# Patient Record
Sex: Female | Born: 1935 | Race: White | Hispanic: No | Marital: Married | Smoking: Never smoker
Health system: Southern US, Community
[De-identification: ages and names within clinical notes are randomized; demographics above are authoritative.]

## PROBLEM LIST (undated history)

## (undated) DIAGNOSIS — I639 Cerebral infarction, unspecified: Secondary | ICD-10-CM

## (undated) DIAGNOSIS — E079 Disorder of thyroid, unspecified: Secondary | ICD-10-CM

## (undated) DIAGNOSIS — E119 Type 2 diabetes mellitus without complications: Secondary | ICD-10-CM

## (undated) DIAGNOSIS — C189 Malignant neoplasm of colon, unspecified: Secondary | ICD-10-CM

## (undated) HISTORY — PX: BACK SURGERY: SHX140

## (undated) HISTORY — PX: COLON SURGERY: SHX602

## (undated) HISTORY — PX: BREAST SURGERY: SHX581

## (undated) HISTORY — PX: TONSILLECTOMY: SUR1361

---

## 2003-08-13 ENCOUNTER — Other Ambulatory Visit: Payer: Self-pay

## 2003-08-14 ENCOUNTER — Other Ambulatory Visit: Payer: Self-pay

## 2004-07-25 ENCOUNTER — Ambulatory Visit: Payer: Self-pay | Admitting: Pain Medicine

## 2004-08-09 ENCOUNTER — Ambulatory Visit: Payer: Self-pay | Admitting: Pain Medicine

## 2004-08-17 ENCOUNTER — Ambulatory Visit: Payer: Self-pay | Admitting: Pain Medicine

## 2004-09-19 ENCOUNTER — Ambulatory Visit: Payer: Self-pay | Admitting: Pain Medicine

## 2004-10-24 ENCOUNTER — Ambulatory Visit: Payer: Self-pay | Admitting: Pain Medicine

## 2004-11-24 ENCOUNTER — Ambulatory Visit: Payer: Self-pay | Admitting: Pain Medicine

## 2005-04-02 ENCOUNTER — Inpatient Hospital Stay: Payer: Self-pay | Admitting: Internal Medicine

## 2005-04-02 ENCOUNTER — Other Ambulatory Visit: Payer: Self-pay

## 2005-04-04 ENCOUNTER — Inpatient Hospital Stay: Payer: Self-pay | Admitting: Unknown Physician Specialty

## 2006-07-25 ENCOUNTER — Ambulatory Visit: Payer: Self-pay | Admitting: Gastroenterology

## 2006-08-13 ENCOUNTER — Inpatient Hospital Stay: Payer: Self-pay | Admitting: Internal Medicine

## 2006-08-13 ENCOUNTER — Other Ambulatory Visit: Payer: Self-pay

## 2006-08-28 ENCOUNTER — Ambulatory Visit: Payer: Self-pay | Admitting: Surgery

## 2006-09-05 ENCOUNTER — Inpatient Hospital Stay: Payer: Self-pay | Admitting: Surgery

## 2006-09-21 ENCOUNTER — Ambulatory Visit: Payer: Self-pay | Admitting: Internal Medicine

## 2006-10-12 ENCOUNTER — Ambulatory Visit: Payer: Self-pay | Admitting: Surgery

## 2006-10-15 ENCOUNTER — Ambulatory Visit: Payer: Self-pay | Admitting: Internal Medicine

## 2006-11-02 ENCOUNTER — Ambulatory Visit: Payer: Self-pay | Admitting: Internal Medicine

## 2006-12-01 ENCOUNTER — Ambulatory Visit: Payer: Self-pay | Admitting: Internal Medicine

## 2007-01-01 ENCOUNTER — Ambulatory Visit: Payer: Self-pay | Admitting: Internal Medicine

## 2007-01-15 ENCOUNTER — Inpatient Hospital Stay: Payer: Self-pay | Admitting: Internal Medicine

## 2007-01-15 ENCOUNTER — Other Ambulatory Visit: Payer: Self-pay

## 2007-01-31 ENCOUNTER — Ambulatory Visit: Payer: Self-pay | Admitting: Internal Medicine

## 2007-03-03 ENCOUNTER — Ambulatory Visit: Payer: Self-pay | Admitting: Internal Medicine

## 2007-04-02 ENCOUNTER — Ambulatory Visit: Payer: Self-pay | Admitting: Internal Medicine

## 2007-04-09 ENCOUNTER — Ambulatory Visit: Payer: Self-pay | Admitting: Internal Medicine

## 2007-05-02 ENCOUNTER — Ambulatory Visit: Payer: Self-pay | Admitting: Family Medicine

## 2007-05-02 ENCOUNTER — Inpatient Hospital Stay: Payer: Self-pay | Admitting: Internal Medicine

## 2007-05-03 ENCOUNTER — Ambulatory Visit: Payer: Self-pay | Admitting: Internal Medicine

## 2007-05-15 ENCOUNTER — Ambulatory Visit: Payer: Self-pay | Admitting: Internal Medicine

## 2007-05-30 ENCOUNTER — Emergency Department: Payer: Self-pay | Admitting: Emergency Medicine

## 2007-06-03 ENCOUNTER — Ambulatory Visit: Payer: Self-pay | Admitting: Internal Medicine

## 2007-07-03 ENCOUNTER — Ambulatory Visit: Payer: Self-pay | Admitting: Internal Medicine

## 2007-08-03 ENCOUNTER — Ambulatory Visit: Payer: Self-pay | Admitting: Internal Medicine

## 2007-08-22 ENCOUNTER — Ambulatory Visit: Payer: Self-pay | Admitting: Internal Medicine

## 2007-09-02 ENCOUNTER — Ambulatory Visit: Payer: Self-pay | Admitting: Internal Medicine

## 2007-10-30 ENCOUNTER — Ambulatory Visit: Payer: Self-pay | Admitting: Gastroenterology

## 2007-11-03 ENCOUNTER — Ambulatory Visit: Payer: Self-pay | Admitting: Internal Medicine

## 2007-12-01 ENCOUNTER — Ambulatory Visit: Payer: Self-pay | Admitting: Internal Medicine

## 2008-01-01 ENCOUNTER — Ambulatory Visit: Payer: Self-pay | Admitting: Internal Medicine

## 2008-01-31 ENCOUNTER — Ambulatory Visit: Payer: Self-pay | Admitting: Internal Medicine

## 2008-02-25 ENCOUNTER — Ambulatory Visit: Payer: Self-pay | Admitting: Internal Medicine

## 2008-03-02 ENCOUNTER — Ambulatory Visit: Payer: Self-pay | Admitting: Internal Medicine

## 2008-04-07 ENCOUNTER — Ambulatory Visit: Payer: Self-pay | Admitting: Internal Medicine

## 2008-05-02 ENCOUNTER — Ambulatory Visit: Payer: Self-pay | Admitting: Internal Medicine

## 2008-06-02 ENCOUNTER — Ambulatory Visit: Payer: Self-pay | Admitting: Internal Medicine

## 2008-07-01 ENCOUNTER — Ambulatory Visit: Payer: Self-pay | Admitting: Internal Medicine

## 2008-07-02 ENCOUNTER — Ambulatory Visit: Payer: Self-pay | Admitting: Internal Medicine

## 2008-08-12 ENCOUNTER — Ambulatory Visit: Payer: Self-pay | Admitting: Internal Medicine

## 2008-09-01 ENCOUNTER — Ambulatory Visit: Payer: Self-pay | Admitting: Internal Medicine

## 2008-09-30 ENCOUNTER — Ambulatory Visit: Payer: Self-pay | Admitting: Internal Medicine

## 2008-10-02 ENCOUNTER — Ambulatory Visit: Payer: Self-pay | Admitting: Internal Medicine

## 2008-11-02 ENCOUNTER — Ambulatory Visit: Payer: Self-pay | Admitting: Internal Medicine

## 2008-11-11 ENCOUNTER — Ambulatory Visit: Payer: Self-pay | Admitting: Internal Medicine

## 2008-11-30 ENCOUNTER — Ambulatory Visit: Payer: Self-pay | Admitting: Internal Medicine

## 2008-12-31 ENCOUNTER — Ambulatory Visit: Payer: Self-pay | Admitting: Internal Medicine

## 2009-01-30 ENCOUNTER — Ambulatory Visit: Payer: Self-pay | Admitting: Internal Medicine

## 2009-06-02 ENCOUNTER — Ambulatory Visit: Payer: Self-pay | Admitting: Internal Medicine

## 2009-06-30 ENCOUNTER — Ambulatory Visit: Payer: Self-pay | Admitting: Internal Medicine

## 2009-07-02 ENCOUNTER — Ambulatory Visit: Payer: Self-pay | Admitting: Internal Medicine

## 2009-09-09 ENCOUNTER — Inpatient Hospital Stay: Payer: Self-pay | Admitting: Internal Medicine

## 2009-11-30 ENCOUNTER — Ambulatory Visit: Payer: Self-pay | Admitting: Internal Medicine

## 2009-12-20 ENCOUNTER — Ambulatory Visit: Payer: Self-pay | Admitting: Internal Medicine

## 2009-12-29 ENCOUNTER — Ambulatory Visit: Payer: Self-pay | Admitting: Internal Medicine

## 2009-12-31 ENCOUNTER — Ambulatory Visit: Payer: Self-pay | Admitting: Internal Medicine

## 2010-03-18 ENCOUNTER — Inpatient Hospital Stay: Payer: Self-pay | Admitting: Internal Medicine

## 2010-06-02 ENCOUNTER — Ambulatory Visit: Payer: Self-pay | Admitting: Internal Medicine

## 2010-07-01 ENCOUNTER — Ambulatory Visit: Payer: Self-pay | Admitting: Internal Medicine

## 2010-07-02 ENCOUNTER — Ambulatory Visit: Payer: Self-pay | Admitting: Internal Medicine

## 2011-01-10 ENCOUNTER — Emergency Department: Payer: Self-pay | Admitting: Emergency Medicine

## 2011-01-12 ENCOUNTER — Inpatient Hospital Stay: Payer: Self-pay | Admitting: Internal Medicine

## 2011-01-25 ENCOUNTER — Ambulatory Visit: Payer: Self-pay | Admitting: Internal Medicine

## 2011-01-27 ENCOUNTER — Inpatient Hospital Stay: Payer: Self-pay | Admitting: Surgery

## 2011-02-13 ENCOUNTER — Ambulatory Visit: Payer: Self-pay | Admitting: Surgery

## 2011-02-22 ENCOUNTER — Inpatient Hospital Stay: Payer: Self-pay | Admitting: Internal Medicine

## 2011-02-23 LAB — CEA: CEA: 2.7 ng/mL (ref 0.0–4.7)

## 2011-03-13 ENCOUNTER — Ambulatory Visit: Payer: Self-pay | Admitting: Geriatric Medicine

## 2011-04-07 ENCOUNTER — Ambulatory Visit: Payer: Self-pay | Admitting: Internal Medicine

## 2011-04-08 LAB — CEA: CEA: 4 ng/mL (ref 0.0–4.7)

## 2011-05-03 ENCOUNTER — Ambulatory Visit: Payer: Self-pay | Admitting: Internal Medicine

## 2011-06-06 ENCOUNTER — Emergency Department: Payer: Self-pay | Admitting: *Deleted

## 2011-06-12 IMAGING — CT CT HEAD WITHOUT CONTRAST
2 series · 16 of 30 positions shown, 20 images · non-contrast
Comparison: none

REASON FOR EXAM: weakness
COMMENTS:

[Series 2: without · axial · non-contrast · 0.43mm/px · z∈[+244,+378]mm · 13 of 33 slices shown, 17 images]
[im 3/33  brain]
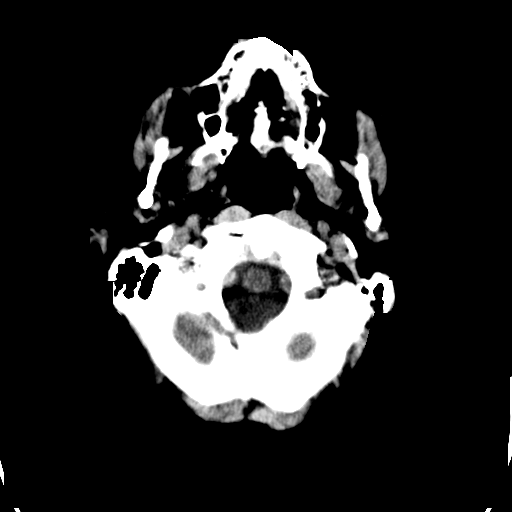
[im 3/33  bone]
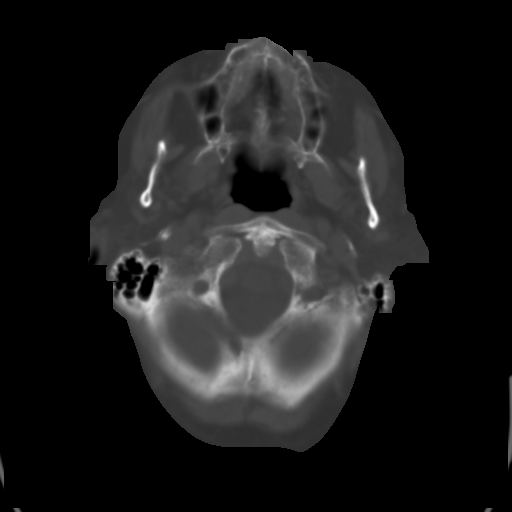
[im 5/33  brain]
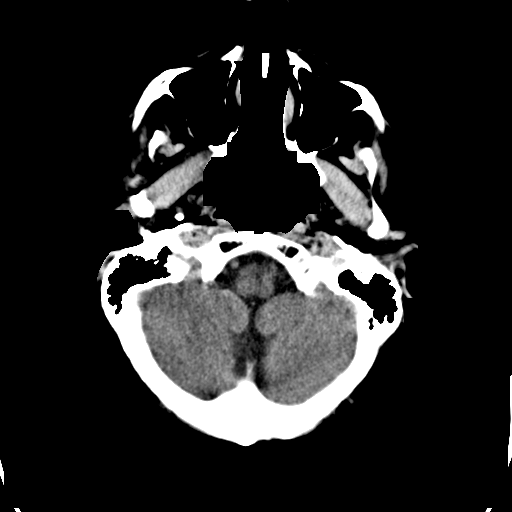
[im 7/33  brain]
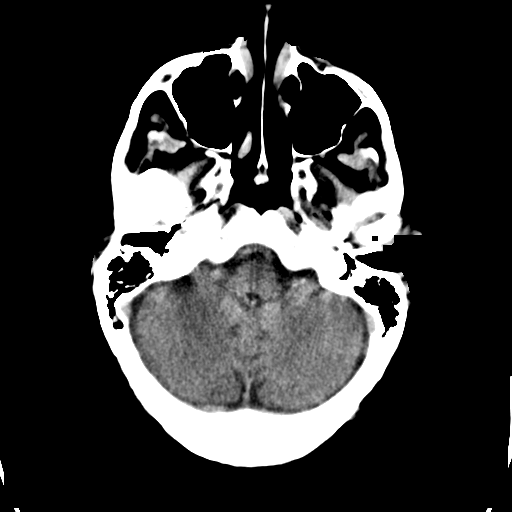
[im 10/33  brain]
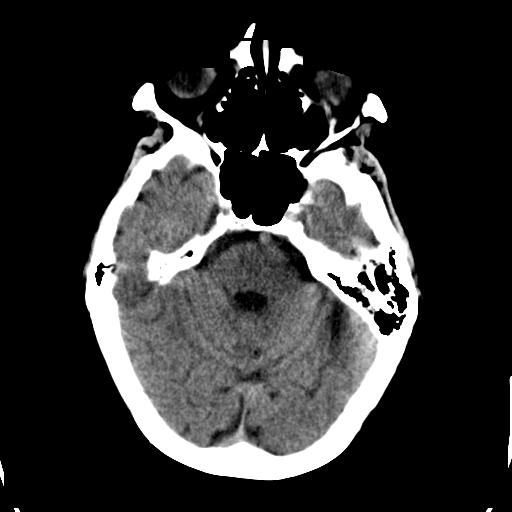
[im 12/33  brain]
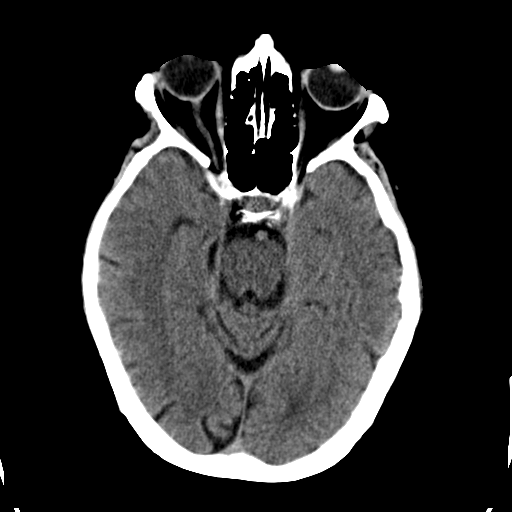
[im 12/33  bone]
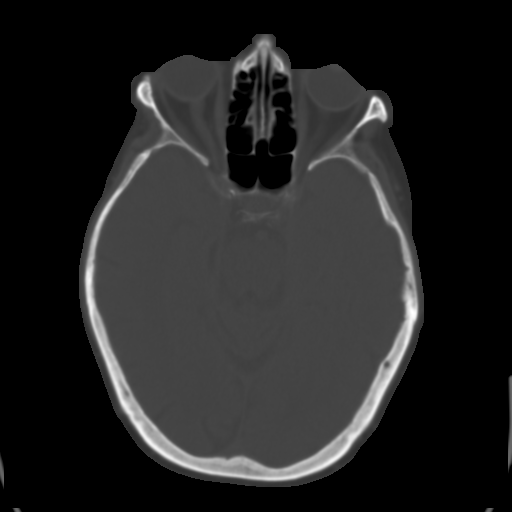
[im 14/33  brain]
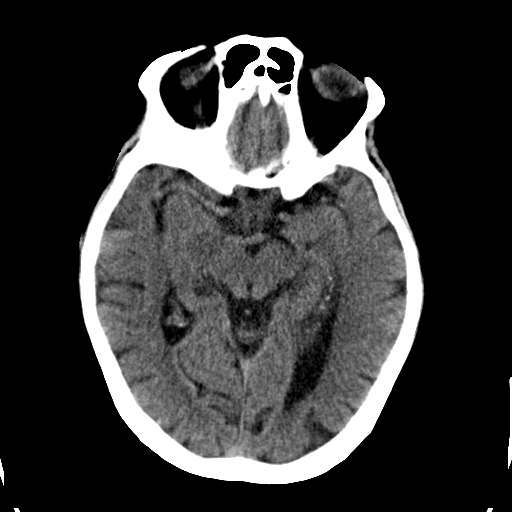
[im 17/33  brain]
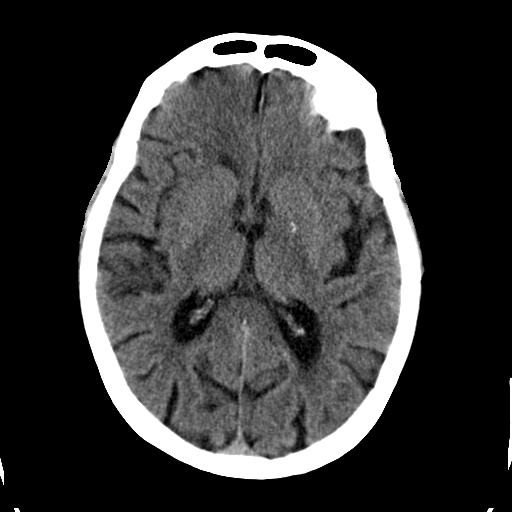
[im 19/33  brain]
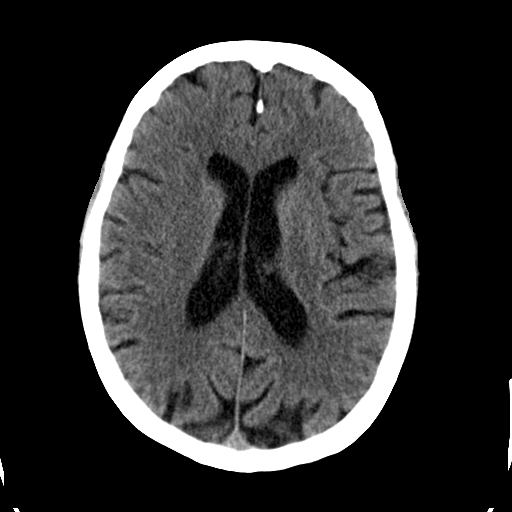
[im 21/33  brain]
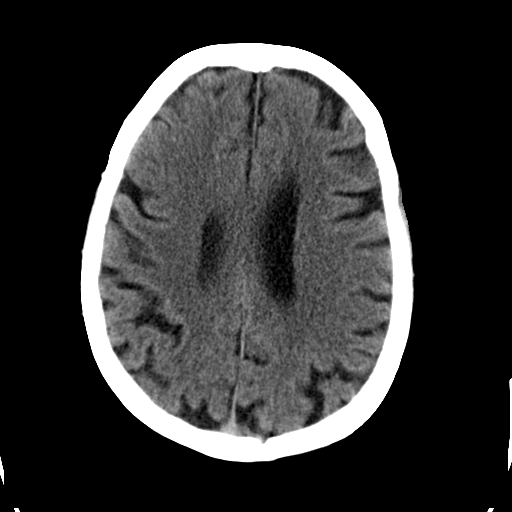
[im 21/33  bone]
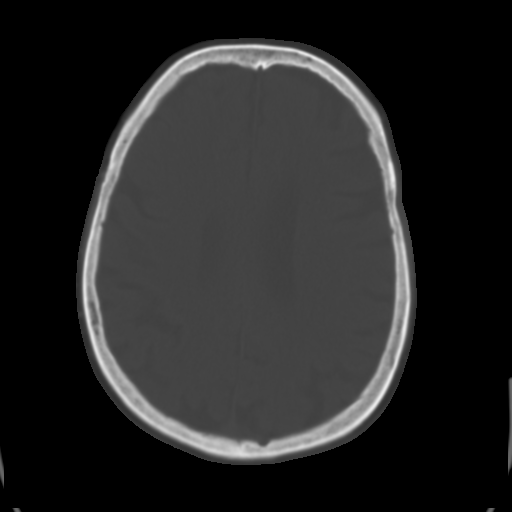
[im 23/33  brain]
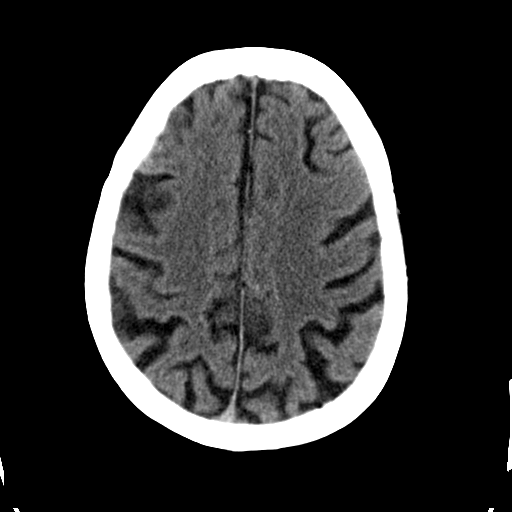
[im 26/33  brain]
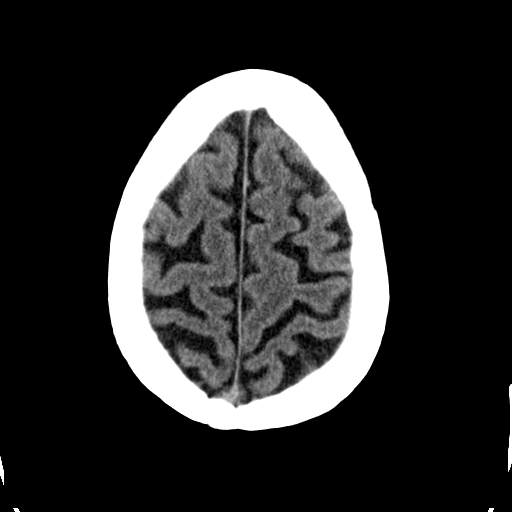
[im 28/33  brain]
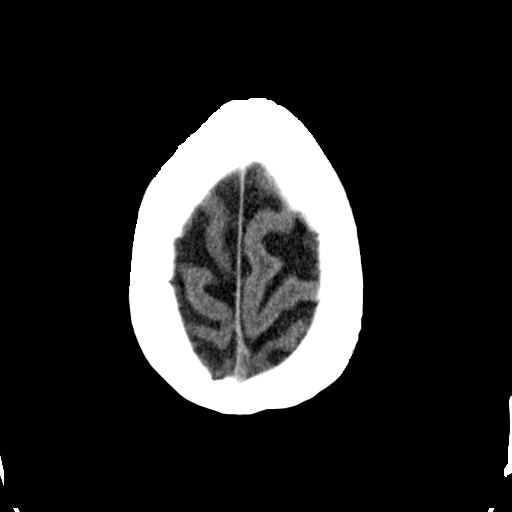
[im 30/33  brain]
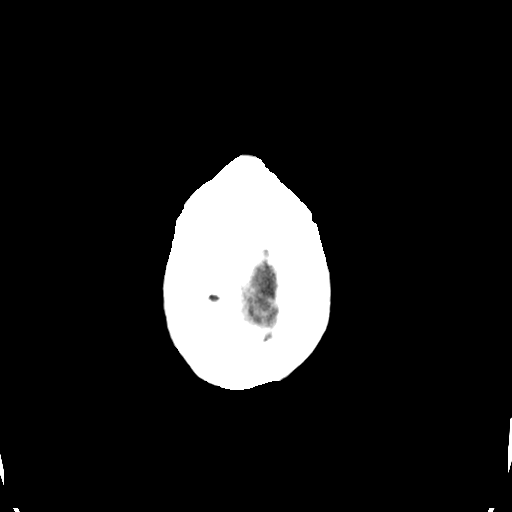
[im 30/33  bone]
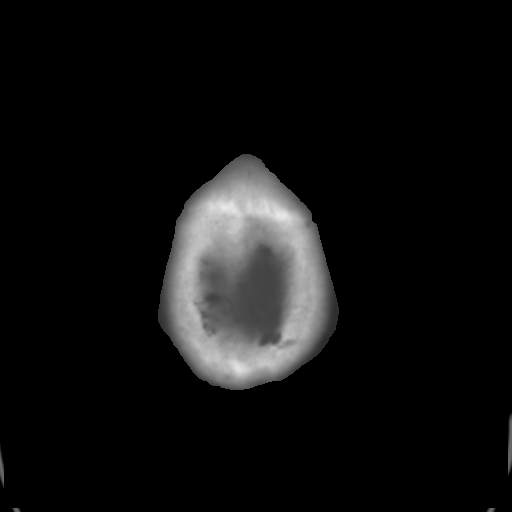

[Series 3: bone · axial · 0.43mm/px · z∈[+244,+288]mm · 3 of 33 slices shown]
[im 3/33  bone]
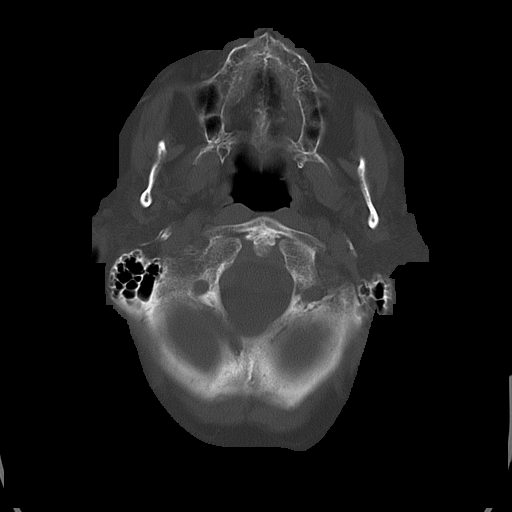
[im 7/33  bone]
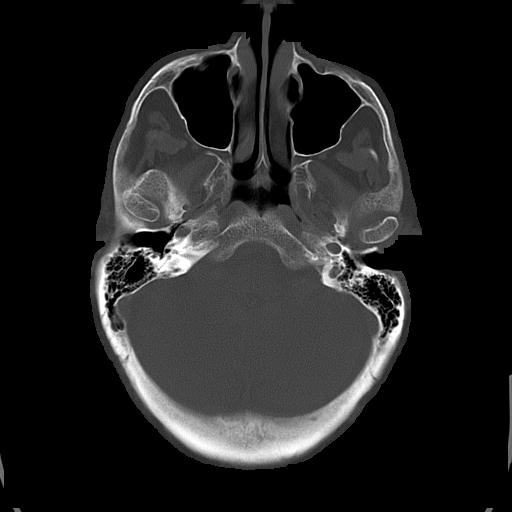
[im 12/33  bone]
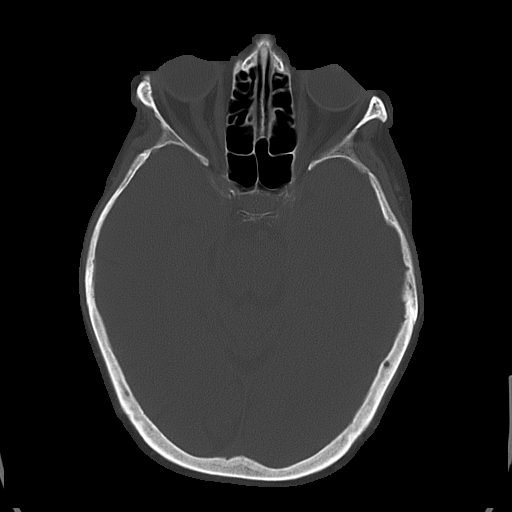

[16 of 30 positions shown; findings below may reference images not displayed]

PROCEDURE:     CT  - CT HEAD WITHOUT CONTRAST  - January 10, 2011  [DATE]

RESULT:     Noncontrast emergent CT of the brain demonstrates prominence of
the ventricles and sulci. Low-attenuation is present diffusely within the
periventricular and subcortical white matter most consistent with chronic
small vessel ischemic disease. No territorial infarct, hemorrhage, mass
effect or midline shift is evident. The included sinuses and mastoid air
cells demonstrate normal appearing aeration. No bony destruction or
depressed skull fracture is evident.
IMPRESSION: 1. Changes of atrophy with chronic small vessel ischemic disease. No acute
intracranial abnormality evident by CT at this time.

## 2011-06-14 ENCOUNTER — Emergency Department: Payer: Self-pay | Admitting: Emergency Medicine

## 2011-06-15 ENCOUNTER — Observation Stay: Payer: Self-pay | Admitting: Internal Medicine

## 2011-06-15 IMAGING — US ABDOMEN ULTRASOUND
1 series · 17 of 25 positions shown · non-contrast
Comparison: none

REASON FOR EXAM: hx of colon cancer
COMMENTS:

[Series 1: abdomen ultrasound · 17 of 60 slices shown]
[im 1/60]
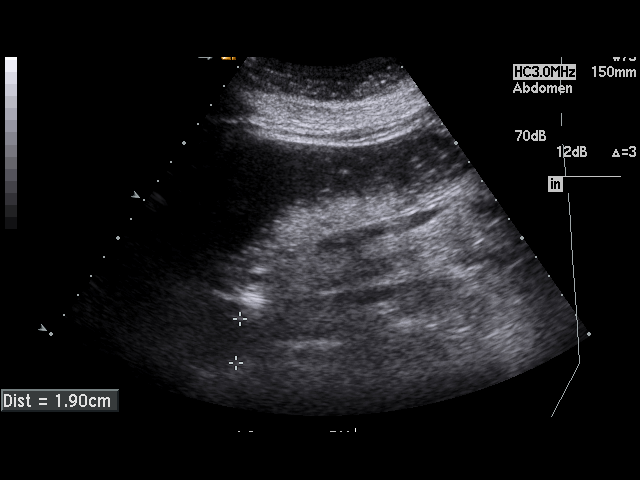
[im 5/60]
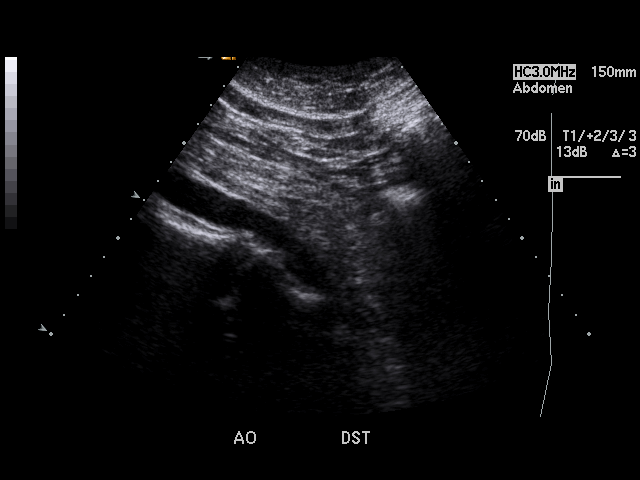
[im 8/60]
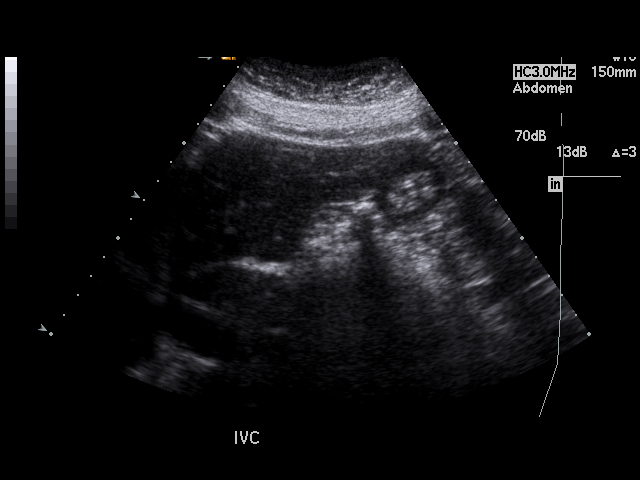
[im 13/60]
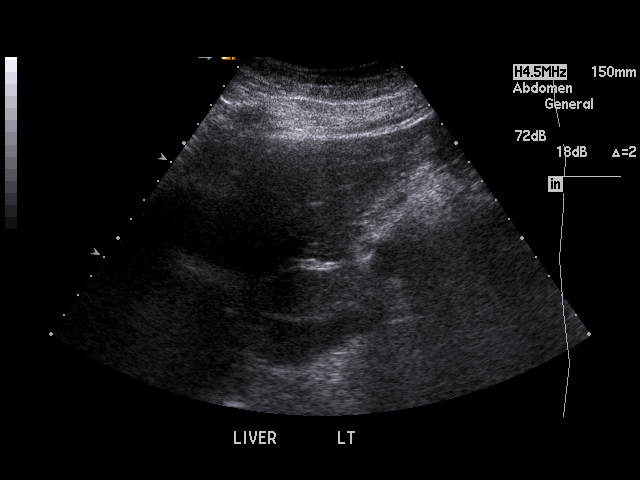
[im 15/60]
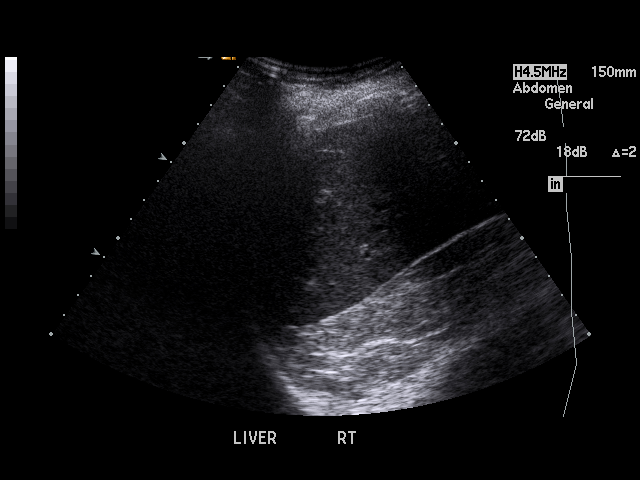
[im 20/60]
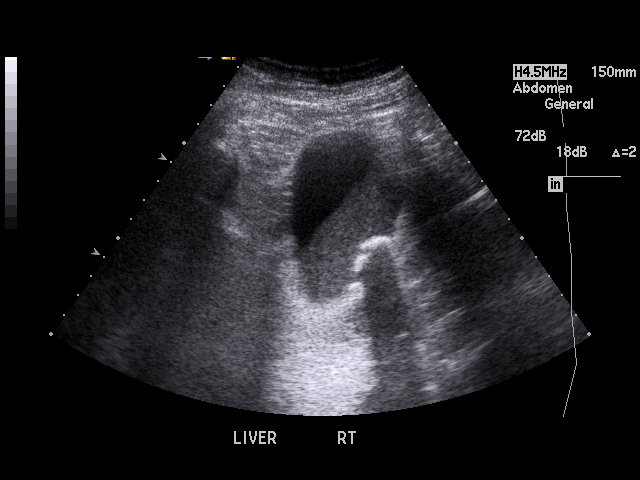
[im 23/60]
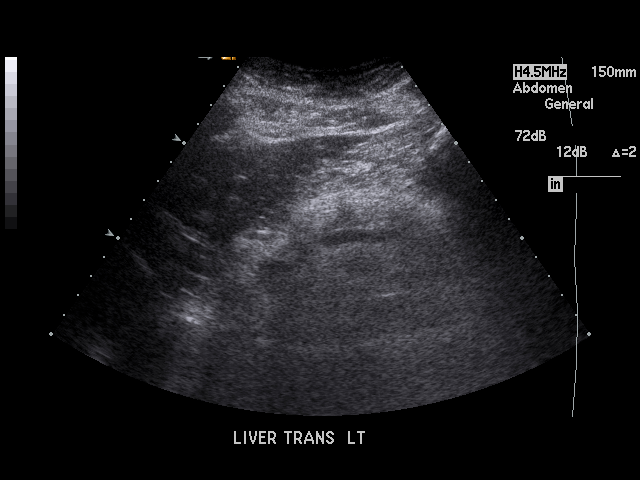
[im 28/60]
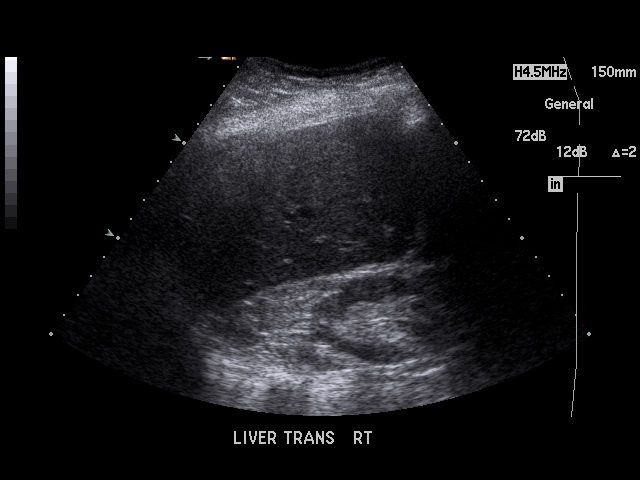
[im 30/60]
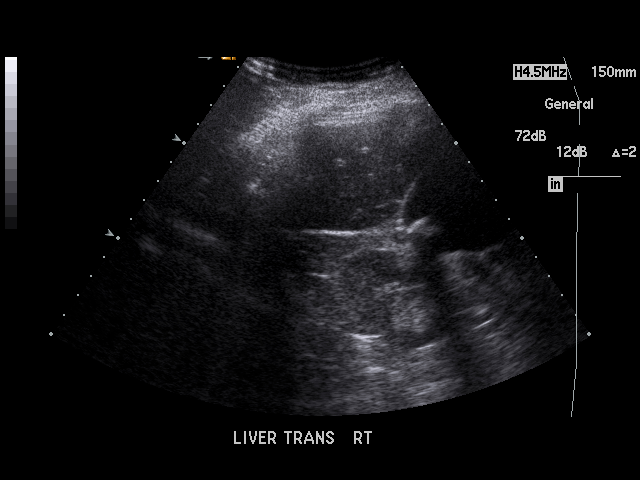
[im 32/60]
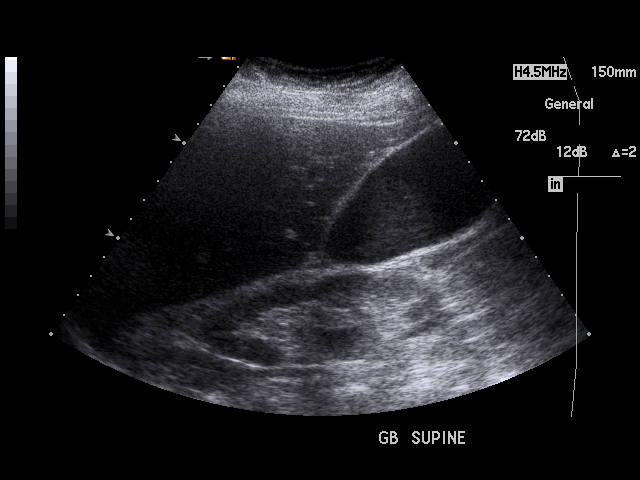
[im 37/60]
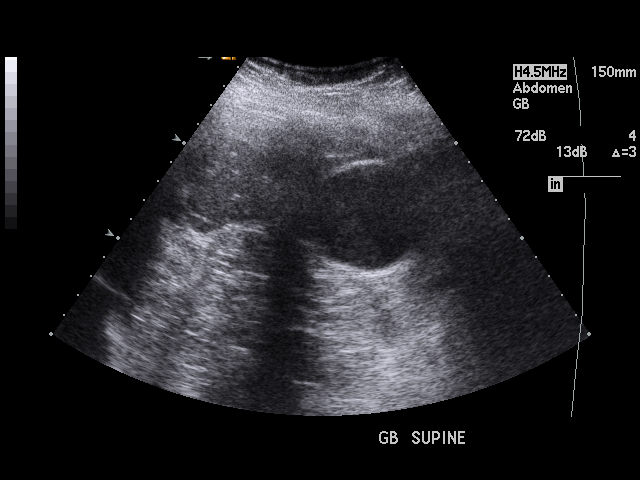
[im 40/60]
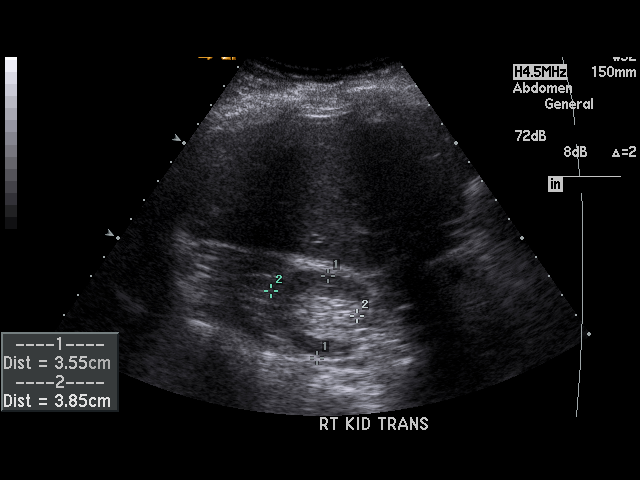
[im 45/60]
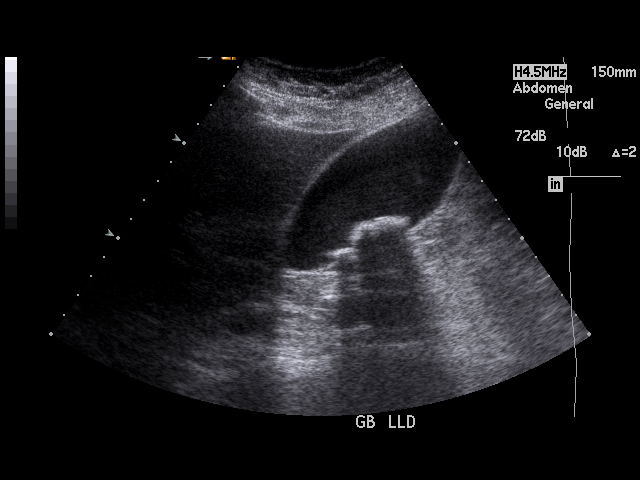
[im 47/60]
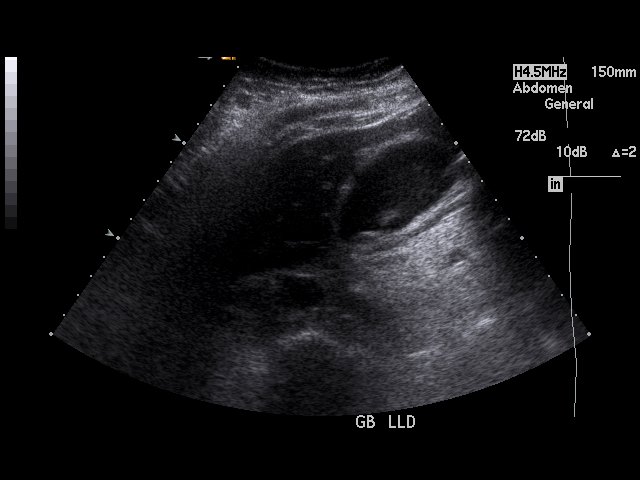
[im 52/60]
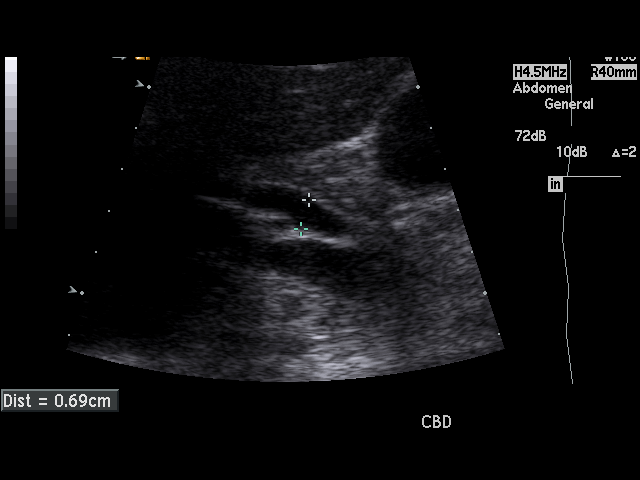
[im 55/60]
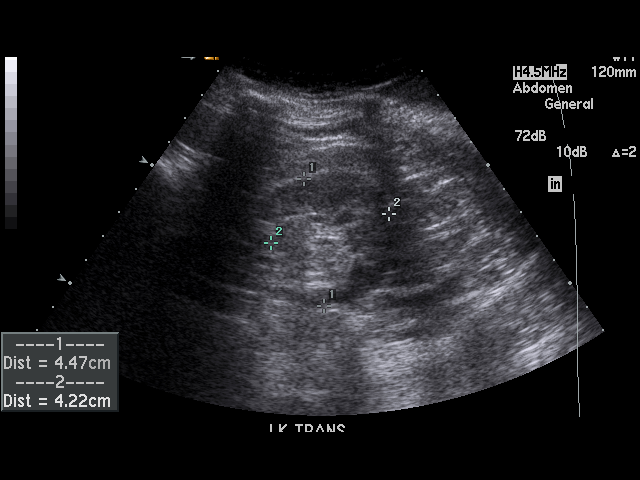
[im 60/60]
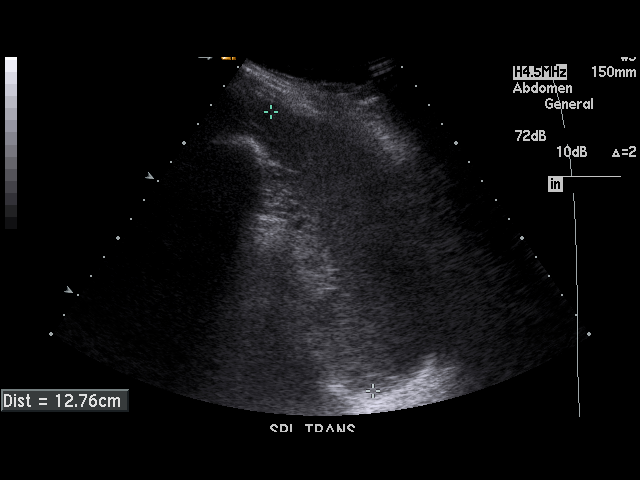

[17 of 25 positions shown; findings below may reference images not displayed]

PROCEDURE:     US  - US ABDOMEN GENERAL SURVEY  - January 13, 2011  [DATE]

RESULT:     The liver exhibits normal echotexture with no focal mass nor
ductal dilation. Portal venous flow is normal in direction toward the liver.
The gallbladder is mildly distended and contains echogenic mobile shadowing
stones as well as sludge. There is no positive sonographic Murphy's sign.
The gallbladder wall is borderline thickened at 3 mm. The common bile duct
is borderline dilated at 6.9 mm.

The pancreas, abdominal aorta, kidneys and inferior vena cava, are normal in
appearance.
IMPRESSION: There are gallstones and sludge present. The common bile
duct is top normal in size. I see no positive sonographic Murphy's sign.

## 2011-08-23 ENCOUNTER — Emergency Department: Payer: Self-pay | Admitting: Emergency Medicine

## 2011-10-25 ENCOUNTER — Ambulatory Visit: Payer: Self-pay | Admitting: Internal Medicine

## 2011-11-03 ENCOUNTER — Ambulatory Visit: Payer: Self-pay | Admitting: Oncology

## 2011-11-03 ENCOUNTER — Ambulatory Visit: Payer: Self-pay | Admitting: Internal Medicine

## 2011-11-08 LAB — COMPREHENSIVE METABOLIC PANEL
Alkaline Phosphatase: 91 U/L (ref 50–136)
Anion Gap: 10 (ref 7–16)
Bilirubin,Total: 1.1 mg/dL — ABNORMAL HIGH (ref 0.2–1.0)
Chloride: 102 mmol/L (ref 98–107)
Co2: 26 mmol/L (ref 21–32)
Creatinine: 1.16 mg/dL (ref 0.60–1.30)
Sodium: 138 mmol/L (ref 136–145)

## 2011-11-08 LAB — CBC CANCER CENTER
Eosinophil #: 0 x10 3/mm (ref 0.0–0.7)
Eosinophil %: 0 %
HCT: 36.7 % (ref 35.0–47.0)
Lymphocyte #: 1.4 x10 3/mm (ref 1.0–3.6)
MCH: 35.5 pg — ABNORMAL HIGH (ref 26.0–34.0)
MCV: 100 fL (ref 80–100)
Monocyte #: 0.4 x10 3/mm (ref 0.0–0.7)
Monocyte %: 5.5 %
Neutrophil #: 5.5 x10 3/mm (ref 1.4–6.5)
Platelet: 228 x10 3/mm (ref 150–440)
RBC: 3.69 10*6/uL — ABNORMAL LOW (ref 3.80–5.20)
RDW: 16.2 % — ABNORMAL HIGH (ref 11.5–14.5)

## 2011-11-09 LAB — CEA: CEA: 5 ng/mL — ABNORMAL HIGH (ref 0.0–4.7)

## 2011-12-01 ENCOUNTER — Ambulatory Visit: Payer: Self-pay | Admitting: Oncology

## 2011-12-01 ENCOUNTER — Ambulatory Visit: Payer: Self-pay | Admitting: Internal Medicine

## 2012-02-17 ENCOUNTER — Emergency Department: Payer: Self-pay | Admitting: *Deleted

## 2012-02-17 LAB — COMPREHENSIVE METABOLIC PANEL
Alkaline Phosphatase: 78 U/L (ref 50–136)
Anion Gap: 8 (ref 7–16)
Bilirubin,Total: 0.9 mg/dL (ref 0.2–1.0)
Chloride: 107 mmol/L (ref 98–107)
Co2: 26 mmol/L (ref 21–32)
Creatinine: 1.14 mg/dL (ref 0.60–1.30)
EGFR (African American): 53 — ABNORMAL LOW
EGFR (Non-African Amer.): 46 — ABNORMAL LOW
Glucose: 94 mg/dL (ref 65–99)
Osmolality: 284 (ref 275–301)
Potassium: 4.2 mmol/L (ref 3.5–5.1)
SGOT(AST): 24 U/L (ref 15–37)
Sodium: 141 mmol/L (ref 136–145)
Total Protein: 7 g/dL (ref 6.4–8.2)

## 2012-02-17 LAB — URINALYSIS, COMPLETE
Bilirubin,UR: NEGATIVE
Blood: NEGATIVE
Nitrite: POSITIVE
Ph: 5 (ref 4.5–8.0)
Protein: NEGATIVE
RBC,UR: <1 /HPF (ref 0–5)
Squamous Epithelial: 1
WBC UR: 1 /HPF (ref 0–5)

## 2012-02-17 LAB — CBC WITH DIFFERENTIAL/PLATELET
Basophil #: 0 10*3/uL (ref 0.0–0.1)
Basophil %: 0.1 %
Eosinophil %: 0 %
HCT: 36.3 % (ref 35.0–47.0)
HGB: 12.9 g/dL (ref 12.0–16.0)
Lymphocyte #: 1.2 10*3/uL (ref 1.0–3.6)
Lymphocyte %: 19.7 %
MCH: 35.9 pg — ABNORMAL HIGH (ref 26.0–34.0)
MCV: 101 fL — ABNORMAL HIGH (ref 80–100)
Monocyte #: 0.5 x10 3/mm (ref 0.2–0.9)
Neutrophil #: 4.1 10*3/uL (ref 1.4–6.5)
Neutrophil %: 71 %
Platelet: 182 10*3/uL (ref 150–440)
RBC: 3.6 10*6/uL — ABNORMAL LOW (ref 3.80–5.20)
RDW: 13.4 % (ref 11.5–14.5)

## 2012-02-17 LAB — PRO B NATRIURETIC PEPTIDE: B-Type Natriuretic Peptide: 126 pg/mL (ref 0–450)

## 2012-02-17 LAB — TROPONIN I: Troponin-I: <0.02 ng/mL

## 2012-03-02 DIAGNOSIS — G939 Disorder of brain, unspecified: Secondary | ICD-10-CM | POA: Insufficient documentation

## 2012-03-11 ENCOUNTER — Observation Stay: Payer: Self-pay | Admitting: Internal Medicine

## 2012-03-11 LAB — CBC
HCT: 36 % (ref 35.0–47.0)
HGB: 12.8 g/dL (ref 12.0–16.0)
MCHC: 35.5 g/dL (ref 32.0–36.0)
MCV: 100 fL (ref 80–100)
Platelet: 141 10*3/uL — ABNORMAL LOW (ref 150–440)
RBC: 3.61 10*6/uL — ABNORMAL LOW (ref 3.80–5.20)
RDW: 13.3 % (ref 11.5–14.5)
WBC: 5.2 10*3/uL (ref 3.6–11.0)

## 2012-03-11 LAB — TROPONIN I: Troponin-I: <0.02 ng/mL

## 2012-03-11 LAB — HEMOGLOBIN A1C: Hemoglobin A1C: 3.9 % — ABNORMAL LOW (ref 4.2–6.3)

## 2012-03-11 LAB — URINALYSIS, COMPLETE
Bilirubin,UR: NEGATIVE
Glucose,UR: NEGATIVE mg/dL (ref 0–75)
Ketone: NEGATIVE
Leukocyte Esterase: NEGATIVE
Nitrite: NEGATIVE
Ph: 6 (ref 4.5–8.0)
Protein: NEGATIVE
Squamous Epithelial: 1
WBC UR: NONE SEEN /HPF (ref 0–5)

## 2012-03-11 LAB — BASIC METABOLIC PANEL
Anion Gap: 8 (ref 7–16)
BUN: 19 mg/dL — ABNORMAL HIGH (ref 7–18)
Chloride: 106 mmol/L (ref 98–107)
Co2: 24 mmol/L (ref 21–32)
Creatinine: 0.89 mg/dL (ref 0.60–1.30)
EGFR (African American): >60
EGFR (Non-African Amer.): >60
Glucose: 107 mg/dL — ABNORMAL HIGH (ref 65–99)
Osmolality: 278 (ref 275–301)
Sodium: 138 mmol/L (ref 136–145)

## 2012-03-12 LAB — LIPID PANEL
Cholesterol: 188 mg/dL (ref 0–200)
Ldl Cholesterol, Calc: 118 mg/dL — ABNORMAL HIGH (ref 0–100)

## 2012-03-20 ENCOUNTER — Encounter: Payer: Self-pay | Admitting: Internal Medicine

## 2012-04-01 ENCOUNTER — Encounter: Payer: Self-pay | Admitting: Internal Medicine

## 2012-05-01 ENCOUNTER — Emergency Department: Payer: Self-pay | Admitting: Emergency Medicine

## 2012-05-01 LAB — COMPREHENSIVE METABOLIC PANEL
Albumin: 4.1 g/dL (ref 3.4–5.0)
Alkaline Phosphatase: 68 U/L (ref 50–136)
BUN: 20 mg/dL — ABNORMAL HIGH (ref 7–18)
Bilirubin,Total: 1.3 mg/dL — ABNORMAL HIGH (ref 0.2–1.0)
Chloride: 102 mmol/L (ref 98–107)
Co2: 26 mmol/L (ref 21–32)
Creatinine: 1.13 mg/dL (ref 0.60–1.30)
EGFR (Non-African Amer.): 46 — ABNORMAL LOW
SGPT (ALT): 10 U/L — ABNORMAL LOW
Sodium: 137 mmol/L (ref 136–145)

## 2012-05-01 LAB — URINALYSIS, COMPLETE
Bacteria: NONE SEEN
Bilirubin,UR: NEGATIVE
Glucose,UR: NEGATIVE mg/dL (ref 0–75)
Ketone: NEGATIVE
Leukocyte Esterase: NEGATIVE
Nitrite: NEGATIVE
RBC,UR: <1 /HPF (ref 0–5)
Specific Gravity: 1.014 (ref 1.003–1.030)
Squamous Epithelial: 1
Transitional Epi: <1
WBC UR: 1 /HPF (ref 0–5)

## 2012-05-01 LAB — CBC
HCT: 35.4 % (ref 35.0–47.0)
HGB: 13 g/dL (ref 12.0–16.0)
MCH: 36.5 pg — ABNORMAL HIGH (ref 26.0–34.0)
MCV: 99 fL (ref 80–100)
RBC: 3.56 10*6/uL — ABNORMAL LOW (ref 3.80–5.20)

## 2012-05-01 LAB — TROPONIN I: Troponin-I: <0.02 ng/mL

## 2012-05-16 ENCOUNTER — Ambulatory Visit: Payer: Self-pay | Admitting: Oncology

## 2012-05-16 LAB — CBC CANCER CENTER
Basophil %: 0.1 %
Eosinophil #: 0 x10 3/mm (ref 0.0–0.7)
Eosinophil %: 0 %
HCT: 39 % (ref 35.0–47.0)
Lymphocyte #: 1.3 x10 3/mm (ref 1.0–3.6)
MCH: 35.6 pg — ABNORMAL HIGH (ref 26.0–34.0)
MCHC: 35 g/dL (ref 32.0–36.0)
Monocyte #: 0.6 x10 3/mm (ref 0.2–0.9)
Monocyte %: 8.1 %
Neutrophil %: 74.8 %
Platelet: 220 x10 3/mm (ref 150–440)
RBC: 3.84 10*6/uL (ref 3.80–5.20)
WBC: 7.8 x10 3/mm (ref 3.6–11.0)

## 2012-05-16 LAB — COMPREHENSIVE METABOLIC PANEL
Anion Gap: 9 (ref 7–16)
Calcium, Total: 9.7 mg/dL (ref 8.5–10.1)
Chloride: 102 mmol/L (ref 98–107)
Co2: 26 mmol/L (ref 21–32)
EGFR (African American): 49 — ABNORMAL LOW
Glucose: 110 mg/dL — ABNORMAL HIGH (ref 65–99)
Potassium: 4.6 mmol/L (ref 3.5–5.1)
SGOT(AST): 17 U/L (ref 15–37)
Sodium: 137 mmol/L (ref 136–145)
Total Protein: 7.7 g/dL (ref 6.4–8.2)

## 2012-05-20 ENCOUNTER — Encounter: Payer: Self-pay | Admitting: Internal Medicine

## 2012-06-02 ENCOUNTER — Ambulatory Visit: Payer: Self-pay | Admitting: Oncology

## 2012-06-02 ENCOUNTER — Encounter: Payer: Self-pay | Admitting: Internal Medicine

## 2012-10-11 ENCOUNTER — Emergency Department: Payer: Self-pay | Admitting: Unknown Physician Specialty

## 2012-10-11 LAB — COMPREHENSIVE METABOLIC PANEL
Albumin: 4.1 g/dL (ref 3.4–5.0)
Anion Gap: 7 (ref 7–16)
Calcium, Total: 9.6 mg/dL (ref 8.5–10.1)
Co2: 25 mmol/L (ref 21–32)
Creatinine: 1.14 mg/dL (ref 0.60–1.30)
EGFR (Non-African Amer.): 46 — ABNORMAL LOW
Osmolality: 282 (ref 275–301)
Potassium: 4.5 mmol/L (ref 3.5–5.1)
Total Protein: 7.1 g/dL (ref 6.4–8.2)

## 2012-10-11 LAB — CBC
HGB: 12.9 g/dL (ref 12.0–16.0)
MCV: 98 fL (ref 80–100)
RBC: 3.75 10*6/uL — ABNORMAL LOW (ref 3.80–5.20)
RDW: 14.3 % (ref 11.5–14.5)
WBC: 6.9 10*3/uL (ref 3.6–11.0)

## 2012-10-12 LAB — URINALYSIS, COMPLETE
Bilirubin,UR: NEGATIVE
Blood: NEGATIVE
Glucose,UR: NEGATIVE mg/dL (ref 0–75)
Nitrite: NEGATIVE
Specific Gravity: 1.02 (ref 1.003–1.030)
WBC UR: 1 /HPF (ref 0–5)

## 2013-05-02 ENCOUNTER — Ambulatory Visit: Payer: Self-pay | Admitting: Oncology

## 2013-05-15 LAB — COMPREHENSIVE METABOLIC PANEL
Albumin: 4.1 g/dL (ref 3.4–5.0)
Alkaline Phosphatase: 67 U/L (ref 50–136)
Chloride: 107 mmol/L (ref 98–107)
Co2: 27 mmol/L (ref 21–32)
Creatinine: 1.31 mg/dL — ABNORMAL HIGH (ref 0.60–1.30)
EGFR (Non-African Amer.): 38 — ABNORMAL LOW
Osmolality: 285 (ref 275–301)
SGOT(AST): 12 U/L — ABNORMAL LOW (ref 15–37)
SGPT (ALT): 9 U/L — ABNORMAL LOW (ref 12–78)
Total Protein: 6.9 g/dL (ref 6.4–8.2)

## 2013-05-15 LAB — CBC CANCER CENTER
Basophil %: 0.3 %
Eosinophil #: 0 x10 3/mm (ref 0.0–0.7)
Eosinophil %: 0 %
HCT: 38.1 % (ref 35.0–47.0)
Lymphocyte #: 1.2 x10 3/mm (ref 1.0–3.6)
MCHC: 36.4 g/dL — ABNORMAL HIGH (ref 32.0–36.0)
Monocyte #: 0.6 x10 3/mm (ref 0.2–0.9)
Monocyte %: 10.3 %
Platelet: 196 x10 3/mm (ref 150–440)
RBC: 3.85 10*6/uL (ref 3.80–5.20)
RDW: 13.9 % (ref 11.5–14.5)
WBC: 6 x10 3/mm (ref 3.6–11.0)

## 2013-06-02 ENCOUNTER — Ambulatory Visit: Payer: Self-pay | Admitting: Oncology

## 2013-06-03 ENCOUNTER — Emergency Department: Payer: Self-pay | Admitting: Emergency Medicine

## 2013-06-03 LAB — URINALYSIS, COMPLETE
Blood: NEGATIVE
Glucose,UR: NEGATIVE mg/dL (ref 0–75)
Hyaline Cast: 4
Leukocyte Esterase: NEGATIVE
Nitrite: NEGATIVE
Ph: 5 (ref 4.5–8.0)
Protein: NEGATIVE
Specific Gravity: 1.021 (ref 1.003–1.030)
WBC UR: 1 /HPF (ref 0–5)

## 2013-06-03 LAB — CBC
HCT: 36 % (ref 35.0–47.0)
HGB: 13.2 g/dL (ref 12.0–16.0)
MCH: 35.4 pg — ABNORMAL HIGH (ref 26.0–34.0)
MCV: 97 fL (ref 80–100)
RBC: 3.73 10*6/uL — ABNORMAL LOW (ref 3.80–5.20)
RDW: 14.2 % (ref 11.5–14.5)
WBC: 7.3 10*3/uL (ref 3.6–11.0)

## 2013-06-03 LAB — COMPREHENSIVE METABOLIC PANEL
Albumin: 3.9 g/dL (ref 3.4–5.0)
Anion Gap: 6 — ABNORMAL LOW (ref 7–16)
BUN: 17 mg/dL (ref 7–18)
Bilirubin,Total: 1.2 mg/dL — ABNORMAL HIGH (ref 0.2–1.0)
Creatinine: 1.09 mg/dL (ref 0.60–1.30)
Glucose: 98 mg/dL (ref 65–99)
SGOT(AST): 23 U/L (ref 15–37)
SGPT (ALT): 13 U/L (ref 12–78)
Sodium: 139 mmol/L (ref 136–145)

## 2013-06-03 LAB — TROPONIN I: Troponin-I: <0.02 ng/mL

## 2013-06-04 ENCOUNTER — Ambulatory Visit: Payer: Self-pay | Admitting: Oncology

## 2013-11-11 ENCOUNTER — Emergency Department: Payer: Self-pay | Admitting: Emergency Medicine

## 2013-11-11 LAB — COMPREHENSIVE METABOLIC PANEL
ALBUMIN: 4.2 g/dL (ref 3.4–5.0)
ANION GAP: 7 (ref 7–16)
AST: 16 U/L (ref 15–37)
Alkaline Phosphatase: 72 U/L
BILIRUBIN TOTAL: 1.4 mg/dL — AB (ref 0.2–1.0)
BUN: 19 mg/dL — AB (ref 7–18)
CALCIUM: 9.5 mg/dL (ref 8.5–10.1)
CHLORIDE: 106 mmol/L (ref 98–107)
Co2: 25 mmol/L (ref 21–32)
Creatinine: 1.26 mg/dL (ref 0.60–1.30)
EGFR (African American): 47 — ABNORMAL LOW
EGFR (Non-African Amer.): 40 — ABNORMAL LOW
Glucose: 119 mg/dL — ABNORMAL HIGH (ref 65–99)
OSMOLALITY: 279 (ref 275–301)
POTASSIUM: 4 mmol/L (ref 3.5–5.1)
SGPT (ALT): 10 U/L — ABNORMAL LOW (ref 12–78)
SODIUM: 138 mmol/L (ref 136–145)
Total Protein: 7.2 g/dL (ref 6.4–8.2)

## 2013-11-11 LAB — URINALYSIS, COMPLETE
BILIRUBIN, UR: NEGATIVE
Blood: NEGATIVE
Glucose,UR: NEGATIVE mg/dL (ref 0–75)
Ketone: NEGATIVE
Leukocyte Esterase: NEGATIVE
NITRITE: POSITIVE
PH: 5 (ref 4.5–8.0)
Protein: NEGATIVE
RBC,UR: <1 /HPF (ref 0–5)
Specific Gravity: 1.018 (ref 1.003–1.030)
Squamous Epithelial: 1
WBC UR: <1 /HPF (ref 0–5)

## 2013-11-11 LAB — CBC WITH DIFFERENTIAL/PLATELET
BASOS PCT: 0.1 %
Basophil #: 0 10*3/uL (ref 0.0–0.1)
EOS ABS: 0 10*3/uL (ref 0.0–0.7)
Eosinophil %: 0 %
HCT: 38.2 % (ref 35.0–47.0)
HGB: 13.8 g/dL (ref 12.0–16.0)
LYMPHS ABS: 1 10*3/uL (ref 1.0–3.6)
LYMPHS PCT: 15.7 %
MCH: 36 pg — ABNORMAL HIGH (ref 26.0–34.0)
MCHC: 36.2 g/dL — ABNORMAL HIGH (ref 32.0–36.0)
MCV: 100 fL (ref 80–100)
Monocyte #: 0.7 x10 3/mm (ref 0.2–0.9)
Monocyte %: 11.8 %
Neutrophil #: 4.6 10*3/uL (ref 1.4–6.5)
Neutrophil %: 72.4 %
Platelet: 202 10*3/uL (ref 150–440)
RBC: 3.84 10*6/uL (ref 3.80–5.20)
RDW: 14.6 % — ABNORMAL HIGH (ref 11.5–14.5)
WBC: 6.4 10*3/uL (ref 3.6–11.0)

## 2013-11-11 LAB — TROPONIN I: Troponin-I: <0.02 ng/mL

## 2013-11-13 LAB — URINE CULTURE

## 2014-01-05 DIAGNOSIS — M76899 Other specified enthesopathies of unspecified lower limb, excluding foot: Secondary | ICD-10-CM | POA: Insufficient documentation

## 2014-03-14 ENCOUNTER — Emergency Department: Payer: Self-pay | Admitting: Emergency Medicine

## 2014-03-14 LAB — URINALYSIS, COMPLETE
Bilirubin,UR: NEGATIVE
Blood: NEGATIVE
Glucose,UR: NEGATIVE mg/dL (ref 0–75)
LEUKOCYTE ESTERASE: NEGATIVE
NITRITE: NEGATIVE
PH: 5 (ref 4.5–8.0)
PROTEIN: NEGATIVE
RBC,UR: <1 /HPF (ref 0–5)
Specific Gravity: 1.023 (ref 1.003–1.030)
Squamous Epithelial: 1
WBC UR: 1 /HPF (ref 0–5)

## 2014-03-14 LAB — CBC
HCT: 35.7 % (ref 35.0–47.0)
HGB: 12.6 g/dL (ref 12.0–16.0)
MCH: 35.8 pg — ABNORMAL HIGH (ref 26.0–34.0)
MCHC: 35.3 g/dL (ref 32.0–36.0)
MCV: 102 fL — AB (ref 80–100)
Platelet: 176 10*3/uL (ref 150–440)
RBC: 3.52 10*6/uL — ABNORMAL LOW (ref 3.80–5.20)
RDW: 13.9 % (ref 11.5–14.5)
WBC: 8.7 10*3/uL (ref 3.6–11.0)

## 2014-03-14 LAB — COMPREHENSIVE METABOLIC PANEL
ALK PHOS: 85 U/L
ALT: 20 U/L (ref 12–78)
Albumin: 4 g/dL (ref 3.4–5.0)
Anion Gap: 11 (ref 7–16)
BUN: 18 mg/dL (ref 7–18)
Bilirubin,Total: 1.6 mg/dL — ABNORMAL HIGH (ref 0.2–1.0)
CALCIUM: 9.3 mg/dL (ref 8.5–10.1)
Chloride: 101 mmol/L (ref 98–107)
Co2: 23 mmol/L (ref 21–32)
Creatinine: 1.16 mg/dL (ref 0.60–1.30)
GFR CALC AF AMER: 51 — AB
GFR CALC NON AF AMER: 44 — AB
Glucose: 131 mg/dL — ABNORMAL HIGH (ref 65–99)
Osmolality: 274 (ref 275–301)
Potassium: 4.3 mmol/L (ref 3.5–5.1)
SGOT(AST): 58 U/L — ABNORMAL HIGH (ref 15–37)
Sodium: 135 mmol/L — ABNORMAL LOW (ref 136–145)
Total Protein: 7.3 g/dL (ref 6.4–8.2)

## 2014-03-14 LAB — LIPASE, BLOOD: Lipase: 109 U/L (ref 73–393)

## 2014-03-14 LAB — TROPONIN I

## 2014-05-18 ENCOUNTER — Ambulatory Visit: Payer: Self-pay | Admitting: Oncology

## 2014-05-18 LAB — CBC CANCER CENTER
Basophil #: 0 x10 3/mm (ref 0.0–0.1)
Basophil %: 0.1 %
EOS PCT: 0 %
Eosinophil #: 0 x10 3/mm (ref 0.0–0.7)
HCT: 39.9 % (ref 35.0–47.0)
HGB: 14 g/dL (ref 12.0–16.0)
Lymphocyte #: 1.3 x10 3/mm (ref 1.0–3.6)
Lymphocyte %: 17.8 %
MCH: 35.1 pg — ABNORMAL HIGH (ref 26.0–34.0)
MCHC: 35 g/dL (ref 32.0–36.0)
MCV: 100 fL (ref 80–100)
MONOS PCT: 11.2 %
Monocyte #: 0.8 x10 3/mm (ref 0.2–0.9)
Neutrophil #: 5 x10 3/mm (ref 1.4–6.5)
Neutrophil %: 70.9 %
Platelet: 226 x10 3/mm (ref 150–440)
RBC: 3.98 10*6/uL (ref 3.80–5.20)
RDW: 13.8 % (ref 11.5–14.5)
WBC: 7 x10 3/mm (ref 3.6–11.0)

## 2014-05-18 LAB — COMPREHENSIVE METABOLIC PANEL
ANION GAP: 10 (ref 7–16)
Albumin: 4.3 g/dL (ref 3.4–5.0)
Alkaline Phosphatase: 71 U/L
BUN: 22 mg/dL — ABNORMAL HIGH (ref 7–18)
Bilirubin,Total: 1.7 mg/dL — ABNORMAL HIGH (ref 0.2–1.0)
Calcium, Total: 9.3 mg/dL (ref 8.5–10.1)
Chloride: 102 mmol/L (ref 98–107)
Co2: 25 mmol/L (ref 21–32)
Creatinine: 1.28 mg/dL (ref 0.60–1.30)
EGFR (African American): 45 — ABNORMAL LOW
GFR CALC NON AF AMER: 39 — AB
Glucose: 102 mg/dL — ABNORMAL HIGH (ref 65–99)
OSMOLALITY: 277 (ref 275–301)
Potassium: 5 mmol/L (ref 3.5–5.1)
SGOT(AST): 16 U/L (ref 15–37)
SGPT (ALT): 13 U/L — ABNORMAL LOW
Sodium: 137 mmol/L (ref 136–145)
TOTAL PROTEIN: 7.4 g/dL (ref 6.4–8.2)

## 2014-06-02 ENCOUNTER — Ambulatory Visit: Payer: Self-pay | Admitting: Oncology

## 2014-08-14 IMAGING — CR DG ABDOMEN 3V
1 series · 4 of 4 positions shown · non-contrast
Comparison: CT abdomen and pelvis 11/15/2013.

CLINICAL DATA: Abdominal pain.  Vomiting.  Fever.

EXAM:
ABDOMEN SERIES

[Series 1: x chest ap · 0.14mm/px · 4 of 4 slices shown]
[im 1/4]
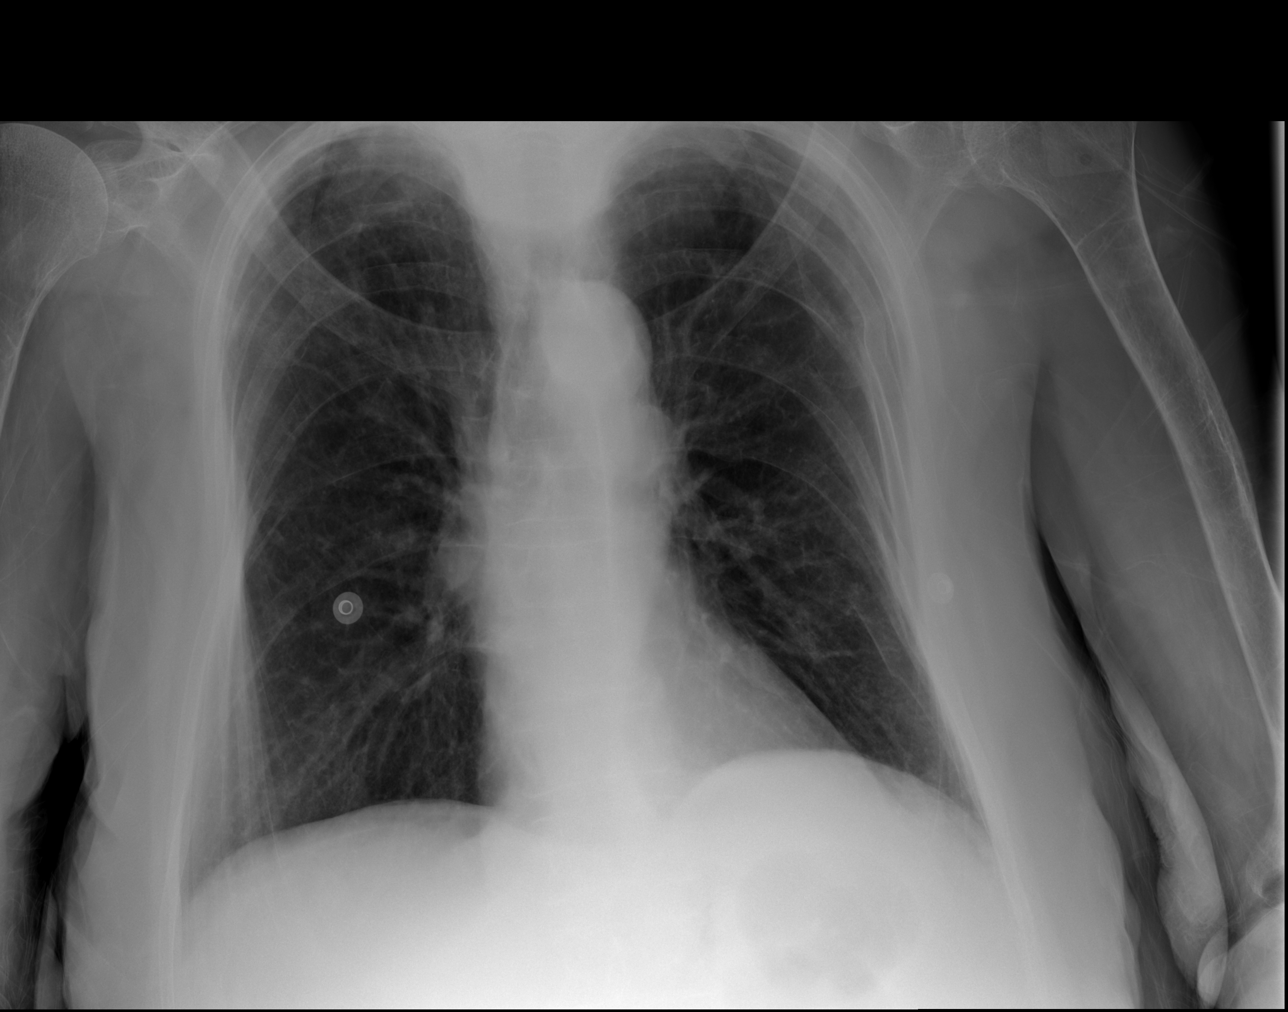
[im 2/4]
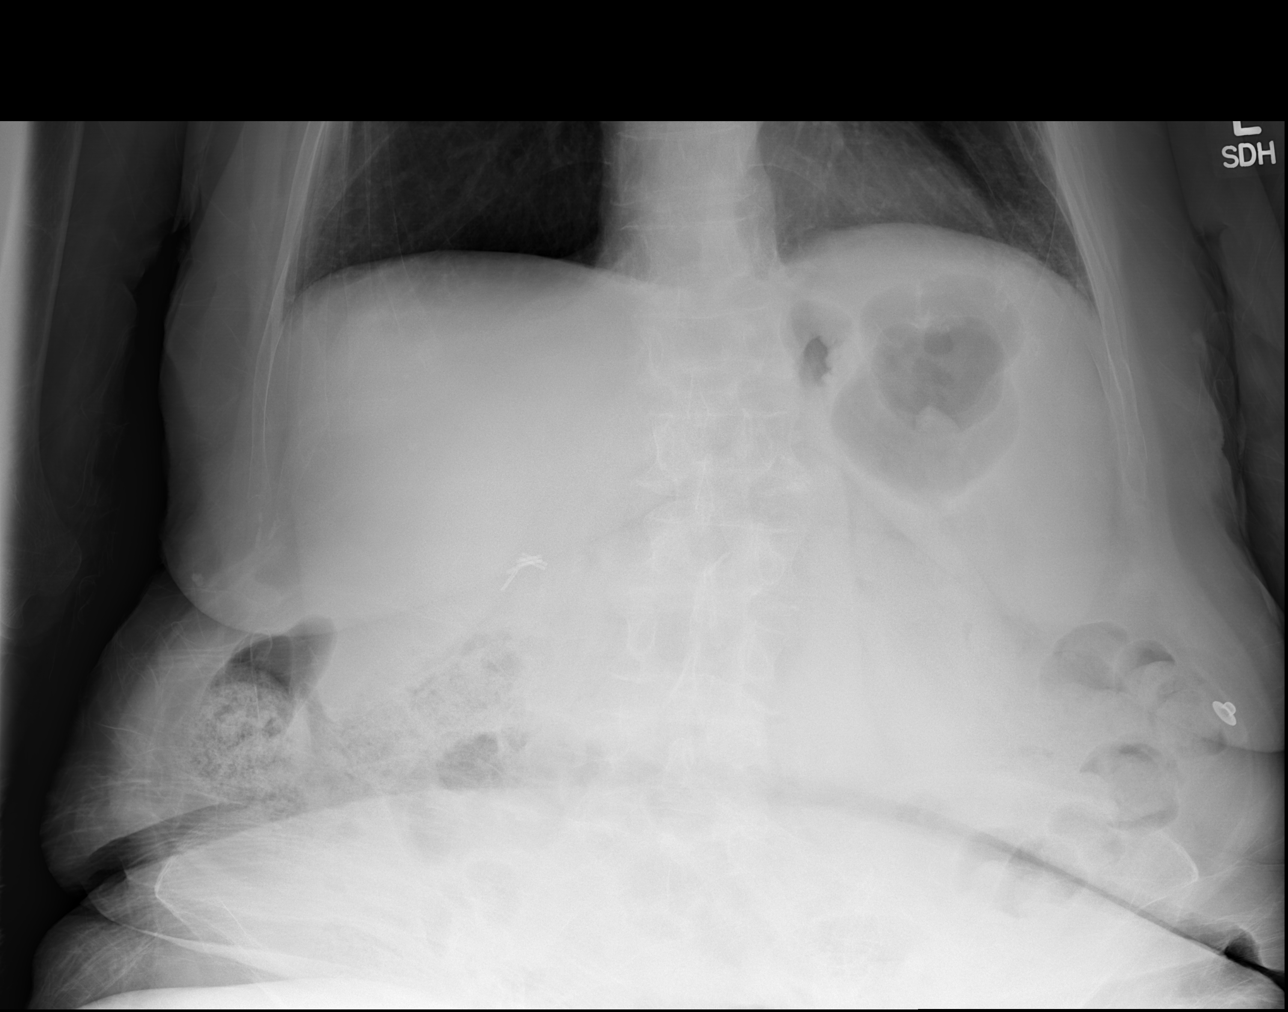
[im 3/4]
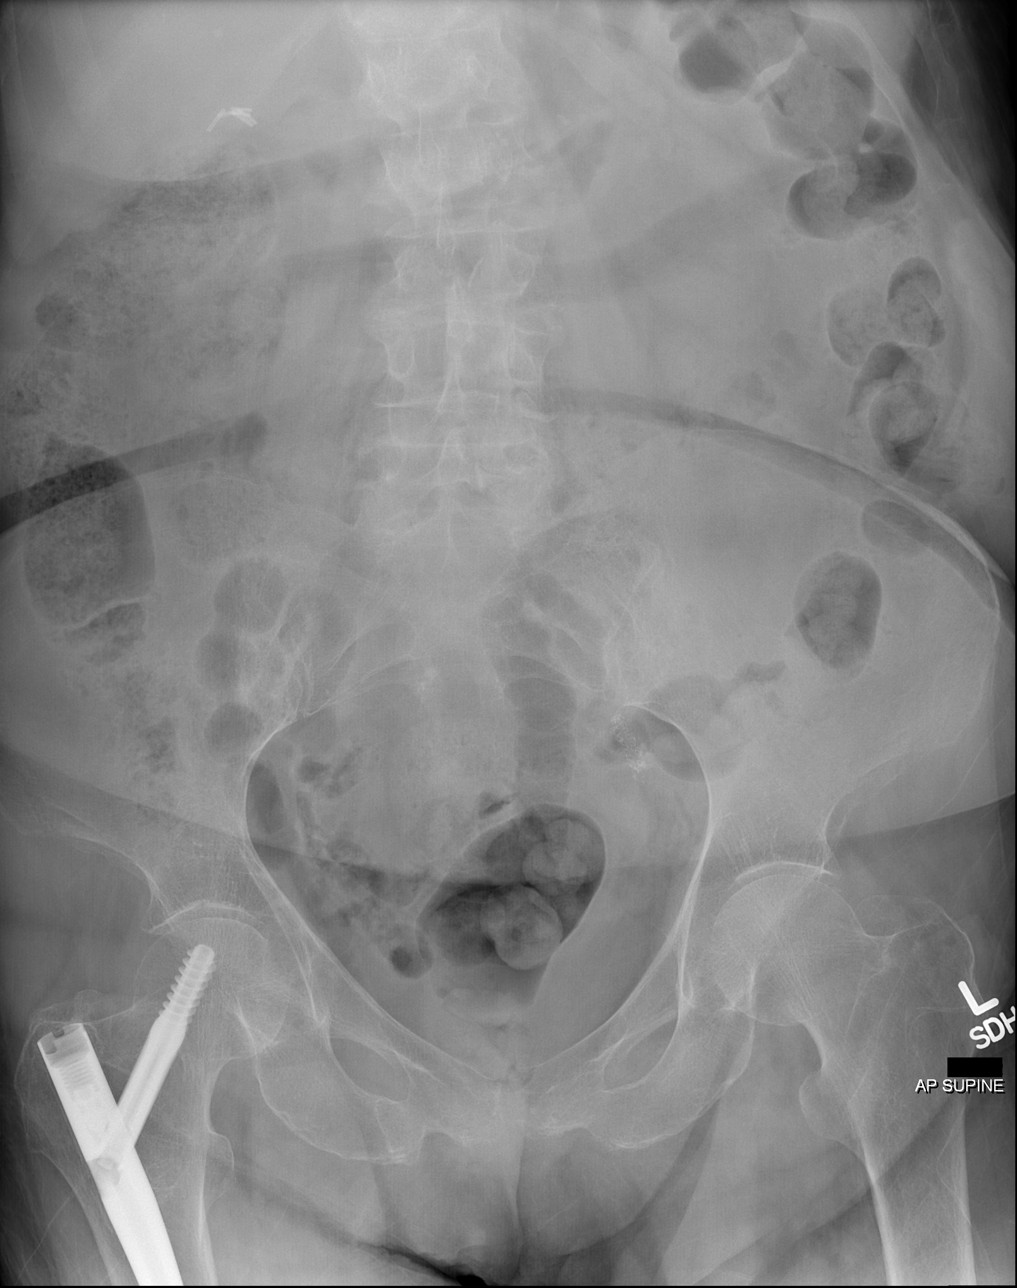
[im 4/4]
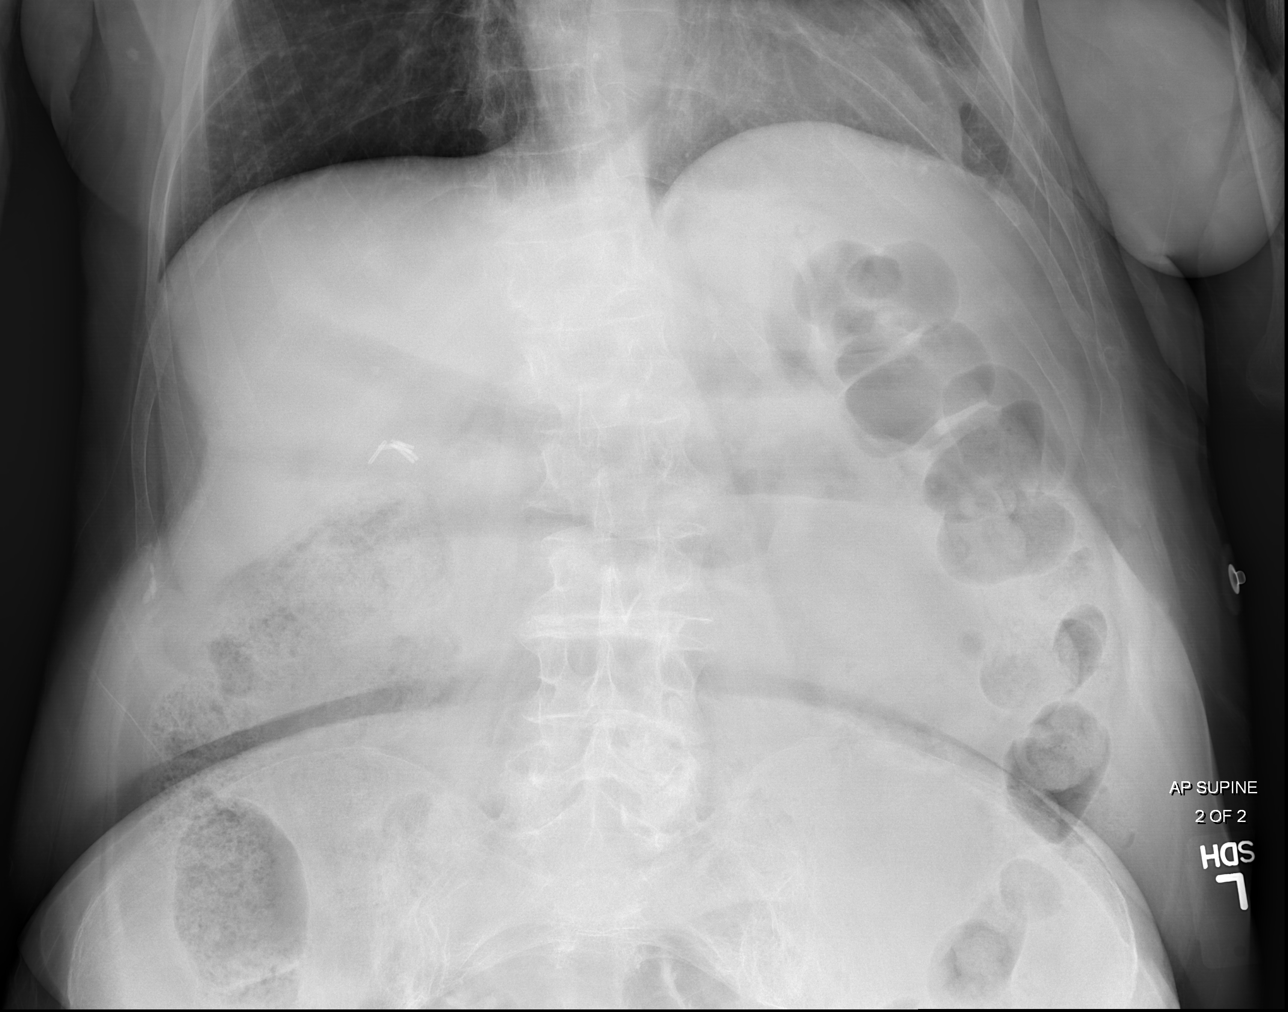

[4 of 4 positions shown; findings below may reference images not displayed]

FINDINGS: Emphysematous changes are again noted. The heart size is normal.
Remote left upper rib fractures are present. Scarring at the apices
is stable.

Supine and upright views of the abdomen demonstrate a nonspecific
bowel gas pattern. Gas and stool are present throughout the colon.
Surgical clips are present at the gallbladder fossa. The patient is
status post ORIF of the right hip.

Mild leftward curvature is present in the mid lumbar spine.
IMPRESSION: 1. Emphysema.
2. Nonspecific bowel gas pattern without evidence for obstruction or
free air.
3. Postsurgical changes of the gallbladder and right hip.

## 2014-08-17 ENCOUNTER — Ambulatory Visit: Payer: Self-pay

## 2014-08-25 ENCOUNTER — Emergency Department: Payer: Self-pay | Admitting: Emergency Medicine

## 2014-08-25 LAB — BASIC METABOLIC PANEL
ANION GAP: 7 (ref 7–16)
BUN: 23 mg/dL — ABNORMAL HIGH (ref 7–18)
CREATININE: 1.17 mg/dL (ref 0.60–1.30)
Calcium, Total: 8.8 mg/dL (ref 8.5–10.1)
Chloride: 108 mmol/L — ABNORMAL HIGH (ref 98–107)
Co2: 24 mmol/L (ref 21–32)
GFR CALC AF AMER: 57 — AB
GFR CALC NON AF AMER: 47 — AB
Glucose: 165 mg/dL — ABNORMAL HIGH (ref 65–99)
Osmolality: 285 (ref 275–301)
POTASSIUM: 4.1 mmol/L (ref 3.5–5.1)
SODIUM: 139 mmol/L (ref 136–145)

## 2014-08-25 LAB — CBC WITH DIFFERENTIAL/PLATELET
BASOS PCT: 0 %
Basophil #: 0 10*3/uL (ref 0.0–0.1)
EOS ABS: 0 10*3/uL (ref 0.0–0.7)
EOS PCT: 0 %
HCT: 37.5 % (ref 35.0–47.0)
HGB: 13 g/dL (ref 12.0–16.0)
LYMPHS PCT: 17.8 %
Lymphocyte #: 1.3 10*3/uL (ref 1.0–3.6)
MCH: 35.3 pg — ABNORMAL HIGH (ref 26.0–34.0)
MCHC: 34.7 g/dL (ref 32.0–36.0)
MCV: 102 fL — AB (ref 80–100)
Monocyte #: 0.6 x10 3/mm (ref 0.2–0.9)
Monocyte %: 8.7 %
Neutrophil #: 5.5 10*3/uL (ref 1.4–6.5)
Neutrophil %: 73.5 %
Platelet: 219 10*3/uL (ref 150–440)
RBC: 3.68 10*6/uL — AB (ref 3.80–5.20)
RDW: 14 % (ref 11.5–14.5)
WBC: 7.4 10*3/uL (ref 3.6–11.0)

## 2015-01-23 ENCOUNTER — Emergency Department
Admit: 2015-01-23 | Disposition: A | Discharge status: Home / Self Care | Payer: Self-pay | Admitting: Emergency Medicine

## 2015-01-23 LAB — COMPREHENSIVE METABOLIC PANEL
ALBUMIN: 4.5 g/dL
ANION GAP: 7 (ref 7–16)
AST: 29 U/L
Alkaline Phosphatase: 61 U/L
BUN: 30 mg/dL — AB
Bilirubin,Total: 1.8 mg/dL — ABNORMAL HIGH
CALCIUM: 9.2 mg/dL
CHLORIDE: 103 mmol/L
Co2: 25 mmol/L
Creatinine: 1.07 mg/dL — ABNORMAL HIGH
EGFR (African American): 56 — ABNORMAL LOW
EGFR (Non-African Amer.): 48 — ABNORMAL LOW
Glucose: 97 mg/dL
POTASSIUM: 4.9 mmol/L
SGPT (ALT): 14 U/L
SODIUM: 135 mmol/L
Total Protein: 7 g/dL

## 2015-01-23 LAB — CBC
HCT: 34.7 % — ABNORMAL LOW (ref 35.0–47.0)
HGB: 12.5 g/dL (ref 12.0–16.0)
MCH: 36 pg — AB (ref 26.0–34.0)
MCHC: 35.9 g/dL (ref 32.0–36.0)
MCV: 100 fL (ref 80–100)
PLATELETS: 211 10*3/uL (ref 150–440)
RBC: 3.47 10*6/uL — AB (ref 3.80–5.20)
RDW: 13.5 % (ref 11.5–14.5)
WBC: 8 10*3/uL (ref 3.6–11.0)

## 2015-01-23 LAB — TROPONIN I: Troponin-I: <0.03 ng/mL

## 2015-01-24 NOTE — Discharge Summary (Signed)
PATIENT NAME:  Vanessa [name], Vanessa [name] MR#:  [id number] DATE OF BIRTH:  1931-05-09  DATE OF ADMISSION:  03/11/2012 DATE OF DISCHARGE:  03/12/2012  PRIMARY CARE PHYSICIAN: Nicky Pugh, MD    DISCHARGE DIAGNOSES:  1. Transient ischemic attack and weakness, fall.  2. Diabetes.  3. Hyperlipidemia.   CONDITION: Stable.   HOME MEDICATIONS:   1. Tylenol Arthritis caplet 650 mg p.o. once daily.  2. Levothyroxine 100 mcg p.o. daily.  3. Citalopram 20 mg p.o. once daily.  4. Sleep Tabs, 1 tablet p.o. daily.  5. Fioricet 1 to 2 tablets p.o. every 6 hours.  6. Metformin 500 mg p.o. b.i.d.   Additional medications:  1. Aspirin 81 mg p.o. daily. 2. Lipitor 20 mg p.o. daily.  DIET: ADA diet.   ACTIVITY: As tolerated.  FOLLOW-UP CARE:  1. Follow up with primary care physician within 1 to 2 weeks.  2. The patient also needs outpatient physical therapy for weakness.   REASON FOR ADMISSION: Aphasia and weakness.   HOSPITAL COURSE: The patient is a 81 year old Caucasian female with a history for previous cerebrovascular accident, diabetes, hypothyroidism, history of colon cancer, depression, failure to thrive with recurrent falls, presented to the Emergency Department  with aphasia and weakness. The patient was unable to get out of bed in the morning and had a difficult time moving. She also noticed her speech was a little bit slurred. She was whispering and unable to talk well, so she called 911 and came to the ED for further evaluation. On the admission date, the patient's CAT scan of the head showed no evidence of acute intracranial abnormality. Urinalysis was negative. The patient was admitted for transient ischemic attack, need to rule out cerebrovascular accident. After admission she was started with aspirin, and the lipid panel showed LDL was 118, so the patient was treated with Lipitor 20 mg p.o. daily. MRI of the brain was done today which showed no acute cerebrovascular accident. In addition, the  patient's 2D echocardiogram is normal with the ejection fraction more than 55%.   For diabetes, the patient has been treated with sliding scale and metformin. A hemoglobin is 3.9. For weakness, the patient has been treated with physical therapy. We recommend outpatient physical therapy. The patient has no symptoms. She is clinically stable. She will be discharged home today.   I discussed the patient's discharge plan with the patient and the case manager and nurse.   TIME SPENT: About 45 minutes.   ____________________________ Demetrios Loll, MD qc:cbb D: 03/12/2012 17:09:43 ET T: 03/13/2012 10:18:41 ET JOB#: [id number]  cc: Demetrios Loll, MD, <Dictator> Mikeal Hawthorne. Brynda Greathouse, MD Demetrios Loll MD ELECTRONICALLY SIGNED 03/13/2012 18:24

## 2015-01-24 NOTE — H&P (Signed)
PATIENT NAME:  Vanessa [name], Vanessa [name] MR#:  [id number] DATE OF BIRTH:  [birth year]  DATE OF ADMISSION:  03/11/2012  PRIMARY CARE PHYSICIAN: Dr. Brynda Greathouse    CHIEF COMPLAINT:  Aphasia and weakness.   HISTORY OF PRESENT ILLNESS: This is a 81 year old female who comes in from home due to acute onset of aphasia and also weakness. The patient said she had a stroke last year sometimes with similar symptoms. She said she woke up this morning, was unable to get out of bed and was having a difficult time moving. She also noticed that her speech was a little bit slurry and she was whispering and she couldn't talk well. She lives with her husband but he was in the other room and she could not get his attention. The patient, therefore, was a bit concerned and, therefore, called 9-1-1 and was brought to the hospital. By the time the ambulance brought the patient to the hospital, her symptoms had resolved. Presently, she feels neurologically well with no headache, no dizziness, no generalized weakness, no numbness, no tingling, no aphasia, no dysphagia, and no other associated symptoms presently. Hospitalist services were then contacted for further treatment and evaluation.   REVIEW OF SYSTEMS: CONSTITUTIONAL: No documented fever. No weight gain, no weight loss. EYES: No blurry or double vision. ENT: No tinnitus or postnasal drip. No redness of the oropharynx. RESPIRATORY: No cough, no wheeze, no hemoptysis, no dyspnea. CARDIOVASCULAR: No chest pain, no orthopnea, no palpitations, no syncope. GI: No nausea, no vomiting, no diarrhea, no abdominal pain, no melena, no hematochezia. GU: No dysuria, no hematuria. ENDOCRINE: No polyuria or nocturia. No heat or cold intolerance. HEME: No anemia, no bruising, no bleeding. INTEGUMENTARY: No rashes, no lesions. MUSCULOSKELETAL: No arthritis, no swelling, no gout. NEUROLOGIC: No numbness, no tingling, no ataxia, no seizure-type activity. Positive aphasia, now resolved. PSYCH: No anxiety, no  insomnia, no ADD. Positive depression.   ALLERGIES: Codeine causes nausea and vomiting.   SOCIAL HISTORY: No smoking. No alcohol abuse. No illicit drug abuse. Lives at home with her husband.   FAMILY HISTORY: Father died from a heart attack. Mother died from lung cancer.   PAST MEDICAL HISTORY:  1. History of previous cerebrovascular accident. 2. Diabetes. 3. Hypothyroidism. 4. History of colon cancer. 5. Depression. 6. Failure to thrive with recurrent falls.   PHYSICAL EXAMINATION ON ADMISSION:   VITAL SIGNS: Temperature 97.9, pulse 85, respirations 16, blood pressure 125/84, sats 97% on room air.   GENERAL: She is a pleasant appearing female in no apparent distress.   HEENT: Atraumatic, normocephalic. Extraocular muscles are intact. Pupils equal and reactive to light. Sclerae anicteric. No conjunctival injection. No pharyngeal erythema.   NECK: Supple. No jugular venous distention. No bruits. No lymphadenopathy. No thyromegaly.   HEART: Regular rate and rhythm. No murmurs, no rubs, no clicks.   LUNGS: Clear to auscultation bilaterally. No rales, no rhonchi, no wheezes.   ABDOMEN: Soft, flat, nontender, nondistended. Has good bowel sounds. No hepatosplenomegaly appreciated.   EXTREMITIES: There is no evidence of any cyanosis, clubbing, or peripheral edema. Has +2 pedal and radial pulses bilaterally.   NEUROLOGIC: The patient is alert, awake, and oriented x3 with no focal motor or sensory deficits appreciated bilaterally. The patient does have left-sided weakness upper extremity and lower extremity about 3/5 strength from previous stroke but no acute focal neurologic deficits appreciated.   SKIN: Moist and warm with no rashes.   LYMPHATIC: There is no cervical or axillary lymphadenopathy.   LABORATORY, DIAGNOSTIC, AND  RADIOLOGICAL DATA: Glucose 107, BUN 19, creatinine 0.8, sodium 138, potassium 4.6, chloride 106, bicarb 24. Troponin less than 0.02. White cell count 5.2,  hemoglobin 12.8, hematocrit 36.0, platelet count 141. Urinalysis is within normal limits.   The patient did have a CT scan of the head done which showed no evidence of any acute intracranial abnormality.   ASSESSMENT, AND PLAN: This is a 81 year old female with multiple medical problems as mentioned above with history of diabetes, hyperlipidemia, history of previous stroke, history of depression, and history of colon cancer who presented to the hospital with aphasia and generalized weakness.  1. CVA/TIA. This is likely suspicious diagnosis given the fact that the patient has a previous history of CVA and has had similar symptoms like this. Although her symptoms have now resolved and she is back to baseline, therefore, this is likely a TIA. The patient does not take any aspirin. I will start her on some aspirin for now. Go ahead and check a lipid profile. She used to be on a statin, is currently not taking it. Will follow neuro checks q.4 hours. The patient's initial CT of the head is negative. Will go ahead and get an MRI of her brain without contrast. She had a recent carotid duplex in May of 2012 which was normal which I will not repeat at this time. Will get a 2-D echo. Will also get a physical therapy consult as she has had some recurrent falls recently.  2. Diabetes. Will check a hemoglobin A1c. Continue metformin and sliding scale insulin. Follow sugars and a carb-controlled diet.  3. Hypothyroidism. Continue Synthroid.  4. Depression. Continue Celexa.  5. Recurrent falls and ambulation dysfunction. I will get a physical therapy consult.   CODE STATUS: The patient is a FULL CODE.   TIME SPENT: 50 minutes.   ____________________________ Belia Heman. Verdell Carmine, MD vjs:drc D: 03/11/2012 13:08:58 ET T: 03/11/2012 14:01:52 ET JOB#: [id number]  cc: Belia Heman. Verdell Carmine, MD, <Dictator> Mikeal Hawthorne. Brynda Greathouse, MD Henreitta Leber MD ELECTRONICALLY SIGNED 03/14/2012 8:11

## 2015-01-29 ENCOUNTER — Ambulatory Visit
Admit: 2015-01-29 | Disposition: A | Discharge status: Home / Self Care | Payer: Self-pay | Attending: Internal Medicine | Admitting: Internal Medicine

## 2015-03-25 ENCOUNTER — Encounter: Payer: Self-pay | Admitting: Intensive Care

## 2015-03-25 ENCOUNTER — Emergency Department: Payer: Medicare Other

## 2015-03-25 ENCOUNTER — Other Ambulatory Visit: Payer: Self-pay

## 2015-03-25 ENCOUNTER — Emergency Department
Admission: EM | Admit: 2015-03-25 | Discharge: 2015-03-25 | Disposition: A | Discharge status: Home / Self Care | Payer: Medicare Other | Attending: Emergency Medicine | Admitting: Emergency Medicine

## 2015-03-25 DIAGNOSIS — E039 Hypothyroidism, unspecified: Secondary | ICD-10-CM | POA: Insufficient documentation

## 2015-03-25 DIAGNOSIS — Z880 Allergy status to penicillin: Secondary | ICD-10-CM | POA: Diagnosis not present

## 2015-03-25 DIAGNOSIS — F419 Anxiety disorder, unspecified: Secondary | ICD-10-CM | POA: Diagnosis not present

## 2015-03-25 DIAGNOSIS — R4182 Altered mental status, unspecified: Secondary | ICD-10-CM | POA: Diagnosis not present

## 2015-03-25 HISTORY — DX: Disorder of thyroid, unspecified: E07.9

## 2015-03-25 HISTORY — DX: Malignant neoplasm of colon, unspecified: C18.9

## 2015-03-25 LAB — COMPREHENSIVE METABOLIC PANEL
ALK PHOS: 58 U/L (ref 38–126)
ALT: 12 U/L — AB (ref 14–54)
ANION GAP: 7 (ref 5–15)
AST: 27 U/L (ref 15–41)
Albumin: 4.1 g/dL (ref 3.5–5.0)
BILIRUBIN TOTAL: 1.8 mg/dL — AB (ref 0.3–1.2)
BUN: 27 mg/dL — AB (ref 6–20)
CHLORIDE: 110 mmol/L (ref 101–111)
CO2: 24 mmol/L (ref 22–32)
Calcium: 9 mg/dL (ref 8.9–10.3)
Creatinine, Ser: 1.14 mg/dL — ABNORMAL HIGH (ref 0.44–1.00)
GFR, EST AFRICAN AMERICAN: 50 mL/min — AB (ref >60)
GFR, EST NON AFRICAN AMERICAN: 44 mL/min — AB (ref >60)
GLUCOSE: 118 mg/dL — AB (ref 65–99)
Potassium: 4.3 mmol/L (ref 3.5–5.1)
SODIUM: 141 mmol/L (ref 135–145)
Total Protein: 6.7 g/dL (ref 6.5–8.1)

## 2015-03-25 LAB — URINALYSIS COMPLETE WITH MICROSCOPIC (ARMC ONLY)
Bilirubin Urine: NEGATIVE
GLUCOSE, UA: NEGATIVE mg/dL
Ketones, ur: NEGATIVE mg/dL
Nitrite: NEGATIVE
PH: 5 (ref 5.0–8.0)
PROTEIN: NEGATIVE mg/dL
Specific Gravity, Urine: 1.024 (ref 1.005–1.030)

## 2015-03-25 LAB — CBC WITH DIFFERENTIAL/PLATELET
Basophils Absolute: 0 10*3/uL (ref 0–0.1)
Basophils Relative: 0 %
EOS PCT: 0 %
Eosinophils Absolute: 0 10*3/uL (ref 0–0.7)
HCT: 33.2 % — ABNORMAL LOW (ref 35.0–47.0)
HEMOGLOBIN: 11.9 g/dL — AB (ref 12.0–16.0)
LYMPHS ABS: 1.3 10*3/uL (ref 1.0–3.6)
Lymphocytes Relative: 20 %
MCH: 37.4 pg — ABNORMAL HIGH (ref 26.0–34.0)
MCHC: 35.8 g/dL (ref 32.0–36.0)
MCV: 104.4 fL — AB (ref 80.0–100.0)
MONO ABS: 0.5 10*3/uL (ref 0.2–0.9)
MONOS PCT: 8 %
NEUTROS ABS: 4.7 10*3/uL (ref 1.4–6.5)
Neutrophils Relative %: 72 %
Platelets: 189 10*3/uL (ref 150–440)
RBC: 3.18 MIL/uL — ABNORMAL LOW (ref 3.80–5.20)
RDW: 14.8 % — ABNORMAL HIGH (ref 11.5–14.5)
WBC: 6.5 10*3/uL (ref 3.6–11.0)

## 2015-03-25 LAB — T4, FREE: FREE T4: 0.47 ng/dL — AB (ref 0.61–1.12)

## 2015-03-25 LAB — TROPONIN I: Troponin I: <0.03 ng/mL (ref <0.031)

## 2015-03-25 LAB — TSH: TSH: 47.865 u[IU]/mL — ABNORMAL HIGH (ref 0.350–4.500)

## 2015-03-25 MED ORDER — LORAZEPAM 0.5 MG PO TABS: 0.5000 mg | ORAL_TABLET | Freq: Two times a day (BID) | ORAL | Status: AC

## 2015-03-25 MED ORDER — LEVOTHYROXINE SODIUM 25 MCG PO TABS: 25.0000 ug | ORAL_TABLET | Freq: Every day | ORAL | Status: DC

## 2015-03-25 MED ORDER — LEVOTHYROXINE SODIUM 50 MCG PO TABS: 50.0000 ug | ORAL_TABLET | Freq: Every day | ORAL | Status: DC

## 2015-03-25 NOTE — Discharge Instructions (Signed)
Panic Attacks Panic attacks are sudden, short-livedsurges of severe anxiety, fear, or discomfort. They may occur for no reason when you are relaxed, when you are anxious, or when you are sleeping. Panic attacks may occur for a number of reasons:   Healthy people occasionally have panic attacks in extreme, life-threatening situations, such as war or natural disasters. Normal anxiety is a protective mechanism of the body that helps [place] react to danger (fight or flight response).  Panic attacks are often seen with anxiety disorders, such as panic disorder, social anxiety disorder, generalized anxiety disorder, and phobias. Anxiety disorders cause excessive or uncontrollable anxiety. They may interfere with your relationships or other life activities.  Panic attacks are sometimes seen with other mental illnesses, such as depression and posttraumatic stress disorder.  Certain medical conditions, prescription medicines, and drugs of abuse can cause panic attacks. SYMPTOMS  Panic attacks start suddenly, peak within 20 minutes, and are accompanied by four or more of the following symptoms:  Pounding heart or fast heart rate (palpitations).  Sweating.  Trembling or shaking.  Shortness of breath or feeling smothered.  Feeling choked.  Chest pain or discomfort.  Nausea or strange feeling in your stomach.  Dizziness, light-headedness, or feeling like you will faint.  Chills or hot flushes.  Numbness or tingling in your lips or hands and feet.  Feeling that things are not real or feeling that you are not yourself.  Fear of losing control or going crazy.  Fear of dying. Some of these symptoms can mimic serious medical conditions. For example, you may think you are having a heart attack. Although panic attacks can be very scary, they are not life threatening. DIAGNOSIS  Panic attacks are diagnosed through an assessment by your health care provider. Your health care provider will ask  questions about your symptoms, such as where and when they occurred. Your health care provider will also ask about your medical history and use of alcohol and drugs, including prescription medicines. Your health care provider may order blood tests or other studies to rule out a serious medical condition. Your health care provider may refer you to a mental health professional for further evaluation. TREATMENT   Most healthy people who have one or two panic attacks in an extreme, life-threatening situation will not require treatment.  The treatment for panic attacks associated with anxiety disorders or other mental illness typically involves counseling with a mental health professional, medicine, or a combination of both. Your health care provider will help determine what treatment is best for you.  Panic attacks due to physical illness usually go away with treatment of the illness. If prescription medicine is causing panic attacks, talk with your health care provider about stopping the medicine, decreasing the dose, or substituting another medicine.  Panic attacks due to alcohol or drug abuse go away with abstinence. Some adults need professional help in order to stop drinking or using drugs. HOME CARE INSTRUCTIONS   Take all medicines as directed by your health care provider.   Schedule and attend follow-up visits as directed by your health care provider. It is important to keep all your appointments. SEEK MEDICAL CARE IF:  You are not able to take your medicines as prescribed.  Your symptoms do not improve or get worse. SEEK IMMEDIATE MEDICAL CARE IF:   You experience panic attack symptoms that are different than your usual symptoms.  You have serious thoughts about hurting yourself or others.  You are taking medicine for panic attacks and  have a serious side effect. MAKE SURE YOU:  Understand these instructions.  Will watch your condition.  Will get help right away if you are not  doing well or get worse. Document Released: 09/18/2005 Document Revised: 09/23/2013 Document Reviewed: 05/02/2013 [facility] Patient Information 2015 [place], [place]. This information is not intended to replace advice given to you by your health care provider. Make sure you discuss any questions you have with your health care provider.  Hypothyroidism The thyroid is a large gland located in the lower front of your neck. The thyroid gland helps control metabolism. Metabolism is how your body handles food. It controls metabolism with the hormone thyroxine. When this gland is underactive (hypothyroid), it produces too little hormone.  CAUSES These include:   Absence or destruction of thyroid tissue.  Goiter due to iodine deficiency.  Goiter due to medications.  Congenital defects (since birth).  Problems with the pituitary. This causes a lack of TSH (thyroid stimulating hormone). This hormone tells the thyroid to turn out more hormone. SYMPTOMS  Lethargy (feeling as though you have no energy)  Cold intolerance  Weight gain (in spite of normal food intake)  Dry skin  Coarse hair  Menstrual irregularity (if severe, may lead to infertility)  Slowing of thought processes Cardiac problems are also caused by insufficient amounts of thyroid hormone. Hypothyroidism in the newborn is cretinism, and is an extreme form. It is important that this form be treated adequately and immediately or it will lead rapidly to retarded physical and mental development. DIAGNOSIS  To prove hypothyroidism, your caregiver may do blood tests and ultrasound tests. Sometimes the signs are hidden. It may be necessary for your caregiver to watch this illness with blood tests either before or after diagnosis and treatment. TREATMENT  Low levels of thyroid hormone are increased by using synthetic thyroid hormone. This is a safe, effective treatment. It usually takes about four weeks to gain the full effects of the  medication. After you have the full effect of the medication, it will generally take another four weeks for problems to leave. Your caregiver may start you on low doses. If you have had heart problems the dose may be gradually increased. It is generally not an emergency to get rapidly to normal. HOME CARE INSTRUCTIONS   Take your medications as your caregiver suggests. Let your caregiver know of any medications you are taking or start taking. Your caregiver will help you with dosage schedules.  As your condition improves, your dosage needs may increase. It will be necessary to have continuing blood tests as suggested by your caregiver.  Report all suspected medication side effects to your caregiver. SEEK MEDICAL CARE IF: Seek medical care if you develop:  Sweating.  Tremulousness (tremors).  Anxiety.  Rapid weight loss.  Heat intolerance.  Emotional swings.  Diarrhea.  Weakness. SEEK IMMEDIATE MEDICAL CARE IF:  You develop chest pain, an irregular heart beat (palpitations), or a rapid heart beat. MAKE SURE YOU:   Understand these instructions.  Will watch your condition.  Will get help right away if you are not doing well or get worse. Document Released: 09/18/2005 Document Revised: 12/11/2011 Document Reviewed: 05/08/2008 [facility] Patient Information 2015 [place], [place]. This information is not intended to replace advice given to you by your health care provider. Make sure you discuss any questions you have with your health care provider.

## 2015-03-25 NOTE — ED Notes (Signed)
Pt arrived by EMS from home. PT recently was prescribed a drop in her thyroid medicine from 100 to 25. Name of medicine unknown. PT states "I feel like I am going to have a nervous breakdown"

## 2015-03-25 NOTE — ED Provider Notes (Signed)
[facility] Emergency Department Provider Note     Time seen: ----------------------------------------- 12:07 PM on 03/25/2015 -----------------------------------------    I have reviewed the triage vital signs and the nursing notes.   HISTORY  Chief Complaint Medical Clearance    HPI Vanessa [name] is a 81 y.o. female sent by EMS from home for feeling like she's can have a nervous breakdown. Symptoms are recent over the last several days, nothing seems to make it better or worse. Symptoms are currently mild this time. Patient states they recently changed her thyroid medication and she's been feeling very anxious since that period time. Denies any fevers chills or other complaints.   Past Medical History  Diagnosis Date  . Thyroid disorder   . Colon cancer     There are no active problems to display for this patient.   Past Surgical History  Procedure Laterality Date  . Colon surgery    . Back surgery      Allergies Benadryl; Codeine; and Penicillins  Social History History  Substance Use Topics  . Smoking status: Never Smoker   . Smokeless tobacco: Never Used  . Alcohol Use: No    Review of Systems Constitutional: Negative for fever. Eyes: Negative for visual changes. ENT: Negative for sore throat. Cardiovascular: Negative for chest pain. Respiratory: Negative for shortness of breath. Gastrointestinal: Negative for abdominal pain, vomiting and diarrhea. Genitourinary: Negative for dysuria. Musculoskeletal: Negative for back pain. Skin: Negative for rash. Neurological: Negative for headaches, focal weakness or numbness. Psychiatric: Positive for anxiety  10-point ROS otherwise negative.  ____________________________________________   PHYSICAL EXAM:  VITAL SIGNS: ED Triage Vitals  Enc Vitals Group     BP 03/25/15 1202 103/52 mmHg     Pulse Rate 03/25/15 1202 81     Resp 03/25/15 1202 27     Temp 03/25/15 1202 97.8 F  (36.6 C)     Temp Source 03/25/15 1202 Oral     SpO2 03/25/15 1202 96 %     Weight 03/25/15 1157 142 lb (64.411 kg)     Height 03/25/15 1157 5\' 5"  (1.651 m)     Head Cir --      Peak Flow --      Pain Score --      Pain Loc --      Pain Edu? --      Excl. in [place]? --     Constitutional: Alert and oriented. Well appearing and in no distress. Eyes: Conjunctivae are normal. PERRL. Normal extraocular movements. ENT   Head: Normocephalic and atraumatic.   Nose: No congestion/rhinnorhea.   Mouth/Throat: Mucous membranes are moist.   Neck: No stridor. Hematological/Lymphatic/Immunilogical: No cervical lymphadenopathy. Cardiovascular: Normal rate, regular rhythm. Normal and symmetric distal pulses are present in all extremities. No murmurs, rubs, or gallops. Respiratory: Normal respiratory effort without tachypnea nor retractions. Breath sounds are clear and equal bilaterally. No wheezes/rales/rhonchi. Gastrointestinal: Soft and nontender. No distention. No abdominal bruits. There is no CVA tenderness. Musculoskeletal: Nontender with normal range of motion in all extremities. No joint effusions.  No lower extremity tenderness nor edema. Neurologic:  Normal speech and language. No gross focal neurologic deficits are appreciated. Speech is normal. No gait instability. Skin:  Skin is warm, dry and intact. No rash noted. Psychiatric: Mood and affect are normal. Speech and behavior are normal. Patient exhibits appropriate insight and judgment. ____________________________________________  EKG: Interpreted by me. Normal sinus rhythm, left axis deviation, incomplete right bundle branch block. Nonspecific T-wave inversions.  No evidence of acute infarction. Rate is 81  ____________________________________________  ED COURSE:  Pertinent labs & imaging results that were available during my care of the patient were reviewed by me and considered in my medical decision making (see chart for  details). We'll check a thyroid panel as well as typical basic labs, and reevaluate. ____________________________________________    LABS (pertinent positives/negatives)  Labs Reviewed  CBC WITH DIFFERENTIAL/PLATELET - Abnormal; Notable for the following:    RBC 3.18 (*)    Hemoglobin 11.9 (*)    HCT 33.2 (*)    MCV 104.4 (*)    MCH 37.4 (*)    RDW 14.8 (*)    All other components within normal limits  COMPREHENSIVE METABOLIC PANEL - Abnormal; Notable for the following:    Glucose, Bld 118 (*)    BUN 27 (*)    Creatinine, Ser 1.14 (*)    ALT 12 (*)    Total Bilirubin 1.8 (*)    GFR calc non Af Amer 44 (*)    GFR calc Af Amer 50 (*)    All other components within normal limits  URINALYSIS COMPLETEWITH MICROSCOPIC ([facility] ONLY) - Abnormal; Notable for the following:    Color, Urine AMBER (*)    APPearance CLOUDY (*)    Hgb urine dipstick 1+ (*)    Leukocytes, UA TRACE (*)    Bacteria, UA MANY (*)    Squamous Epithelial / LPF 6-30 (*)    All other components within normal limits  TSH - Abnormal; Notable for the following:    TSH 47.865 (*)    All other components within normal limits  T4, FREE - Abnormal; Notable for the following:    Free T4 0.47 (*)    All other components within normal limits  TROPONIN I    RADIOLOGY CT head  IMPRESSION: Mild diffuse atrophy, stable. Small posterior inferior left parafalcine meningioma without mass effect or edema. Right basal ganglia infarct, chronic and stable. Slight periventricular small vessel disease. No hemorrhage or acute appearing infarct.  ____________________________________________  FINAL ASSESSMENT AND PLAN  Anxiety and hypothyroidism  Plan: Patient with labs and imaging as above. Husband thinks she was having a "nervous breakdown". Patient does appear to have some anxiety related symptoms. Will discharge with low-dose Ativan to take as needed and she will need a slight increase in her Synthroid as well. She is  stable for outpatient follow-up with her doctor   Earleen Newport, MD   Earleen Newport, MD 03/25/15 [phone]

## 2015-05-06 ENCOUNTER — Emergency Department
Admission: EM | Admit: 2015-05-06 | Discharge: 2015-05-06 | Disposition: A | Discharge status: Home / Self Care | Payer: Medicare Other | Attending: Emergency Medicine | Admitting: Emergency Medicine

## 2015-05-06 ENCOUNTER — Emergency Department: Payer: Medicare Other

## 2015-05-06 DIAGNOSIS — Z79899 Other long term (current) drug therapy: Secondary | ICD-10-CM | POA: Diagnosis not present

## 2015-05-06 DIAGNOSIS — Z880 Allergy status to penicillin: Secondary | ICD-10-CM | POA: Diagnosis not present

## 2015-05-06 DIAGNOSIS — I69952 Hemiplegia and hemiparesis following unspecified cerebrovascular disease affecting left dominant side: Secondary | ICD-10-CM | POA: Diagnosis not present

## 2015-05-06 DIAGNOSIS — I10 Essential (primary) hypertension: Secondary | ICD-10-CM | POA: Insufficient documentation

## 2015-05-06 DIAGNOSIS — E039 Hypothyroidism, unspecified: Secondary | ICD-10-CM | POA: Diagnosis not present

## 2015-05-06 DIAGNOSIS — R531 Weakness: Secondary | ICD-10-CM

## 2015-05-06 DIAGNOSIS — R5383 Other fatigue: Secondary | ICD-10-CM | POA: Diagnosis present

## 2015-05-06 LAB — URINALYSIS COMPLETE WITH MICROSCOPIC (ARMC ONLY)
Bilirubin Urine: NEGATIVE
GLUCOSE, UA: NEGATIVE mg/dL
Hgb urine dipstick: NEGATIVE
KETONES UR: NEGATIVE mg/dL
NITRITE: NEGATIVE
PROTEIN: NEGATIVE mg/dL
RBC / HPF: NONE SEEN RBC/hpf (ref 0–5)
Specific Gravity, Urine: 1.014 (ref 1.005–1.030)
pH: 6 (ref 5.0–8.0)

## 2015-05-06 LAB — CBC
HCT: 33 % — ABNORMAL LOW (ref 35.0–47.0)
Hemoglobin: 11.9 g/dL — ABNORMAL LOW (ref 12.0–16.0)
MCH: 37.3 pg — ABNORMAL HIGH (ref 26.0–34.0)
MCHC: 36.2 g/dL — AB (ref 32.0–36.0)
MCV: 103 fL — ABNORMAL HIGH (ref 80.0–100.0)
Platelets: 217 10*3/uL (ref 150–440)
RBC: 3.2 MIL/uL — AB (ref 3.80–5.20)
RDW: 13.5 % (ref 11.5–14.5)
WBC: 7.9 10*3/uL (ref 3.6–11.0)

## 2015-05-06 LAB — BASIC METABOLIC PANEL
Anion gap: 9 (ref 5–15)
BUN: 26 mg/dL — AB (ref 6–20)
CALCIUM: 9.1 mg/dL (ref 8.9–10.3)
CHLORIDE: 104 mmol/L (ref 101–111)
CO2: 23 mmol/L (ref 22–32)
Creatinine, Ser: 1.34 mg/dL — ABNORMAL HIGH (ref 0.44–1.00)
GFR calc non Af Amer: 36 mL/min — ABNORMAL LOW (ref >60)
GFR, EST AFRICAN AMERICAN: 42 mL/min — AB (ref >60)
Glucose, Bld: 153 mg/dL — ABNORMAL HIGH (ref 65–99)
Potassium: 4.5 mmol/L (ref 3.5–5.1)
SODIUM: 136 mmol/L (ref 135–145)

## 2015-05-06 LAB — TSH: TSH: 16.771 u[IU]/mL — ABNORMAL HIGH (ref 0.350–4.500)

## 2015-05-06 LAB — TROPONIN I: Troponin I: <0.03 ng/mL (ref <0.031)

## 2015-05-06 MED ORDER — SODIUM CHLORIDE 0.9 % IV BOLUS (SEPSIS)
1000.0000 mL | Freq: Once | INTRAVENOUS | Status: AC
  Administered 2015-05-06: 1000 mL via INTRAVENOUS

## 2015-05-06 NOTE — ED Provider Notes (Addendum)
[facility] Emergency Department Provider Note  ____________________________________________  Time seen: Seen upon arrival to the emergency department  I have reviewed the triage vital signs and the nursing notes.   HISTORY  Chief Complaint Fatigue    HPI Vanessa [name] is a 81 y.o. female with a history of colon cancer, in remission, and hypertensionwho presents today with 2 weeks of weakness. She says that the weakness comes on in the afternoons and is worse with walking. She says that she does not have any pain. Has been having episodes of shortness of breath over the past 2 months and has been evaluated by Dr. Clayborn Bigness, her cardiologist, who has cleared her. Says that she is urinating more frequently lately but does not have any burning with urination. No cough or fever at home. One episode of diarrhea over the past 2 weeks.   Past Medical History  Diagnosis Date  . Thyroid disorder   . Colon cancer     There are no active problems to display for this patient.   Past Surgical History  Procedure Laterality Date  . Colon surgery    . Back surgery      Current Outpatient Rx  Name  Route  Sig  Dispense  Refill  . amLODipine (NORVASC) 5 MG tablet   Oral   Take 5 mg by mouth daily.         Marland Kitchen levothyroxine (SYNTHROID) 50 MCG tablet   Oral   Take 1 tablet (50 mcg total) by mouth daily before breakfast.   30 tablet   1   . LORazepam (ATIVAN) 0.5 MG tablet   Oral   Take 1 tablet (0.5 mg total) by mouth 2 (two) times daily. Patient taking differently: Take 0.5 mg by mouth daily as needed for anxiety.    30 tablet   0   . vitamin B-12 (CYANOCOBALAMIN) 1000 MCG tablet   Oral   Take 1,000 mcg by mouth daily.         Marland Kitchen levothyroxine (SYNTHROID, LEVOTHROID) 25 MCG tablet   Oral   Take 1 tablet (25 mcg total) by mouth daily before breakfast. Patient not taking: Reported on 05/06/2015   30 tablet   0     Allergies Benadryl; Codeine;  Oxycodone; and Penicillins  History reviewed. No pertinent family history.  Social History History  Substance Use Topics  . Smoking status: Never Smoker   . Smokeless tobacco: Never Used  . Alcohol Use: No    Review of Systems Constitutional: No fever/chills Eyes: No visual changes. ENT: No sore throat. Cardiovascular: Denies chest pain. Respiratory: Denies shortness of breath. Gastrointestinal: No abdominal pain.  No nausea, no vomiting.  No diarrhea.  No constipation. Genitourinary: Negative for dysuria. Musculoskeletal: Negative for back pain. Skin: Negative for rash. Neurological: Negative for headaches or numbness.  Baseline left-sided weakness secondary to stroke. Denies any worsening of this weakness.  10-point ROS otherwise negative.  ____________________________________________   PHYSICAL EXAM:  VITAL SIGNS: ED Triage Vitals  Enc Vitals Group     BP 05/06/15 1752 91/59 mmHg     Pulse Rate 05/06/15 1752 73     Resp 05/06/15 1752 37     Temp --      Temp src --      SpO2 05/06/15 1752 98 %     Weight --      Height --      Head Cir --      Peak Flow --  Pain Score --      Pain Loc --      Pain Edu? --      Excl. in [place]? --     Constitutional: Alert and oriented. Well appearing and in no acute distress. Eyes: Conjunctivae are normal. PERRL. EOMI. Head: Atraumatic. Nose: No congestion/rhinnorhea. Mouth/Throat: Mucous membranes are moist.  Oropharynx non-erythematous. Neck: No stridor.   Cardiovascular: Normal rate, regular rhythm. Grossly normal heart sounds.  Good peripheral circulation. Respiratory: Normal respiratory effort.  No retractions. Lungs CTAB. Gastrointestinal: Soft and nontender. No distention. No abdominal bruits. No CVA tenderness. Musculoskeletal: No lower extremity tenderness nor edema.  No joint effusions. Neurologic:  Normal speech and language. Left upper and lower extremities with weak 4-5 strength. 5 out of 5 strength  otherwise. No facial asymmetry.  Skin:  Skin is warm, dry and intact. No rash noted. Psychiatric: Mood and affect are normal. Speech and behavior are normal.  ____________________________________________   LABS (all labs ordered are listed, but only abnormal results are displayed)  Labs Reviewed  BASIC METABOLIC PANEL - Abnormal; Notable for the following:    Glucose, Bld 153 (*)    BUN 26 (*)    Creatinine, Ser 1.34 (*)    GFR calc non Af Amer 36 (*)    GFR calc Af Amer 42 (*)    All other components within normal limits  CBC - Abnormal; Notable for the following:    RBC 3.20 (*)    Hemoglobin 11.9 (*)    HCT 33.0 (*)    MCV 103.0 (*)    MCH 37.3 (*)    MCHC 36.2 (*)    All other components within normal limits  URINALYSIS COMPLETEWITH MICROSCOPIC ([facility] ONLY) - Abnormal; Notable for the following:    Color, Urine YELLOW (*)    APPearance HAZY (*)    Leukocytes, UA 1+ (*)    Bacteria, UA FEW (*)    Squamous Epithelial / LPF 0-5 (*)    All other components within normal limits  TSH - Abnormal; Notable for the following:    TSH 16.771 (*)    All other components within normal limits  TROPONIN I  CBG MONITORING, ED   ____________________________________________  EKG  ED ECG REPORT I, Doran Stabler, the attending physician, personally viewed and interpreted this ECG.   Date: 05/06/2015  EKG Time: 1734  Rate: 78  Rhythm: normal sinus rhythm  Axis: Normal axis  Intervals:Incomplete right bundle-branch block  ST&T Change: No ST elevations or depressions. No abnormal T-wave inversions.    ____________________________________________  RADIOLOGY  No acute disease on chest x-ray. I personally reviewed these images. ____________________________________________   PROCEDURES  ____________________________________________   INITIAL IMPRESSION / ASSESSMENT AND PLAN / ED COURSE  Pertinent labs & imaging results that were available during my care of the  patient were reviewed by me and considered in my medical decision making (see chart for details).  ----------------------------------------- 10:11 PM on 05/06/2015 -----------------------------------------  Asian resting comfortable at this time. Found to have elevated TSH. Patient said that she recently had her levothyroxine lower from 100 g to 25. She says that she will call her primary care doctor in the morning who drew blood work today to discuss her thyroid levels and her dosing of her levothyroxine. We attempted to call out to Dr Purnell Shoemaker, however after 3 pages nobody responded. She'll be given her TSH level on her discharge papers today. Feeling better after fluids. Not orthostatic. Unlikely UTI. Discussed urine  again with patient and agrees that does not want a antibiotic at this time. Urine sent for culture. We'll discharge to home. ____________________________________________   FINAL CLINICAL IMPRESSION(S) / ED DIAGNOSES  Acute weakness. Acute hypothyroidism. Initial visit.    Orbie Pyo, MD 05/06/15 2216  Patient with urine culture growing ESBL which is sensitive to [place]. Call patient on the phone and says his feeling improved however still with some "tiredness." However, given these positive culture results I recommended that she take a course of Macrobid.  She said that she goes to the [facility] on [address] and [address]. The patient knows that she will be able to pick up her antibiotics there later this afternoon. I called the pharmacy at the [facility] and gave the prescription for Macrobid 100 mg twice a day for 7 days.  Orbie Pyo, MD 05/09/15 [phone]

## 2015-05-06 NOTE — ED Notes (Signed)
weakness spells x2 weaks, denies pain, from home. Pt A+OX4

## 2015-05-07 LAB — GLUCOSE, CAPILLARY: Glucose-Capillary: 145 mg/dL — ABNORMAL HIGH (ref 65–99)

## 2015-05-09 ENCOUNTER — Telehealth: Payer: Self-pay | Admitting: Emergency Medicine

## 2015-05-09 LAB — URINE CULTURE: Culture: >=100000

## 2015-05-14 ENCOUNTER — Other Ambulatory Visit: Payer: Self-pay | Admitting: *Deleted

## 2015-05-14 DIAGNOSIS — C189 Malignant neoplasm of colon, unspecified: Secondary | ICD-10-CM

## 2015-05-20 ENCOUNTER — Inpatient Hospital Stay (HOSPITAL_BASED_OUTPATIENT_CLINIC_OR_DEPARTMENT_OTHER): Payer: Medicare Other | Admitting: Oncology

## 2015-05-20 ENCOUNTER — Inpatient Hospital Stay: Payer: Medicare Other | Attending: Oncology

## 2015-05-20 VITALS — BP 110/70 | HR 83 | Temp 97.4°F | Resp 18 | Wt 143.2 lb

## 2015-05-20 DIAGNOSIS — Z8781 Personal history of (healed) traumatic fracture: Secondary | ICD-10-CM | POA: Insufficient documentation

## 2015-05-20 DIAGNOSIS — F329 Major depressive disorder, single episode, unspecified: Secondary | ICD-10-CM

## 2015-05-20 DIAGNOSIS — Z8673 Personal history of transient ischemic attack (TIA), and cerebral infarction without residual deficits: Secondary | ICD-10-CM | POA: Diagnosis not present

## 2015-05-20 DIAGNOSIS — D509 Iron deficiency anemia, unspecified: Secondary | ICD-10-CM | POA: Insufficient documentation

## 2015-05-20 DIAGNOSIS — Z9221 Personal history of antineoplastic chemotherapy: Secondary | ICD-10-CM | POA: Diagnosis not present

## 2015-05-20 DIAGNOSIS — Z87440 Personal history of urinary (tract) infections: Secondary | ICD-10-CM | POA: Insufficient documentation

## 2015-05-20 DIAGNOSIS — R531 Weakness: Secondary | ICD-10-CM | POA: Insufficient documentation

## 2015-05-20 DIAGNOSIS — M129 Arthropathy, unspecified: Secondary | ICD-10-CM | POA: Insufficient documentation

## 2015-05-20 DIAGNOSIS — Z79899 Other long term (current) drug therapy: Secondary | ICD-10-CM | POA: Diagnosis not present

## 2015-05-20 DIAGNOSIS — C189 Malignant neoplasm of colon, unspecified: Secondary | ICD-10-CM

## 2015-05-20 DIAGNOSIS — E039 Hypothyroidism, unspecified: Secondary | ICD-10-CM

## 2015-05-20 DIAGNOSIS — R42 Dizziness and giddiness: Secondary | ICD-10-CM | POA: Insufficient documentation

## 2015-05-20 DIAGNOSIS — Z85038 Personal history of other malignant neoplasm of large intestine: Secondary | ICD-10-CM | POA: Insufficient documentation

## 2015-05-20 DIAGNOSIS — Z809 Family history of malignant neoplasm, unspecified: Secondary | ICD-10-CM

## 2015-05-20 DIAGNOSIS — H9190 Unspecified hearing loss, unspecified ear: Secondary | ICD-10-CM | POA: Insufficient documentation

## 2015-05-20 DIAGNOSIS — I2510 Atherosclerotic heart disease of native coronary artery without angina pectoris: Secondary | ICD-10-CM

## 2015-05-20 DIAGNOSIS — I252 Old myocardial infarction: Secondary | ICD-10-CM | POA: Diagnosis not present

## 2015-05-20 DIAGNOSIS — Z993 Dependence on wheelchair: Secondary | ICD-10-CM | POA: Insufficient documentation

## 2015-05-20 LAB — CBC WITH DIFFERENTIAL/PLATELET
BASOS PCT: 0 %
Basophils Absolute: 0 10*3/uL (ref 0–0.1)
Eosinophils Absolute: 0 10*3/uL (ref 0–0.7)
Eosinophils Relative: 0 %
HCT: 35.5 % (ref 35.0–47.0)
HEMOGLOBIN: 12.7 g/dL (ref 12.0–16.0)
LYMPHS PCT: 15 %
Lymphs Abs: 1.2 10*3/uL (ref 1.0–3.6)
MCH: 36.9 pg — AB (ref 26.0–34.0)
MCHC: 35.8 g/dL (ref 32.0–36.0)
MCV: 103 fL — ABNORMAL HIGH (ref 80.0–100.0)
MONOS PCT: 8 %
Monocytes Absolute: 0.7 10*3/uL (ref 0.2–0.9)
NEUTROS PCT: 77 %
Neutro Abs: 6.2 10*3/uL (ref 1.4–6.5)
Platelets: 200 10*3/uL (ref 150–440)
RBC: 3.44 MIL/uL — ABNORMAL LOW (ref 3.80–5.20)
RDW: 13.6 % (ref 11.5–14.5)
WBC: 8.1 10*3/uL (ref 3.6–11.0)

## 2015-05-20 LAB — COMPREHENSIVE METABOLIC PANEL
ALBUMIN: 4.5 g/dL (ref 3.5–5.0)
ALK PHOS: 57 U/L (ref 38–126)
ALT: 11 U/L — ABNORMAL LOW (ref 14–54)
AST: 27 U/L (ref 15–41)
Anion gap: 6 (ref 5–15)
BILIRUBIN TOTAL: 2.2 mg/dL — AB (ref 0.3–1.2)
BUN: 21 mg/dL — ABNORMAL HIGH (ref 6–20)
CALCIUM: 8.8 mg/dL — AB (ref 8.9–10.3)
CO2: 22 mmol/L (ref 22–32)
Chloride: 108 mmol/L (ref 101–111)
Creatinine, Ser: 1.24 mg/dL — ABNORMAL HIGH (ref 0.44–1.00)
GFR calc Af Amer: 46 mL/min — ABNORMAL LOW (ref >60)
GFR calc non Af Amer: 39 mL/min — ABNORMAL LOW (ref >60)
GLUCOSE: 123 mg/dL — AB (ref 65–99)
Potassium: 4.6 mmol/L (ref 3.5–5.1)
Sodium: 136 mmol/L (ref 135–145)
TOTAL PROTEIN: 7 g/dL (ref 6.5–8.1)

## 2015-05-20 NOTE — Progress Notes (Signed)
Patient does not have living will.  Never smoked. Patient c/o feeling tired.

## 2015-05-30 ENCOUNTER — Encounter: Payer: Self-pay | Admitting: Oncology

## 2015-05-30 DIAGNOSIS — E119 Type 2 diabetes mellitus without complications: Secondary | ICD-10-CM | POA: Insufficient documentation

## 2015-05-30 DIAGNOSIS — E039 Hypothyroidism, unspecified: Secondary | ICD-10-CM | POA: Insufficient documentation

## 2015-05-30 DIAGNOSIS — I219 Acute myocardial infarction, unspecified: Secondary | ICD-10-CM | POA: Insufficient documentation

## 2015-05-30 DIAGNOSIS — E538 Deficiency of other specified B group vitamins: Secondary | ICD-10-CM | POA: Insufficient documentation

## 2015-05-30 DIAGNOSIS — E785 Hyperlipidemia, unspecified: Secondary | ICD-10-CM | POA: Insufficient documentation

## 2015-05-30 DIAGNOSIS — E559 Vitamin D deficiency, unspecified: Secondary | ICD-10-CM | POA: Insufficient documentation

## 2015-05-30 DIAGNOSIS — D649 Anemia, unspecified: Secondary | ICD-10-CM | POA: Insufficient documentation

## 2015-05-30 DIAGNOSIS — I639 Cerebral infarction, unspecified: Secondary | ICD-10-CM | POA: Insufficient documentation

## 2015-05-30 DIAGNOSIS — C189 Malignant neoplasm of colon, unspecified: Secondary | ICD-10-CM | POA: Insufficient documentation

## 2015-05-30 NOTE — Progress Notes (Signed)
[place] @ [facility] [phone]  [phone]     CARTHA ROTERT OB: 10/29/1930  MR#: [id number]  KKX#:[id number]  Patient Care Team: Herminio Commons, MD as PCP - General (Family Medicine)  CHIEF COMPLAINT:  Chief Complaint  Patient presents with  . Follow-up   adenocarcinoma of colon   No history exists.    No flowsheet data found.  INTERVAL HISTORY: adenocarcinoma of colon cancer  HPI:   Mrs. Mensinger came today further followup regarding carcinoma of colon status post chemotherapy under protocol.  Patient has finished her 5 years of followup.   Because of cerebrovascular accident patient is in wheelchair and performance status is poor  Patient denies any abdominal pain.  No nausea.  No vomiting.  No diarrhea.  No rectal bleeding.  This is performance status is impaired because of neurological deficit secondary to cerebrovascular accident as well as myocardial infarction  REVIEW OF SYSTEMS:   GENERAL:  She is in wheelchair because of previous cerebrovascular accident PERFORMANCE STATUS (ECOG): 02 HEENT:  No visual changes, runny nose, sore throat, mouth sores or tenderness. Lungs: No shortness of breath or cough.  No hemoptysis. Cardiac:  No chest pain, palpitations, orthopnea, or PND. GI:  No nausea, vomiting, diarrhea, constipation, melena or hematochezia. GU:  No urgency, frequency, dysuria, or hematuria.  Patient was in emergency room with recent urinary tract infection Musculoskeletal:  No back pain.  No joint pain.  No muscle tenderness. Extremities:  No pain or swelling. Skin:  No rashes or skin changes.  Endocrine:  No diabetes, thyroid issues, hot flashes or night sweats. Psych:  No mood changes, depression or anxiety. Pain:  No focal pain. Review of systems:  All other systems reviewed and found to be negative. As per HPI. Otherwise, a complete review of systems is negatve.  PAST MEDICAL HISTORY: Past Medical History  Diagnosis Date  .  Thyroid disorder   . Colon cancer     PAST SURGICAL HISTORY: Past Surgical History  Procedure Laterality Date  . Colon surgery    . 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   Weakness:    syncope: 2012   UTI:    HOH:    Oncology Protocol: NCCTG N0147 Protocol:  This pt. is on Researc study, Stage III Colon Ca FOLFOX +/- Erbitux for Wild-type KRAS, contact Jake Samples, RN @ [phone].   Diabetes: TYPE II   fracured ribs (7):    arthritis:    pneumothorax:    CAD:    iron deficiency anemia:    hypothyroidism:    Colon or Rectal Cancer:    Oncology Protocol: This patient is on a research protocol: NCCTG N0147 receiving folfox treatments every 2 weeks as well as weekly Cetuximab. Protocol contact is Yolande Jolly, RN   Depression:    TIA - Transient Ischemic Attack:    Cholecystectomy: three months ago   right hip fracture and repair:    diskectomy:    back surgery: lumbar fusion   right breast: benign tumors removed from right breast   porta cath: placement and removal 3 months ago   colon: 18 in of colon removed   Appendectomy:    hysterectomy:    Tonsillectomy:   Smoking History: Smoking History Never Smoked.  PFSH: Additional Past Medical and Surgical History: Past, Family and Social History:    Past Medical  History positive    Endocrine hypothyroidism    Past Surgical History sp right orif    Family History positive    Conditions cancer    Family Member with Cancer mother    Social History noncontributory   ADVANCED DIRECTIVES:  Patient does not have any living will or healthcare power of attorney.  Information was given .  Available resources had been discussed.  We will follow-up on subsequent appointments regarding this issue HEALTH MAINTENANCE: Social History  Substance Use Topics  . Smoking  status: Never Smoker   . Smokeless tobacco: Never Used  . Alcohol Use: No      Allergies  Allergen Reactions  . Benadryl [Diphenhydramine Hcl] Other (See Comments)    Pt states that it makes her legs jump.   . Codeine Nausea And Vomiting  . Oxycodone Nausea And Vomiting  . Penicillins Other (See Comments)    Pt states that it does not work for her.     Current Outpatient Prescriptions  Medication Sig Dispense Refill  . amLODipine (NORVASC) 5 MG tablet Take 5 mg by mouth daily.    Marland Kitchen levothyroxine (SYNTHROID) 50 MCG tablet Take 1 tablet (50 mcg total) by mouth daily before breakfast. 30 tablet 1  . levothyroxine (SYNTHROID, LEVOTHROID) 25 MCG tablet Take 1 tablet (25 mcg total) by mouth daily before breakfast. 30 tablet 0  . LORazepam (ATIVAN) 0.5 MG tablet Take 1 tablet (0.5 mg total) by mouth 2 (two) times daily. (Patient taking differently: Take 0.5 mg by mouth daily as needed for anxiety. ) 30 tablet 0  . lovastatin (MEVACOR) 40 MG tablet Take 40 mg by mouth at bedtime.    . vitamin B-12 (CYANOCOBALAMIN) 1000 MCG tablet Take 1,000 mcg by mouth daily.     No current facility-administered medications for this visit.    OBJECTIVE:  Filed Vitals:   05/20/15 1103  BP: 110/70  Pulse: 83  Temp: 97.4 F (36.3 C)  Resp: 18     Body mass index is 23.84 kg/(m^2).    ECOG FS:2 - Symptomatic, <50% confined to bed  PHYSICAL EXAM: GENERAL:  Well developed, well nourished, sitting comfortably in the exam room in no acute distress. MENTAL STATUS:  Alert and oriented to person, place and time.  ENT:  Oropharynx clear without lesion.  Tongue normal. Mucous membranes moist.  RESPIRATORY:  Clear to auscultation without rales, wheezes or rhonchi. CARDIOVASCULAR:  Regular rate and rhythm without murmur, rub or gallop. BREAST:  Right breast without masses, skin changes or nipple discharge.  Left breast without masses, skin changes or nipple discharge. ABDOMEN:  Soft, non-tender, with active  bowel sounds, and no hepatosplenomegaly.  No masses. BACK:  No CVA tenderness.  No tenderness on percussion of the back or rib cage. SKIN:  No rashes, ulcers or lesions. EXTREMITIES: No edema, no skin discoloration or tenderness.  No palpable cords. LYMPH NODES: No palpable cervical, supraclavicular, axillary or inguinal adenopathy   PSYCH:  Appropriate.   LAB RESULTS:  CBC Latest Ref Rng 05/20/2015 05/06/2015  WBC 3.6 - 11.0 K/uL 8.1 7.9  Hemoglobin 12.0 - 16.0 g/dL 12.7 11.9(L)  Hematocrit 35.0 - 47.0 % 35.5 33.0(L)  Platelets 150 - 440 K/uL 200 217    Appointment on 05/20/2015  Component Date Value Ref Range Status  . WBC 05/20/2015 8.1  3.6 - 11.0 K/uL Final  . RBC 05/20/2015 3.44* 3.80 - 5.20 MIL/uL Final  . Hemoglobin 05/20/2015 12.7  12.0 - 16.0 g/dL Final  .  HCT 05/20/2015 35.5  35.0 - 47.0 % Final  . MCV 05/20/2015 103.0* 80.0 - 100.0 fL Final  . MCH 05/20/2015 36.9* 26.0 - 34.0 pg Final  . MCHC 05/20/2015 35.8  32.0 - 36.0 g/dL Final  . RDW 05/20/2015 13.6  11.5 - 14.5 % Final  . Platelets 05/20/2015 200  150 - 440 K/uL Final  . Neutrophils Relative % 05/20/2015 77   Final  . Neutro Abs 05/20/2015 6.2  1.4 - 6.5 K/uL Final  . Lymphocytes Relative 05/20/2015 15   Final  . Lymphs Abs 05/20/2015 1.2  1.0 - 3.6 K/uL Final  . Monocytes Relative 05/20/2015 8   Final  . Monocytes Absolute 05/20/2015 0.7  0.2 - 0.9 K/uL Final  . Eosinophils Relative 05/20/2015 0   Final  . Eosinophils Absolute 05/20/2015 0.0  0 - 0.7 K/uL Final  . Basophils Relative 05/20/2015 0   Final  . Basophils Absolute 05/20/2015 0.0  0 - 0.1 K/uL Final  . Sodium 05/20/2015 136  135 - 145 mmol/L Final  . Potassium 05/20/2015 4.6  3.5 - 5.1 mmol/L Final  . Chloride 05/20/2015 108  101 - 111 mmol/L Final  . CO2 05/20/2015 22  22 - 32 mmol/L Final  . Glucose, Bld 05/20/2015 123* 65 - 99 mg/dL Final  . BUN 05/20/2015 21* 6 - 20 mg/dL Final  . Creatinine, Ser 05/20/2015 1.24* 0.44 - 1.00 mg/dL Final  .  Calcium 05/20/2015 8.8* 8.9 - 10.3 mg/dL Final  . Total Protein 05/20/2015 7.0  6.5 - 8.1 g/dL Final  . Albumin 05/20/2015 4.5  3.5 - 5.0 g/dL Final  . AST 05/20/2015 27  15 - 41 U/L Final  . ALT 05/20/2015 11* 14 - 54 U/L Final  . Alkaline Phosphatase 05/20/2015 57  38 - 126 U/L Final  . Total Bilirubin 05/20/2015 2.2* 0.3 - 1.2 mg/dL Final  . GFR calc non Af Amer 05/20/2015 39* >60 mL/min Final  . GFR calc Af Amer 05/20/2015 46* >60 mL/min Final   Comment: (NOTE) The eGFR has been calculated using the CKD EPI equation. This calculation has not been validated in all clinical situations. eGFR's persistently <60 mL/min signify possible Chronic Kidney Disease.   . Anion gap 05/20/2015 6  5 - 15 Final     \  STUDIES: Dg Chest 2 View  05/06/2015   CLINICAL DATA:  Intermittent weakness and dizziness for 2 weeks.  EXAM: CHEST  2 VIEW  COMPARISON:  Single view of the chest 03/25/2015. CT chest 01/29/2015.  FINDINGS: The lungs are clear. Heart size is normal. No pneumothorax or pleural effusion. No focal bony abnormality.  IMPRESSION: No acute disease.   Electronically Signed   By: Inge Rise M.D.   On: 05/06/2015 18:25    ASSESSMENT: History of carcinoma of colon there is no evidence of recurrent or progressive disease.. Bilirubin is slightly elevated.  But liver enzymes are within acceptable range.  Patient had a recently tract infection    MEDICAL DECISION MAKING:  This and does not want to get any colonoscopy done.  In view of patient's cerebrovascular accident at this point in time patient will be followed in 1 year if needed.  Patient expressed understanding and was in agreement with this plan. She also understands that She can call clinic at any time with any questions, concerns, or complaints.    No matching staging information was found for the patient.  Forest Gleason, MD   05/30/2015 8:05 PM

## 2015-11-14 ENCOUNTER — Encounter: Payer: Self-pay | Admitting: Emergency Medicine

## 2015-11-14 ENCOUNTER — Emergency Department
Admission: EM | Admit: 2015-11-14 | Discharge: 2015-11-14 | Disposition: A | Discharge status: Home / Self Care | Payer: Medicare Other | Attending: Emergency Medicine | Admitting: Emergency Medicine

## 2015-11-14 ENCOUNTER — Emergency Department: Payer: Medicare Other

## 2015-11-14 DIAGNOSIS — E119 Type 2 diabetes mellitus without complications: Secondary | ICD-10-CM | POA: Insufficient documentation

## 2015-11-14 DIAGNOSIS — Z79899 Other long term (current) drug therapy: Secondary | ICD-10-CM | POA: Diagnosis not present

## 2015-11-14 DIAGNOSIS — R42 Dizziness and giddiness: Secondary | ICD-10-CM

## 2015-11-14 DIAGNOSIS — R110 Nausea: Secondary | ICD-10-CM | POA: Diagnosis not present

## 2015-11-14 DIAGNOSIS — Z880 Allergy status to penicillin: Secondary | ICD-10-CM | POA: Insufficient documentation

## 2015-11-14 DIAGNOSIS — E860 Dehydration: Secondary | ICD-10-CM | POA: Diagnosis not present

## 2015-11-14 HISTORY — DX: Type 2 diabetes mellitus without complications: E11.9

## 2015-11-14 HISTORY — DX: Cerebral infarction, unspecified: I63.9

## 2015-11-14 LAB — CBC
HEMATOCRIT: 35.7 % (ref 35.0–47.0)
HEMOGLOBIN: 12.8 g/dL (ref 12.0–16.0)
MCH: 36 pg — ABNORMAL HIGH (ref 26.0–34.0)
MCHC: 35.9 g/dL (ref 32.0–36.0)
MCV: 100.2 fL — ABNORMAL HIGH (ref 80.0–100.0)
Platelets: 199 10*3/uL (ref 150–440)
RBC: 3.57 MIL/uL — ABNORMAL LOW (ref 3.80–5.20)
RDW: 13.9 % (ref 11.5–14.5)
WBC: 7.7 10*3/uL (ref 3.6–11.0)

## 2015-11-14 LAB — COMPREHENSIVE METABOLIC PANEL
ALT: 18 U/L (ref 14–54)
ANION GAP: 8 (ref 5–15)
AST: 40 U/L (ref 15–41)
Albumin: 4.1 g/dL (ref 3.5–5.0)
Alkaline Phosphatase: 70 U/L (ref 38–126)
BUN: 29 mg/dL — ABNORMAL HIGH (ref 6–20)
CALCIUM: 9.6 mg/dL (ref 8.9–10.3)
CHLORIDE: 106 mmol/L (ref 101–111)
CO2: 23 mmol/L (ref 22–32)
Creatinine, Ser: 1.17 mg/dL — ABNORMAL HIGH (ref 0.44–1.00)
GFR calc non Af Amer: 42 mL/min — ABNORMAL LOW (ref >60)
GFR, EST AFRICAN AMERICAN: 49 mL/min — AB (ref >60)
Glucose, Bld: 114 mg/dL — ABNORMAL HIGH (ref 65–99)
Potassium: 4.5 mmol/L (ref 3.5–5.1)
SODIUM: 137 mmol/L (ref 135–145)
Total Bilirubin: 1.8 mg/dL — ABNORMAL HIGH (ref 0.3–1.2)
Total Protein: 7 g/dL (ref 6.5–8.1)

## 2015-11-14 LAB — URINALYSIS COMPLETE WITH MICROSCOPIC (ARMC ONLY)
BACTERIA UA: NONE SEEN
Bilirubin Urine: NEGATIVE
Glucose, UA: NEGATIVE mg/dL
HGB URINE DIPSTICK: NEGATIVE
LEUKOCYTES UA: NEGATIVE
Nitrite: NEGATIVE
PH: 5 (ref 5.0–8.0)
Protein, ur: NEGATIVE mg/dL
RBC / HPF: NONE SEEN RBC/hpf (ref 0–5)
Specific Gravity, Urine: 1.019 (ref 1.005–1.030)

## 2015-11-14 LAB — TROPONIN I

## 2015-11-14 MED ORDER — ONDANSETRON HCL 4 MG/2ML IJ SOLN
4.0000 mg | Freq: Once | INTRAMUSCULAR | Status: AC
  Administered 2015-11-14: 4 mg via INTRAVENOUS
  Filled 2015-11-14: qty 2

## 2015-11-14 MED ORDER — SODIUM CHLORIDE 0.9 % IV BOLUS (SEPSIS)
500.0000 mL | Freq: Once | INTRAVENOUS | Status: AC
  Administered 2015-11-14: 500 mL via INTRAVENOUS

## 2015-11-14 MED ORDER — ONDANSETRON 4 MG PO TBDP: 4.0000 mg | ORAL_TABLET | Freq: Three times a day (TID) | ORAL | Status: DC | PRN

## 2015-11-14 NOTE — ED Provider Notes (Signed)
[facility] Emergency Department Provider Note  ____________________________________________  Time seen: 2:10 PM  I have reviewed the triage vital signs and the nursing notes.   HISTORY  Chief Complaint Weakness and Dizziness    HPI Vanessa [name] is a 82 y.o. female who is in her usual state of health last night and this morning, and then around 11:30 when she first got out of bed, she percent on the edge of the bed for a few minutes and then when she got up to walk down the hall she became dizzy and lightheaded. She also had nausea but no vomiting. She sat down and did not have syncope or fall. Symptoms resolved by the time she arrived to the emergency department. She had not eaten anything today prior to this episode. Denies chest pain. No acute focal numbness tingling weakness vision changes or headaches.    Past Medical History  Diagnosis Date  . Thyroid disorder   . Colon cancer ([place])   . Diabetes mellitus without complication ([place])   . Stroke [facility])      Patient Active Problem List   Diagnosis Date Noted  . Absolute anemia 05/30/2015  . B12 deficiency 05/30/2015  . Cerebral vascular accident ([place]) 05/30/2015  . Cancer ([place]) 05/30/2015  . Diabetes mellitus, type 2 ([place]) 05/30/2015  . HLD (hyperlipidemia) 05/30/2015  . Heart attack ([place]) 05/30/2015  . Adult hypothyroidism 05/30/2015  . Avitaminosis D 05/30/2015  . Enthesopathy of hip 01/05/2014  . Temporary cerebral vascular dysfunction 03/02/2012     Past Surgical History  Procedure Laterality Date  . Colon surgery    . Back surgery    . Tonsillectomy    . Breast surgery       Current Outpatient Rx  Name  Route  Sig  Dispense  Refill  . amLODipine (NORVASC) 5 MG tablet   Oral   Take 5 mg by mouth daily.         Marland Kitchen levothyroxine (SYNTHROID) 50 MCG tablet   Oral   Take 1 tablet (50 mcg total) by mouth daily before breakfast.   30 tablet   1   . levothyroxine (SYNTHROID,  LEVOTHROID) 25 MCG tablet   Oral   Take 1 tablet (25 mcg total) by mouth daily before breakfast.   30 tablet   0   . LORazepam (ATIVAN) 0.5 MG tablet   Oral   Take 1 tablet (0.5 mg total) by mouth 2 (two) times daily. Patient taking differently: Take 0.5 mg by mouth daily as needed for anxiety.    30 tablet   0   . lovastatin (MEVACOR) 40 MG tablet   Oral   Take 40 mg by mouth at bedtime.         . ondansetron (ZOFRAN ODT) 4 MG disintegrating tablet   Oral   Take 1 tablet (4 mg total) by mouth every 8 (eight) hours as needed for nausea or vomiting.   20 tablet   0   . vitamin B-12 (CYANOCOBALAMIN) 1000 MCG tablet   Oral   Take 1,000 mcg by mouth daily.            Allergies Benadryl; Codeine; Oxycodone; and Penicillins   History reviewed. No pertinent family history.  Social History Social History  Substance Use Topics  . Smoking status: Never Smoker   . Smokeless tobacco: Never Used  . Alcohol Use: No    Review of Systems  Constitutional:   No fever or chills. No weight  changes Eyes:   No blurry vision or double vision.  ENT:   No sore throat. Cardiovascular:   No chest pain. Respiratory:   No dyspnea or cough. Gastrointestinal:   Negative for abdominal pain, vomiting and diarrhea.  No BRBPR or melena. Genitourinary:   Negative for dysuria, urinary retention, bloody urine, or difficulty urinating. Musculoskeletal:   Negative for back pain. No joint swelling or pain. Skin:   Negative for rash. Neurological:   Negative for headaches, focal weakness or numbness. Positive dizziness Psychiatric:  No anxiety or depression.   Endocrine:  No hot/cold intolerance, changes in energy, or sleep difficulty.  10-point ROS otherwise negative.  ____________________________________________   PHYSICAL EXAM:  VITAL SIGNS: ED Triage Vitals  Enc Vitals Group     BP 11/14/15 1122 126/63 mmHg     Pulse Rate 11/14/15 1122 83     Resp 11/14/15 1122 20     Temp  11/14/15 1141 98.1 F (36.7 C)     Temp Source 11/14/15 1141 Oral     SpO2 11/14/15 1122 99 %     Weight 11/14/15 1141 142 lb (64.411 kg)     Height 11/14/15 1141 5\' 5"  (1.651 m)     Head Cir --      Peak Flow --      Pain Score --      Pain Loc --      Pain Edu? --      Excl. in [place]? --     Vital signs reviewed, nursing assessments reviewed.   Constitutional:   Alert and oriented. Well appearing and in no distress. Eyes:   No scleral icterus. No conjunctival pallor. PERRL. EOMI ENT   Head:   Normocephalic and atraumatic.   Nose:   No congestion/rhinnorhea. No septal hematoma   Mouth/Throat:   Dry mucous membranes, no pharyngeal erythema. No peritonsillar mass. No uvula shift.   Neck:   No stridor. No SubQ emphysema. No meningismus. Hematological/Lymphatic/Immunilogical:   No cervical lymphadenopathy. Cardiovascular:   RRR. Normal and symmetric distal pulses are present in all extremities. No murmurs, rubs, or gallops. Respiratory:   Normal respiratory effort without tachypnea nor retractions. Breath sounds are clear and equal bilaterally. No wheezes/rales/rhonchi. Gastrointestinal:   Soft and nontender. No distention. There is no CVA tenderness.  No rebound, rigidity, or guarding. Genitourinary:   deferred Musculoskeletal:   Nontender with normal range of motion in all extremities. No joint effusions.  No lower extremity tenderness.  No edema. Neurologic:   Normal speech and language.  CN 2-10 normal. Motor grossly intact. No pronator drift.  Normal gait. No gross focal neurologic deficits are appreciated.  Skin:    Skin is warm, dry and intact. No rash noted.  No petechiae, purpura, or bullae. Psychiatric:   Mood and affect are normal. Speech and behavior are normal. Patient exhibits appropriate insight and judgment.  ____________________________________________    LABS (pertinent positives/negatives) (all labs ordered are listed, but only abnormal results are  displayed) Labs Reviewed  CBC - Abnormal; Notable for the following:    RBC 3.57 (*)    MCV 100.2 (*)    MCH 36.0 (*)    All other components within normal limits  URINALYSIS COMPLETEWITH MICROSCOPIC ([facility] ONLY) - Abnormal; Notable for the following:    Color, Urine YELLOW (*)    APPearance CLEAR (*)    Ketones, ur TRACE (*)    Squamous Epithelial / LPF 0-5 (*)    All other components within normal limits  COMPREHENSIVE METABOLIC PANEL - Abnormal; Notable for the following:    Glucose, Bld 114 (*)    BUN 29 (*)    Creatinine, Ser 1.17 (*)    Total Bilirubin 1.8 (*)    GFR calc non Af Amer 42 (*)    GFR calc Af Amer 49 (*)    All other components within normal limits  TROPONIN I  TROPONIN I   ____________________________________________   EKG  Interpreted by me Sinus rhythm rate of 78, normal axis and intervals. Normal QRS ST segment and T waves.  ____________________________________________    RADIOLOGY  CT head unremarkable  ____________________________________________   PROCEDURES   ____________________________________________   INITIAL IMPRESSION / ASSESSMENT AND PLAN / ED COURSE  Pertinent labs & imaging results that were available during my care of the patient were reviewed by me and considered in my medical decision making (see chart for details).  Patient presents with an episode of dizziness in the setting of prolonged period of not eating or drinking anything and then changing position getting out of bed to walk. In terms of result. Suspicion for stroke or intracranial hemorrhage, infection or sepsis. Workup is negative. Chemistry panel does suggest a slight elevation of BUN and creatinine consistent with dehydration. Given IV fluids, we'll discharge to follow closely with primary care.     ____________________________________________   FINAL CLINICAL IMPRESSION(S) / ED DIAGNOSES  Final diagnoses:  Dizziness   mild dehydration    Carrie Mew, MD 11/14/15 [phone]

## 2015-11-14 NOTE — ED Notes (Signed)
NAD noted at this time. Pt taken to the lobby via wheelchair at this time. Pt denies comments/concerns at this time.

## 2015-11-14 NOTE — ED Notes (Signed)
Pt presents via [facility] with c/o lightheadedness, dizziness, nausea at this time. Per EMS pt states "I just gave out". EMS states patient has a hx of colon cancer, for which is not on chemo, DM, and a previous hx of 2 strokes, pt states L sided deficits from the 2 strokes. Pt presents alert and oriented during triage. NAD noted at this time, skin warm and dry.

## 2015-11-14 NOTE — Discharge Instructions (Signed)

## 2016-02-16 ENCOUNTER — Emergency Department: Payer: Medicare Other

## 2016-02-16 ENCOUNTER — Encounter: Payer: Self-pay | Admitting: Emergency Medicine

## 2016-02-16 ENCOUNTER — Emergency Department
Admission: EM | Admit: 2016-02-16 | Discharge: 2016-02-16 | Disposition: A | Discharge status: Home / Self Care | Payer: Medicare Other | Attending: Student | Admitting: Student

## 2016-02-16 DIAGNOSIS — E039 Hypothyroidism, unspecified: Secondary | ICD-10-CM | POA: Insufficient documentation

## 2016-02-16 DIAGNOSIS — E119 Type 2 diabetes mellitus without complications: Secondary | ICD-10-CM | POA: Insufficient documentation

## 2016-02-16 DIAGNOSIS — Z85038 Personal history of other malignant neoplasm of large intestine: Secondary | ICD-10-CM | POA: Insufficient documentation

## 2016-02-16 DIAGNOSIS — R197 Diarrhea, unspecified: Secondary | ICD-10-CM | POA: Diagnosis not present

## 2016-02-16 DIAGNOSIS — R1110 Vomiting, unspecified: Secondary | ICD-10-CM | POA: Diagnosis not present

## 2016-02-16 DIAGNOSIS — E785 Hyperlipidemia, unspecified: Secondary | ICD-10-CM | POA: Diagnosis not present

## 2016-02-16 DIAGNOSIS — Z79899 Other long term (current) drug therapy: Secondary | ICD-10-CM | POA: Insufficient documentation

## 2016-02-16 DIAGNOSIS — Z8673 Personal history of transient ischemic attack (TIA), and cerebral infarction without residual deficits: Secondary | ICD-10-CM | POA: Insufficient documentation

## 2016-02-16 DIAGNOSIS — R42 Dizziness and giddiness: Secondary | ICD-10-CM | POA: Insufficient documentation

## 2016-02-16 LAB — COMPREHENSIVE METABOLIC PANEL
ALT: 8 U/L — ABNORMAL LOW (ref 14–54)
AST: 19 U/L (ref 15–41)
Albumin: 3.9 g/dL (ref 3.5–5.0)
Alkaline Phosphatase: 61 U/L (ref 38–126)
Anion gap: 6 (ref 5–15)
BILIRUBIN TOTAL: 1.6 mg/dL — AB (ref 0.3–1.2)
BUN: 20 mg/dL (ref 6–20)
CO2: 23 mmol/L (ref 22–32)
Calcium: 8.9 mg/dL (ref 8.9–10.3)
Chloride: 105 mmol/L (ref 101–111)
Creatinine, Ser: 1.22 mg/dL — ABNORMAL HIGH (ref 0.44–1.00)
GFR, EST AFRICAN AMERICAN: 46 mL/min — AB (ref >60)
GFR, EST NON AFRICAN AMERICAN: 40 mL/min — AB (ref >60)
Glucose, Bld: 137 mg/dL — ABNORMAL HIGH (ref 65–99)
POTASSIUM: 4.2 mmol/L (ref 3.5–5.1)
Sodium: 134 mmol/L — ABNORMAL LOW (ref 135–145)
TOTAL PROTEIN: 6.1 g/dL — AB (ref 6.5–8.1)

## 2016-02-16 LAB — URINALYSIS COMPLETE WITH MICROSCOPIC (ARMC ONLY)
Bilirubin Urine: NEGATIVE
GLUCOSE, UA: NEGATIVE mg/dL
Hgb urine dipstick: NEGATIVE
KETONES UR: NEGATIVE mg/dL
NITRITE: NEGATIVE
Protein, ur: NEGATIVE mg/dL
SPECIFIC GRAVITY, URINE: 1.019 (ref 1.005–1.030)
pH: 5 (ref 5.0–8.0)

## 2016-02-16 LAB — CBC WITH DIFFERENTIAL/PLATELET
BASOS PCT: 0 %
Basophils Absolute: 0 10*3/uL (ref 0–0.1)
Eosinophils Absolute: 0 10*3/uL (ref 0–0.7)
Eosinophils Relative: 0 %
HEMATOCRIT: 32 % — AB (ref 35.0–47.0)
Hemoglobin: 11.6 g/dL — ABNORMAL LOW (ref 12.0–16.0)
LYMPHS PCT: 24 %
Lymphs Abs: 1.5 10*3/uL (ref 1.0–3.6)
MCH: 36.5 pg — AB (ref 26.0–34.0)
MCHC: 36.2 g/dL — AB (ref 32.0–36.0)
MCV: 100.9 fL — AB (ref 80.0–100.0)
MONO ABS: 0.8 10*3/uL (ref 0.2–0.9)
MONOS PCT: 13 %
NEUTROS ABS: 4.1 10*3/uL (ref 1.4–6.5)
Neutrophils Relative %: 63 %
Platelets: 202 10*3/uL (ref 150–440)
RBC: 3.18 MIL/uL — ABNORMAL LOW (ref 3.80–5.20)
RDW: 14.1 % (ref 11.5–14.5)
WBC: 6.5 10*3/uL (ref 3.6–11.0)

## 2016-02-16 LAB — LIPASE, BLOOD: LIPASE: 39 U/L (ref 11–51)

## 2016-02-16 LAB — TROPONIN I: Troponin I: <0.03 ng/mL (ref <0.031)

## 2016-02-16 MED ORDER — DIATRIZOATE MEGLUMINE & SODIUM 66-10 % PO SOLN
30.0000 mL | Freq: Once | ORAL | Status: AC
  Administered 2016-02-16: 30 mL via ORAL

## 2016-02-16 MED ORDER — SODIUM CHLORIDE 0.9 % IV BOLUS (SEPSIS)
500.0000 mL | Freq: Once | INTRAVENOUS | Status: AC
  Administered 2016-02-16: 500 mL via INTRAVENOUS

## 2016-02-16 NOTE — ED Notes (Signed)
Pt taken to [facility] then CT.

## 2016-02-16 NOTE — ED Notes (Signed)
Pts daughter Adriana Reams [phone] with updates.

## 2016-02-16 NOTE — ED Notes (Signed)
Pt resting with no complaints

## 2016-02-16 NOTE — ED Notes (Signed)
Pt here via EMS from home with c/o dizziness that began as she was up walking to the bathroom around 1500 today. Pt states she has had 2 previous strokes in the past, decreased use of left arm, hand and leg from previous strokes, however, is able to walk with a walker. Pt is alert and oriented, states she is feeling better and the dizziness has stopped.

## 2016-02-16 NOTE — ED Provider Notes (Signed)
[facility] Emergency Department Provider Note   ____________________________________________  Time seen: Approximately 5:26 PM  I have reviewed the triage vital signs and the nursing notes.   HISTORY  Chief Complaint Dizziness    HPI Vanessa [name] is a 82 y.o. female with history of CVA with residual left-sided deficits, history of treated colon cancer or not currently on any radiation or chemotherapy, diabetes, thyroid disorder who presents for evaluation of lightheadedness today, gradual onset, worse while she was standing and walking to the kitchen, now resolved. Patient reports that she got up to walk to the kitchen with her walker and she began feeling lightheaded and "swimmy headed". She denies room spinning dizziness. She called 911. On their arrival, it was noted to be quite hot in her house. Her vital signs were stable. She says that her husband gets angry at her when she tries to adjust the air conditioner. She denies any chest pain or difficulty breathing, no coughing, sneezing, runny nose. She reports on 02/13/16 and 02/14/2016 she did have some nonbloody nonbilious emesis but she has not had any vomiting over the past 2 days. She did have diarrhea today. She denies any abdominal pain.   Past Medical History  Diagnosis Date  . Thyroid disorder   . Colon cancer ([place])   . Diabetes mellitus without complication ([place])   . Stroke [facility])     Patient Active Problem List   Diagnosis Date Noted  . Absolute anemia 05/30/2015  . B12 deficiency 05/30/2015  . Cerebral vascular accident ([place]) 05/30/2015  . Cancer ([place]) 05/30/2015  . Diabetes mellitus, type 2 ([place]) 05/30/2015  . HLD (hyperlipidemia) 05/30/2015  . Heart attack ([place]) 05/30/2015  . Adult hypothyroidism 05/30/2015  . Avitaminosis D 05/30/2015  . Enthesopathy of hip 01/05/2014  . Temporary cerebral vascular dysfunction 03/02/2012    Past Surgical History  Procedure Laterality Date  .  Colon surgery    . Back surgery    . Tonsillectomy    . Breast surgery      Current Outpatient Rx  Name  Route  Sig  Dispense  Refill  . amLODipine (NORVASC) 5 MG tablet   Oral   Take 5 mg by mouth daily.         Marland Kitchen levothyroxine (SYNTHROID) 50 MCG tablet   Oral   Take 1 tablet (50 mcg total) by mouth daily before breakfast.   30 tablet   1   . levothyroxine (SYNTHROID, LEVOTHROID) 25 MCG tablet   Oral   Take 1 tablet (25 mcg total) by mouth daily before breakfast.   30 tablet   0   . LORazepam (ATIVAN) 0.5 MG tablet   Oral   Take 1 tablet (0.5 mg total) by mouth 2 (two) times daily. Patient taking differently: Take 0.5 mg by mouth daily as needed for anxiety.    30 tablet   0   . lovastatin (MEVACOR) 40 MG tablet   Oral   Take 40 mg by mouth at bedtime.         . ondansetron (ZOFRAN ODT) 4 MG disintegrating tablet   Oral   Take 1 tablet (4 mg total) by mouth every 8 (eight) hours as needed for nausea or vomiting.   20 tablet   0   . vitamin B-12 (CYANOCOBALAMIN) 1000 MCG tablet   Oral   Take 1,000 mcg by mouth daily.           Allergies Benadryl; Codeine; Oxycodone; and Penicillins  No family history on file.  Social History Social History  Substance Use Topics  . Smoking status: Never Smoker   . Smokeless tobacco: Never Used  . Alcohol Use: No    Review of Systems Constitutional: No fever/chills Eyes: No visual changes. ENT: No sore throat. Cardiovascular: Denies chest pain. Respiratory: Denies shortness of breath. Gastrointestinal: No abdominal pain.  No nausea, + vomiting.  + diarrhea.  No constipation. Genitourinary: Negative for dysuria. Musculoskeletal: Negative for back pain. Skin: Negative for rash. Neurological: Negative for headaches, focal weakness or numbness.  10-point ROS otherwise negative.  ____________________________________________   PHYSICAL EXAM:  Filed Vitals:   02/16/16 2112 02/16/16 2119 02/16/16 2130  02/16/16 2200  BP: 113/50  111/48 111/53  Pulse: 60 64 64 65  Temp:      TempSrc:      Resp: 13 20 24 22   Height:      Weight:      SpO2: 99% 100% 100% 99%    VITAL SIGNS: ED Triage Vitals  Enc Vitals Group     BP --      Pulse --      Resp --      Temp --      Temp src --      SpO2 --      Weight --      Height --      Head Cir --      Peak Flow --      Pain Score --      Pain Loc --      Pain Edu? --      Excl. in [place]? --     Constitutional: Alert and oriented. Well appearing and in no acute distress. Eyes: Conjunctivae are normal. PERRL. EOMI. Head: Atraumatic. Nose: No congestion/rhinnorhea. Mouth/Throat: Mucous membranes are moist.  Oropharynx non-erythematous. Neck: No stridor.  Supple without meningismus. Cardiovascular: Normal rate, regular rhythm. Grossly normal heart sounds.  Good peripheral circulation. Respiratory: Normal respiratory effort.  No retractions. Lungs CTAB. Gastrointestinal: Soft and nontender. No distention.  No CVA tenderness. Genitourinary: deferred Musculoskeletal: No lower extremity tenderness nor edema.  No joint effusions. Neurologic:  Normal speech and language. Chronic weakness with contracture in the left upper extremity, chronic weakness in the left lower extremity. 5 out of 5 strength in the right upper and right lower extremity. Normal Finger-nose-finger in the right upper extremity. Cranial nerves II through XII intact. Skin:  Skin is warm, dry and intact. No rash noted. Psychiatric: Mood and affect are normal. Speech and behavior are normal.  ____________________________________________   LABS (all labs ordered are listed, but only abnormal results are displayed)  Labs Reviewed  CBC WITH DIFFERENTIAL/PLATELET - Abnormal; Notable for the following:    RBC 3.18 (*)    Hemoglobin 11.6 (*)    HCT 32.0 (*)    MCV 100.9 (*)    MCH 36.5 (*)    MCHC 36.2 (*)    All other components within normal limits  COMPREHENSIVE METABOLIC  PANEL - Abnormal; Notable for the following:    Sodium 134 (*)    Glucose, Bld 137 (*)    Creatinine, Ser 1.22 (*)    Total Protein 6.1 (*)    ALT 8 (*)    Total Bilirubin 1.6 (*)    GFR calc non Af Amer 40 (*)    GFR calc Af Amer 46 (*)    All other components within normal limits  URINALYSIS COMPLETEWITH MICROSCOPIC ([facility] ONLY) -  Abnormal; Notable for the following:    Color, Urine YELLOW (*)    APPearance CLEAR (*)    Leukocytes, UA TRACE (*)    Bacteria, UA FEW (*)    Squamous Epithelial / LPF 0-5 (*)    All other components within normal limits  LIPASE, BLOOD  TROPONIN I   ____________________________________________  EKG  ED ECG REPORT I, Joanne Gavel, the attending physician, personally viewed and interpreted this ECG.   Date: 02/16/2016  EKG Time: 20:22  Rate: 59  Rhythm: normal sinus rhythm  Axis: normal  Intervals:right bundle branch block  ST&T Change: No acute ST elevation.  ____________________________________________  RADIOLOGY  CT head IMPRESSION: 1. No evidence of acute intracranial abnormality. 2. Old right basal ganglia infarct and chronic small vessel ischemia. 3. Stable small left inferior parafalcine meningioma without significant mass-effect.   3 view abdominal plain films IMPRESSION: Nonobstructive bowel gas pattern.  No acute cardiopulmonary disease.  CT abdomen and pelvis IMPRESSION: 1. No definite acute findings or explanation for the patient's symptoms. 2. Possible distal gastric wall thickening versus incomplete distention. If real, this could reflect gastritis. No surrounding inflammatory changes. 3. Mild dilatation of both renal pelves without ureteral dilatation, urinary tract calculus or secondary signs of ureteral obstruction. This is probably physiologic. ____________________________________________   PROCEDURES  Procedure(s) performed: None  Critical Care performed:  No  ____________________________________________   INITIAL IMPRESSION / ASSESSMENT AND PLAN / ED COURSE  Pertinent labs & imaging results that were available during my care of the patient were reviewed by me and considered in my medical decision making (see chart for details).  Vanessa [name] is a 82 y.o. female with history of CVA with residual left-sided deficits, history of treated colon cancer or not currently on any radiation or chemotherapy, diabetes, thyroid disorder who presents for evaluation of lightheadedness today. On exam, she is generally well-appearing and in no acute distress. Vital signs are stable, she is afebrile. She has chronic left-sided weakness which she reports is unchanged from prior, she does not appear to have any new neurological deficits. We'll finger-nose-finger in the right hand and I doubt posterior circulation stroke. Currently the temperature is 90.1 degrees outside, suspect she may be dehydrated so will give IV fluids. Given her complaints of vomiting recently as well as diarrhea today, we'll obtain screening abdominal labs, urinalysis, we'll obtain chest x-ray, CT head, reassess for disposition and need for additional advanced imaging.  ----------------------------------------- 10:29 PM on 02/16/2016 ----------------------------------------- Labs reviewed. CT notable for mild anemia, hemoglobin 11.6. CMP with mild creatinine elevation, creatinine is 1.2 to however this appears to be close to the patient's baseline. Normal lipase, negative troponin. Urinalysis is not consistent with infection. CT scan of the head was negative for any acute intracranial process. CT of the abdomen and pelvis shows possibly mild gastritis but although there acute changes. Patient reports she feels much better at this time. She is tolerating by mouth without vomiting. We discussed return precautions, need for close PCP follow-up and she is comfortable with the discharge plan. DC  home.  ____________________________________________   FINAL CLINICAL IMPRESSION(S) / ED DIAGNOSES  Final diagnoses:  Lightheaded  Vomiting and diarrhea      NEW MEDICATIONS STARTED DURING THIS VISIT:  New Prescriptions   No medications on file     Note:  This document was prepared using Dragon voice recognition software and may include unintentional dictation errors.    Joanne Gavel, MD 02/16/16 2231

## 2016-05-18 ENCOUNTER — Ambulatory Visit: Payer: Medicare Other | Admitting: Oncology

## 2016-05-18 ENCOUNTER — Other Ambulatory Visit: Payer: Medicare Other

## 2016-05-21 ENCOUNTER — Emergency Department: Payer: Medicare Other

## 2016-05-21 ENCOUNTER — Emergency Department
Admission: EM | Admit: 2016-05-21 | Discharge: 2016-05-21 | Disposition: A | Discharge status: Home / Self Care | Payer: Medicare Other | Attending: Emergency Medicine | Admitting: Emergency Medicine

## 2016-05-21 ENCOUNTER — Encounter: Payer: Self-pay | Admitting: Emergency Medicine

## 2016-05-21 DIAGNOSIS — S79911A Unspecified injury of right hip, initial encounter: Secondary | ICD-10-CM | POA: Diagnosis present

## 2016-05-21 DIAGNOSIS — Z79899 Other long term (current) drug therapy: Secondary | ICD-10-CM | POA: Diagnosis not present

## 2016-05-21 DIAGNOSIS — Z85038 Personal history of other malignant neoplasm of large intestine: Secondary | ICD-10-CM | POA: Insufficient documentation

## 2016-05-21 DIAGNOSIS — Y999 Unspecified external cause status: Secondary | ICD-10-CM | POA: Insufficient documentation

## 2016-05-21 DIAGNOSIS — E119 Type 2 diabetes mellitus without complications: Secondary | ICD-10-CM | POA: Insufficient documentation

## 2016-05-21 DIAGNOSIS — Y929 Unspecified place or not applicable: Secondary | ICD-10-CM | POA: Insufficient documentation

## 2016-05-21 DIAGNOSIS — Z8673 Personal history of transient ischemic attack (TIA), and cerebral infarction without residual deficits: Secondary | ICD-10-CM | POA: Diagnosis not present

## 2016-05-21 DIAGNOSIS — Y939 Activity, unspecified: Secondary | ICD-10-CM | POA: Insufficient documentation

## 2016-05-21 DIAGNOSIS — S7001XA Contusion of right hip, initial encounter: Secondary | ICD-10-CM | POA: Insufficient documentation

## 2016-05-21 DIAGNOSIS — W19XXXA Unspecified fall, initial encounter: Secondary | ICD-10-CM

## 2016-05-21 DIAGNOSIS — W1839XA Other fall on same level, initial encounter: Secondary | ICD-10-CM | POA: Insufficient documentation

## 2016-05-21 DIAGNOSIS — G238 Other specified degenerative diseases of basal ganglia: Secondary | ICD-10-CM | POA: Insufficient documentation

## 2016-05-21 LAB — BASIC METABOLIC PANEL
ANION GAP: 10 (ref 5–15)
BUN: 25 mg/dL — ABNORMAL HIGH (ref 6–20)
CALCIUM: 9.4 mg/dL (ref 8.9–10.3)
CO2: 20 mmol/L — AB (ref 22–32)
CREATININE: 1.15 mg/dL — AB (ref 0.44–1.00)
Chloride: 107 mmol/L (ref 101–111)
GFR calc non Af Amer: 43 mL/min — ABNORMAL LOW (ref >60)
GFR, EST AFRICAN AMERICAN: 50 mL/min — AB (ref >60)
Glucose, Bld: 111 mg/dL — ABNORMAL HIGH (ref 65–99)
Potassium: 4.1 mmol/L (ref 3.5–5.1)
SODIUM: 137 mmol/L (ref 135–145)

## 2016-05-21 LAB — CBC
HCT: 35 % (ref 35.0–47.0)
HEMOGLOBIN: 12.7 g/dL (ref 12.0–16.0)
MCH: 37.3 pg — AB (ref 26.0–34.0)
MCHC: 36.2 g/dL — ABNORMAL HIGH (ref 32.0–36.0)
MCV: 103.1 fL — ABNORMAL HIGH (ref 80.0–100.0)
PLATELETS: 210 10*3/uL (ref 150–440)
RBC: 3.39 MIL/uL — AB (ref 3.80–5.20)
RDW: 14.5 % (ref 11.5–14.5)
WBC: 6 10*3/uL (ref 3.6–11.0)

## 2016-05-21 NOTE — ED Notes (Signed)
Patient daughter called to pick patient up

## 2016-05-21 NOTE — ED Notes (Signed)
Patient able to ambulate per her baseline

## 2016-05-21 NOTE — ED Notes (Signed)
Patient given sandwich tray and diet coke

## 2016-05-21 NOTE — ED Provider Notes (Signed)
[facility] Emergency Department Provider Note        Time seen: ----------------------------------------- 12:31 PM on 05/21/2016 -----------------------------------------    I have reviewed the triage vital signs and the nursing notes.   HISTORY  Chief Complaint Fall and Dizziness    HPI Vanessa [name] is a 82 y.o. female who presents to the ER from home after a fall. Patient states she was trying to get up with her walker and the walker slid out from under her and she fell. She denies loss of consciousness or any head. Patient states she feels weak occasionally, sometimes her legs give way. She was complaining of right hip and leg pain earlier. Patient denies recent illness.   Past Medical History:  Diagnosis Date  . Colon cancer ([place])   . Diabetes mellitus without complication ([place])   . Stroke ([place])   . Thyroid disorder     Patient Active Problem List   Diagnosis Date Noted  . Absolute anemia 05/30/2015  . B12 deficiency 05/30/2015  . Cerebral vascular accident ([place]) 05/30/2015  . Cancer ([place]) 05/30/2015  . Diabetes mellitus, type 2 ([place]) 05/30/2015  . HLD (hyperlipidemia) 05/30/2015  . Heart attack ([place]) 05/30/2015  . Adult hypothyroidism 05/30/2015  . Avitaminosis D 05/30/2015  . Enthesopathy of hip 01/05/2014  . Temporary cerebral vascular dysfunction 03/02/2012    Past Surgical History:  Procedure Laterality Date  . BACK SURGERY    . BREAST SURGERY    . COLON SURGERY    . TONSILLECTOMY      Allergies Benadryl [diphenhydramine hcl]; Codeine; Oxycodone; and Penicillins  Social History Social History  Substance Use Topics  . Smoking status: Never Smoker  . Smokeless tobacco: Never Used  . Alcohol use No    Review of Systems Constitutional: Negative for fever. Cardiovascular: Negative for chest pain. Respiratory: Negative for shortness of breath. Gastrointestinal: Negative for abdominal pain, vomiting and  diarrhea. Genitourinary: Negative for dysuria. Musculoskeletal: Positive for right hip pain Skin: Negative for rash. Neurological: Positive for weakness  10-point ROS otherwise negative.  ____________________________________________   PHYSICAL EXAM:  VITAL SIGNS: ED Triage Vitals  Enc Vitals Group     BP 05/21/16 1219 116/66     Pulse Rate 05/21/16 1219 78     Resp 05/21/16 1219 16     Temp 05/21/16 1219 97.8 F (36.6 C)     Temp Source 05/21/16 1219 Oral     SpO2 05/21/16 1219 99 %     Weight 05/21/16 1220 148 lb (67.1 kg)     Height 05/21/16 1220 5\' 5"  (1.651 m)     Head Circumference --      Peak Flow --      Pain Score --      Pain Loc --      Pain Edu? --      Excl. in [place]? --     Constitutional: Alert and oriented. Well appearing and in no distress. Eyes: Conjunctivae are normal. PERRL. Normal extraocular movements. ENT   Head: Normocephalic and atraumatic.   Nose: No congestion/rhinnorhea.   Mouth/Throat: Mucous membranes are moist.   Neck: No stridor. Cardiovascular: Normal rate, regular rhythm. No murmurs, rubs, or gallops. Respiratory: Normal respiratory effort without tachypnea nor retractions. Breath sounds are clear and equal bilaterally. No wheezes/rales/rhonchi. Gastrointestinal: Soft and nontender. Normal bowel sounds Musculoskeletal: Right hip is mildly tender to touch, not grossly shortened or rotated. Neurologic:  Normal speech and language. No gross focal neurologic deficits are appreciated.  Skin:  Skin is warm, dry and intact. No rash noted. Psychiatric: Mood and affect are normal. Speech and behavior are normal.  ____________________________________________  EKG: Interpreted by me. Sinus rhythm with a rate of 79 bpm, normal PR interval, normal QRS, normal QT interval. Normal axis.  ____________________________________________  ED COURSE:  Pertinent labs & imaging results that were available during my care of the patient were  reviewed by me and considered in my medical decision making (see chart for details). Clinical Course  Patient presents to the ER after an unwitnessed fall. We will obtain basic labs and x-rays.  Procedures ____________________________________________   LABS (pertinent positives/negatives)  Labs Reviewed  BASIC METABOLIC PANEL - Abnormal; Notable for the following:       Result Value   CO2 20 (*)    Glucose, Bld 111 (*)    BUN 25 (*)    Creatinine, Ser 1.15 (*)    GFR calc non Af Amer 43 (*)    GFR calc Af Amer 50 (*)    All other components within normal limits  CBC - Abnormal; Notable for the following:    RBC 3.39 (*)    MCV 103.1 (*)    MCH 37.3 (*)    MCHC 36.2 (*)    All other components within normal limits  URINALYSIS COMPLETEWITH MICROSCOPIC ([facility] ONLY)    RADIOLOGY Images were viewed by me  CT head, right hip x-rays Are grossly unremarkable ____________________________________________  FINAL ASSESSMENT AND PLAN  Fall, right hip contusion  Plan: Patient with labs and imaging as dictated above. Patient's in no distress, does not appear to have sustained any significant injuries from the fall. She is stable to return home.   Earleen Newport, MD   Note: This dictation was prepared with Dragon dictation. Any transcriptional errors that result from this process are unintentional    Earleen Newport, MD 05/21/16 1436

## 2016-05-21 NOTE — ED Triage Notes (Signed)
Patient brought in by [facility] from home for a fall. Patient states that she was trying to get up with her walker and the walker slide out from under her and she fell. Patient denies LOC or hitting head. Patient currently c/o right hip and leg pain.

## 2016-05-29 ENCOUNTER — Other Ambulatory Visit: Payer: Self-pay | Admitting: *Deleted

## 2016-05-29 DIAGNOSIS — Z85038 Personal history of other malignant neoplasm of large intestine: Secondary | ICD-10-CM

## 2016-05-29 NOTE — Progress Notes (Signed)
[facility]  [phone] [phone]) [phone]  ID: Vanessa [name] OB: [birth year]  MR#: [id number]  [id number]  Patient Care Team: Herminio Commons, MD as PCP - General (Family Medicine)  CHIEF COMPLAINT: Colon cancer  INTERVAL HISTORY: Patient is an 82 year old female with a distant history of colon cancer greater than 5 years ago. She returns to clinic for routine yearly follow-up. She was recently in the emergency room with dizziness, but did not require admission. She has a history of CVA with residual left-sided weakness. She otherwise feels well. She denies any recent fevers. She denies any chest pain or shortness of breath. She has no nausea, vomiting, constipation, or diarrhea. She has no melena or hematochezia. She has no urinary complaints. Patient otherwise feels well and offers no further specific complaints.  REVIEW OF SYSTEMS:   Review of Systems  Constitutional: Positive for malaise/fatigue. Negative for fever and weight loss.  Respiratory: Negative.  Negative for cough and shortness of breath.   Cardiovascular: Negative.  Negative for chest pain.  Gastrointestinal: Negative.  Negative for abdominal pain, blood in stool and melena.  Genitourinary: Negative.   Musculoskeletal: Negative.   Neurological: Positive for dizziness, focal weakness and weakness.  Psychiatric/Behavioral: Negative.  The patient is not nervous/anxious.     As per HPI. Otherwise, a complete review of systems is negative.  PAST MEDICAL HISTORY: Past Medical History:  Diagnosis Date  . Colon cancer ([place])   . Diabetes mellitus without complication ([place])   . Stroke ([place])   . Thyroid disorder     PAST SURGICAL HISTORY: Past Surgical History:  Procedure Laterality Date  . BACK SURGERY    . BREAST SURGERY    . COLON SURGERY    . TONSILLECTOMY      FAMILY HISTORY: Reviewed and unchanged. No reported history of malignancy or chronic disease.     ADVANCED  DIRECTIVES (Y/N):  N   HEALTH MAINTENANCE: Social History  Substance Use Topics  . Smoking status: Never Smoker  . Smokeless tobacco: Never Used  . Alcohol use No     Colonoscopy:  PAP:  Bone density:  Lipid panel:  Allergies  Allergen Reactions  . Benadryl [Diphenhydramine Hcl] Other (See Comments)    Pt states that it makes her legs jump.   . Codeine Nausea And Vomiting  . Oxycodone Nausea And Vomiting  . Penicillins Other (See Comments)    Pt states that it does not work for her.     Current Outpatient Prescriptions  Medication Sig Dispense Refill  . amLODipine (NORVASC) 5 MG tablet Take 5 mg by mouth every morning.     Marland Kitchen levothyroxine (SYNTHROID) 50 MCG tablet Take 1 tablet (50 mcg total) by mouth daily before breakfast. 30 tablet 1  . levothyroxine (SYNTHROID, LEVOTHROID) 25 MCG tablet Take 1 tablet (25 mcg total) by mouth daily before breakfast. 30 tablet 0  . levothyroxine (SYNTHROID, LEVOTHROID) 75 MCG tablet Take 75 mcg by mouth daily before breakfast.    . ondansetron (ZOFRAN ODT) 4 MG disintegrating tablet Take 1 tablet (4 mg total) by mouth every 8 (eight) hours as needed for nausea or vomiting. 20 tablet 0   No current facility-administered medications for this visit.     OBJECTIVE: There were no vitals filed for this visit.   There is no height or weight on file to calculate BMI.    ECOG FS:2 - Symptomatic, <50% confined to bed  General: Well-developed, well-nourished, no acute distress. Eyes: Pink conjunctiva,  anicteric sclera. HEENT: Normocephalic, moist mucous membranes, clear oropharnyx. Lungs: Clear to auscultation bilaterally. Heart: Regular rate and rhythm. No rubs, murmurs, or gallops. Abdomen: Soft, nontender, nondistended. No organomegaly noted, normoactive bowel sounds. Musculoskeletal: No edema, cyanosis, or clubbing. Neuro: Alert, answering all questions appropriately. Cranial nerves grossly intact. Skin: No rashes or petechiae noted. Psych:  Normal affect. Lymphatics: No cervical, calvicular, axillary or inguinal LAD.   LAB RESULTS:  Lab Results  Component Value Date   NA 137 05/21/2016   K 4.1 05/21/2016   CL 107 05/21/2016   CO2 20 (L) 05/21/2016   GLUCOSE 111 (H) 05/21/2016   BUN 25 (H) 05/21/2016   CREATININE 1.15 (H) 05/21/2016   CALCIUM 9.4 05/21/2016   PROT 6.1 (L) 02/16/2016   ALBUMIN 3.9 02/16/2016   AST 19 02/16/2016   ALT 8 (L) 02/16/2016   ALKPHOS 61 02/16/2016   BILITOT 1.6 (H) 02/16/2016   GFRNONAA 43 (L) 05/21/2016   GFRAA 50 (L) 05/21/2016    Lab Results  Component Value Date   WBC 6.0 05/21/2016   NEUTROABS 4.1 02/16/2016   HGB 12.7 05/21/2016   HCT 35.0 05/21/2016   MCV 103.1 (H) 05/21/2016   PLT 210 05/21/2016   Lab Results  Component Value Date   CEA 4.8 (H) 05/30/2016     STUDIES: Ct Head Wo Contrast  Result Date: 05/21/2016 CLINICAL DATA:  [place] trying to stand. EXAM: CT HEAD WITHOUT CONTRAST TECHNIQUE: Contiguous axial images were obtained from the base of the skull through the vertex without intravenous contrast. COMPARISON:  02/16/2016 FINDINGS: Brain: Generalized atrophy. No sign of acute infarction. Old infarction right basal ganglia. No mass, hemorrhage, hydrocephalus or extra-axial collection. Vascular: There is atherosclerotic calcification of the major vessels at the base of the brain. Skull: No fracture. Sinuses/Orbits: Clear. Other: None IMPRESSION: No acute or traumatic finding.  Old infarction right basal ganglia. Electronically Signed   By: Nelson Chimes M.D.   On: 05/21/2016 13:35   Dg Hip Unilat W Or Wo Pelvis 2-3 Views Right  Result Date: 05/21/2016 CLINICAL DATA:  Fall today, right hip pain EXAM: DG HIP (WITH OR WITHOUT PELVIS) 2-3V RIGHT COMPARISON:  02/16/2016 FINDINGS: Three views of the right hip submitted. There is diffuse osteopenia. Metallic fixation material proximal right femur again noted with anatomic alignment. No acute fracture or subluxation. Degenerative  changes are noted pubic symphysis. There is old fracture deformity of the right inferior pubic ramus. IMPRESSION: No acute fracture or subluxation. Degenerative changes pubic symphysis. Diffuse osteopenia. Stable postsurgical changes of right femur. Electronically Signed   By: Lahoma Crocker M.D.   On: 05/21/2016 14:04    ASSESSMENT: Stage III colon cancer, unclear location.   PLAN:    1.  Colon cancer: Patient is now greater than 5 years removed from her colon cancer. Patient reportedly received adjuvant FOLFOX therapy as well as weekly Cetuximab on clinical trial. She did not have a follow-up colonoscopy given her advanced age, poor performance status, and history of CVA. Patient's CEA is mildly elevated, but relatively unchanged. No further intervention is needed. After lengthy discussion with the patient, no further follow-up is necessary. 2. Dizziness: Unclear etiology, resolved. 3. Renal insufficiency: Mild, monitor.  Approximately 30 minutes was spent in discussion of which greater than 50% was consultation.  Patient expressed understanding and was in agreement with this plan. She also understands that She can call clinic at any time with any questions, concerns, or complaints.   No matching staging information was found  for the patient.  Lloyd Huger, MD   05/29/2016 10:41 PM

## 2016-05-30 ENCOUNTER — Inpatient Hospital Stay: Payer: Medicare Other | Attending: Oncology | Admitting: Oncology

## 2016-05-30 ENCOUNTER — Encounter: Payer: Self-pay | Admitting: Oncology

## 2016-05-30 ENCOUNTER — Inpatient Hospital Stay: Payer: Medicare Other

## 2016-05-30 VITALS — BP 109/72 | HR 94 | Temp 96.0°F | Resp 19 | Ht 65.0 in | Wt 143.8 lb

## 2016-05-30 DIAGNOSIS — E119 Type 2 diabetes mellitus without complications: Secondary | ICD-10-CM

## 2016-05-30 DIAGNOSIS — Z85038 Personal history of other malignant neoplasm of large intestine: Secondary | ICD-10-CM | POA: Insufficient documentation

## 2016-05-30 DIAGNOSIS — Z79899 Other long term (current) drug therapy: Secondary | ICD-10-CM | POA: Diagnosis not present

## 2016-05-30 DIAGNOSIS — Z8673 Personal history of transient ischemic attack (TIA), and cerebral infarction without residual deficits: Secondary | ICD-10-CM | POA: Diagnosis not present

## 2016-05-30 DIAGNOSIS — R531 Weakness: Secondary | ICD-10-CM | POA: Diagnosis not present

## 2016-05-30 DIAGNOSIS — R5383 Other fatigue: Secondary | ICD-10-CM | POA: Insufficient documentation

## 2016-05-30 DIAGNOSIS — E079 Disorder of thyroid, unspecified: Secondary | ICD-10-CM | POA: Diagnosis not present

## 2016-05-30 DIAGNOSIS — N2889 Other specified disorders of kidney and ureter: Secondary | ICD-10-CM | POA: Diagnosis not present

## 2016-05-30 DIAGNOSIS — C189 Malignant neoplasm of colon, unspecified: Secondary | ICD-10-CM

## 2016-05-30 LAB — COMPREHENSIVE METABOLIC PANEL
ALBUMIN: 4.8 g/dL (ref 3.5–5.0)
ALK PHOS: 63 U/L (ref 38–126)
ALT: 7 U/L — ABNORMAL LOW (ref 14–54)
ANION GAP: 6 (ref 5–15)
AST: 20 U/L (ref 15–41)
BUN: 18 mg/dL (ref 6–20)
CALCIUM: 9.3 mg/dL (ref 8.9–10.3)
CHLORIDE: 104 mmol/L (ref 101–111)
CO2: 23 mmol/L (ref 22–32)
Creatinine, Ser: 1.15 mg/dL — ABNORMAL HIGH (ref 0.44–1.00)
GFR calc non Af Amer: 43 mL/min — ABNORMAL LOW (ref >60)
GFR, EST AFRICAN AMERICAN: 50 mL/min — AB (ref >60)
GLUCOSE: 110 mg/dL — AB (ref 65–99)
POTASSIUM: 3.9 mmol/L (ref 3.5–5.1)
SODIUM: 133 mmol/L — AB (ref 135–145)
Total Bilirubin: 2.1 mg/dL — ABNORMAL HIGH (ref 0.3–1.2)
Total Protein: 7.6 g/dL (ref 6.5–8.1)

## 2016-05-30 LAB — CBC WITH DIFFERENTIAL/PLATELET
Basophils Absolute: 0 10*3/uL (ref 0–0.1)
EOS ABS: 0 10*3/uL (ref 0–0.7)
HCT: 37.2 % (ref 35.0–47.0)
HEMOGLOBIN: 13.7 g/dL (ref 12.0–16.0)
LYMPHS ABS: 1.4 10*3/uL (ref 1.0–3.6)
Lymphocytes Relative: 17 %
MCH: 37.5 pg — AB (ref 26.0–34.0)
MCHC: 36.7 g/dL — AB (ref 32.0–36.0)
MCV: 102.1 fL — ABNORMAL HIGH (ref 80.0–100.0)
Monocytes Absolute: 0.7 10*3/uL (ref 0.2–0.9)
Neutro Abs: 6 10*3/uL (ref 1.4–6.5)
PLATELETS: 238 10*3/uL (ref 150–440)
RBC: 3.64 MIL/uL — ABNORMAL LOW (ref 3.80–5.20)
RDW: 14 % (ref 11.5–14.5)
WBC: 8 10*3/uL (ref 3.6–11.0)

## 2016-05-30 NOTE — Progress Notes (Signed)
Pt had recent fall last week no injuries had CT and xray.  Pt reports SOB on exertion and fatigue as main issues.  Fall precautions bracelet placed

## 2016-05-31 LAB — CEA: CEA: 4.8 ng/mL — AB (ref 0.0–4.7)

## 2016-06-23 ENCOUNTER — Observation Stay: Payer: Medicare Other

## 2016-06-23 ENCOUNTER — Encounter: Payer: Self-pay | Admitting: Emergency Medicine

## 2016-06-23 ENCOUNTER — Observation Stay
Admission: EM | Admit: 2016-06-23 | Discharge: 2016-06-24 | Disposition: A | Discharge status: Home / Self Care | Payer: Medicare Other | Attending: Internal Medicine | Admitting: Internal Medicine

## 2016-06-23 ENCOUNTER — Emergency Department: Payer: Medicare Other

## 2016-06-23 DIAGNOSIS — Z803 Family history of malignant neoplasm of breast: Secondary | ICD-10-CM | POA: Diagnosis not present

## 2016-06-23 DIAGNOSIS — E559 Vitamin D deficiency, unspecified: Secondary | ICD-10-CM | POA: Insufficient documentation

## 2016-06-23 DIAGNOSIS — R350 Frequency of micturition: Secondary | ICD-10-CM | POA: Insufficient documentation

## 2016-06-23 DIAGNOSIS — M769 Unspecified enthesopathy, lower limb, excluding foot: Secondary | ICD-10-CM | POA: Diagnosis not present

## 2016-06-23 DIAGNOSIS — Z833 Family history of diabetes mellitus: Secondary | ICD-10-CM | POA: Diagnosis not present

## 2016-06-23 DIAGNOSIS — Z23 Encounter for immunization: Secondary | ICD-10-CM | POA: Insufficient documentation

## 2016-06-23 DIAGNOSIS — Z823 Family history of stroke: Secondary | ICD-10-CM | POA: Diagnosis not present

## 2016-06-23 DIAGNOSIS — E538 Deficiency of other specified B group vitamins: Secondary | ICD-10-CM | POA: Insufficient documentation

## 2016-06-23 DIAGNOSIS — Z8249 Family history of ischemic heart disease and other diseases of the circulatory system: Secondary | ICD-10-CM | POA: Insufficient documentation

## 2016-06-23 DIAGNOSIS — E119 Type 2 diabetes mellitus without complications: Secondary | ICD-10-CM | POA: Insufficient documentation

## 2016-06-23 DIAGNOSIS — Z888 Allergy status to other drugs, medicaments and biological substances status: Secondary | ICD-10-CM | POA: Insufficient documentation

## 2016-06-23 DIAGNOSIS — I69320 Aphasia following cerebral infarction: Secondary | ICD-10-CM | POA: Diagnosis not present

## 2016-06-23 DIAGNOSIS — I6523 Occlusion and stenosis of bilateral carotid arteries: Secondary | ICD-10-CM | POA: Insufficient documentation

## 2016-06-23 DIAGNOSIS — Z85038 Personal history of other malignant neoplasm of large intestine: Secondary | ICD-10-CM | POA: Insufficient documentation

## 2016-06-23 DIAGNOSIS — E782 Mixed hyperlipidemia: Secondary | ICD-10-CM | POA: Insufficient documentation

## 2016-06-23 DIAGNOSIS — Z7982 Long term (current) use of aspirin: Secondary | ICD-10-CM | POA: Insufficient documentation

## 2016-06-23 DIAGNOSIS — Z885 Allergy status to narcotic agent status: Secondary | ICD-10-CM | POA: Insufficient documentation

## 2016-06-23 DIAGNOSIS — M6281 Muscle weakness (generalized): Secondary | ICD-10-CM

## 2016-06-23 DIAGNOSIS — I252 Old myocardial infarction: Secondary | ICD-10-CM | POA: Insufficient documentation

## 2016-06-23 DIAGNOSIS — I69354 Hemiplegia and hemiparesis following cerebral infarction affecting left non-dominant side: Secondary | ICD-10-CM | POA: Diagnosis not present

## 2016-06-23 DIAGNOSIS — E039 Hypothyroidism, unspecified: Secondary | ICD-10-CM | POA: Insufficient documentation

## 2016-06-23 DIAGNOSIS — R319 Hematuria, unspecified: Secondary | ICD-10-CM | POA: Insufficient documentation

## 2016-06-23 DIAGNOSIS — Z880 Allergy status to penicillin: Secondary | ICD-10-CM | POA: Diagnosis not present

## 2016-06-23 DIAGNOSIS — I4510 Unspecified right bundle-branch block: Secondary | ICD-10-CM | POA: Insufficient documentation

## 2016-06-23 DIAGNOSIS — I739 Peripheral vascular disease, unspecified: Secondary | ICD-10-CM | POA: Insufficient documentation

## 2016-06-23 DIAGNOSIS — G459 Transient cerebral ischemic attack, unspecified: Principal | ICD-10-CM | POA: Insufficient documentation

## 2016-06-23 DIAGNOSIS — D649 Anemia, unspecified: Secondary | ICD-10-CM | POA: Diagnosis not present

## 2016-06-23 DIAGNOSIS — I340 Nonrheumatic mitral (valve) insufficiency: Secondary | ICD-10-CM | POA: Insufficient documentation

## 2016-06-23 LAB — CBC WITH DIFFERENTIAL/PLATELET
BASOS PCT: 0 %
Basophils Absolute: 0 10*3/uL (ref 0–0.1)
Eosinophils Absolute: 0 10*3/uL (ref 0–0.7)
Eosinophils Relative: 0 %
HEMATOCRIT: 32.8 % — AB (ref 35.0–47.0)
HEMOGLOBIN: 13 g/dL (ref 12.0–16.0)
LYMPHS ABS: 1.1 10*3/uL (ref 1.0–3.6)
Lymphocytes Relative: 14 %
MCH: 38.5 pg — ABNORMAL HIGH (ref 26.0–34.0)
MCHC: UNDETERMINED g/dL (ref 32.0–36.0)
MCV: 101.9 fL — ABNORMAL HIGH (ref 80.0–100.0)
MONOS PCT: 9 %
Monocytes Absolute: 0.7 10*3/uL (ref 0.2–0.9)
NEUTROS ABS: 6 10*3/uL (ref 1.4–6.5)
NEUTROS PCT: 77 %
Platelets: 244 10*3/uL (ref 150–440)
RBC: 3.37 MIL/uL — ABNORMAL LOW (ref 3.80–5.20)
RDW: 14.5 % (ref 11.5–14.5)
WBC: 7.8 10*3/uL (ref 3.6–11.0)

## 2016-06-23 LAB — COMPREHENSIVE METABOLIC PANEL
ALBUMIN: 4.3 g/dL (ref 3.5–5.0)
ALK PHOS: 63 U/L (ref 38–126)
ALT: 7 U/L — AB (ref 14–54)
ANION GAP: 6 (ref 5–15)
AST: 20 U/L (ref 15–41)
BILIRUBIN TOTAL: 2.5 mg/dL — AB (ref 0.3–1.2)
BUN: 19 mg/dL (ref 6–20)
CALCIUM: 9.3 mg/dL (ref 8.9–10.3)
CO2: 23 mmol/L (ref 22–32)
CREATININE: 1.07 mg/dL — AB (ref 0.44–1.00)
Chloride: 108 mmol/L (ref 101–111)
GFR calc Af Amer: 54 mL/min — ABNORMAL LOW (ref >60)
GFR calc non Af Amer: 47 mL/min — ABNORMAL LOW (ref >60)
GLUCOSE: 101 mg/dL — AB (ref 65–99)
Potassium: 4.4 mmol/L (ref 3.5–5.1)
Sodium: 137 mmol/L (ref 135–145)
TOTAL PROTEIN: 6.8 g/dL (ref 6.5–8.1)

## 2016-06-23 LAB — URINALYSIS COMPLETE WITH MICROSCOPIC (ARMC ONLY)
Bacteria, UA: NONE SEEN
Bilirubin Urine: NEGATIVE
Glucose, UA: NEGATIVE mg/dL
Hgb urine dipstick: NEGATIVE
KETONES UR: NEGATIVE mg/dL
LEUKOCYTES UA: NEGATIVE
NITRITE: NEGATIVE
PH: 6 (ref 5.0–8.0)
PROTEIN: NEGATIVE mg/dL
SPECIFIC GRAVITY, URINE: 1.012 (ref 1.005–1.030)

## 2016-06-23 LAB — TROPONIN I: Troponin I: <0.03 ng/mL (ref <0.03)

## 2016-06-23 LAB — MAGNESIUM: MAGNESIUM: 2.1 mg/dL (ref 1.7–2.4)

## 2016-06-23 MED ORDER — ASPIRIN 325 MG PO TABS
325.0000 mg | ORAL_TABLET | Freq: Every day | ORAL | Status: DC
  Administered 2016-06-23 – 2016-06-24 (×2): 325 mg via ORAL
  Filled 2016-06-23 (×2): qty 1

## 2016-06-23 MED ORDER — STROKE: EARLY STAGES OF RECOVERY BOOK
Freq: Once | Status: AC
  Administered 2016-06-23: 14:00:00

## 2016-06-23 MED ORDER — METOPROLOL TARTRATE 5 MG/5ML IV SOLN: 5.0000 mg | INTRAVENOUS | Status: DC | PRN

## 2016-06-23 MED ORDER — ACETAMINOPHEN 325 MG PO TABS: 650.0000 mg | ORAL_TABLET | ORAL | Status: DC | PRN

## 2016-06-23 MED ORDER — ENOXAPARIN SODIUM 40 MG/0.4ML ~~LOC~~ SOLN
40.0000 mg | SUBCUTANEOUS | Status: DC
  Administered 2016-06-23: 40 mg via SUBCUTANEOUS
  Filled 2016-06-23: qty 0.4

## 2016-06-23 MED ORDER — AMLODIPINE BESYLATE 5 MG PO TABS
5.0000 mg | ORAL_TABLET | ORAL | Status: DC
  Administered 2016-06-23 – 2016-06-24 (×2): 5 mg via ORAL
  Filled 2016-06-23 (×2): qty 1

## 2016-06-23 MED ORDER — INFLUENZA VAC SPLIT QUAD 0.5 ML IM SUSY
0.5000 mL | PREFILLED_SYRINGE | INTRAMUSCULAR | Status: AC
  Administered 2016-06-24: 0.5 mL via INTRAMUSCULAR
  Filled 2016-06-23: qty 0.5

## 2016-06-23 MED ORDER — ASPIRIN 300 MG RE SUPP: 300.0000 mg | Freq: Every day | RECTAL | Status: DC

## 2016-06-23 MED ORDER — LEVOTHYROXINE SODIUM 150 MCG PO TABS
75.0000 ug | ORAL_TABLET | Freq: Every day | ORAL | Status: DC
  Administered 2016-06-24: 75 ug via ORAL
  Filled 2016-06-23: qty 1

## 2016-06-23 MED ORDER — ACETAMINOPHEN 650 MG RE SUPP: 650.0000 mg | RECTAL | Status: DC | PRN

## 2016-06-23 MED ORDER — ATORVASTATIN CALCIUM 20 MG PO TABS
20.0000 mg | ORAL_TABLET | Freq: Every day | ORAL | Status: DC
  Administered 2016-06-23: 17:00:00 20 mg via ORAL
  Filled 2016-06-23: qty 1

## 2016-06-23 MED ORDER — SODIUM CHLORIDE 0.9 % IV BOLUS (SEPSIS)
500.0000 mL | Freq: Once | INTRAVENOUS | Status: AC
  Administered 2016-06-23: 17:00:00 500 mL via INTRAVENOUS

## 2016-06-23 MED ORDER — GADOBENATE DIMEGLUMINE 529 MG/ML IV SOLN
15.0000 mL | Freq: Once | INTRAVENOUS | Status: AC | PRN
  Administered 2016-06-23: 13 mL via INTRAVENOUS

## 2016-06-23 MED ORDER — ONDANSETRON HCL 4 MG PO TABS: 4.0000 mg | ORAL_TABLET | Freq: Three times a day (TID) | ORAL | Status: DC | PRN

## 2016-06-23 MED ORDER — SENNOSIDES-DOCUSATE SODIUM 8.6-50 MG PO TABS: 1.0000 | ORAL_TABLET | Freq: Every evening | ORAL | Status: DC | PRN

## 2016-06-23 NOTE — Progress Notes (Signed)
episode of SVT Ordered MG level METOPROL PRN BP improved now that HR improved.

## 2016-06-23 NOTE — Consult Note (Signed)
Referring Physician: Mody    Chief Complaint: Episode of unresponsiveness  HPI: Vanessa [name] is an 82 y.o. female who was brought to the ED per EMS for an episode of unresponsiveness.  Patient is amnestic of the event and no family in the room.  All history obtained from the chart.  Husband tried to wake her up around 7:00 on morning of admission.  Patient was unresponsive. He then proceeded to call EMS. When EMS arrived apparently she was awake however she could not get any have her words out. By the time she arrived to the emergency room she was able to speak. She has baseline left-sided weakness from her previous stroke. She also says on Wednesday she was walking to her car using her walker when all of a sudden she was very weak and she was asking for help to get into her car. By the time she arrived at home she was back to her baseline. She denies headaches or visual changes. Of note Her husband had driven a car home that day.  Patient reports that her husband is very mean to her and he brings on these events.    Date last known well: Date: 06/22/2016 Time last known well: Time: 21:00 tPA Given: No: Outside time window, resolution of symptoms  Past Medical History:  Diagnosis Date  . Colon cancer ([place])   . Diabetes mellitus without complication ([address])   . Stroke ([place])   . Thyroid disorder     Past Surgical History:  Procedure Laterality Date  . BACK SURGERY    . BREAST SURGERY    . COLON SURGERY    . TONSILLECTOMY      Family history: Two brothers with DM.  Father deceased from CAD.  Mother deceased from breast cancer  Social History:  reports that she has never smoked. She has never used smokeless tobacco. She reports that she does not drink alcohol or use drugs.  Allergies:  Allergies  Allergen Reactions  . Benadryl [Diphenhydramine Hcl] Other (See Comments)    Pt states that it makes her legs jump.   . Codeine Nausea And Vomiting  . Oxycodone Nausea And Vomiting and Other  (See Comments)    "Makes me real irritable and I feel terrible."  . Penicillins Other (See Comments)    Pt states that it does not work for her.     Medications:  I have reviewed the patient's current medications. Prior to Admission:  Prescriptions Prior to Admission  Medication Sig Dispense Refill Last Dose  . amLODipine (NORVASC) 5 MG tablet Take 5 mg by mouth every morning.    06/22/2016 at 0730  . levothyroxine (SYNTHROID, LEVOTHROID) 75 MCG tablet Take 75 mcg by mouth daily before breakfast.   06/22/2016 at 0730   Scheduled: . amLODipine  5 mg Oral BH-q7a  . aspirin  300 mg Rectal Daily   Or  . aspirin  325 mg Oral Daily  . atorvastatin  20 mg Oral q1800  . enoxaparin (LOVENOX) injection  40 mg Subcutaneous Q24H  . [START ON 06/24/2016] Influenza vac split quadrivalent PF  0.5 mL Intramuscular Tomorrow-1000  . levothyroxine  75 mcg Oral QAC breakfast    ROS: History obtained from the patient  General ROS: negative for - chills, fatigue, fever, night sweats, weight gain or weight loss Psychological ROS: negative for - behavioral disorder, hallucinations, memory difficulties, mood swings or suicidal ideation Ophthalmic ROS: negative for - blurry vision, double vision, eye pain or loss of  vision ENT ROS: negative for - epistaxis, nasal discharge, oral lesions, sore throat, tinnitus or vertigo Allergy and Immunology ROS: negative for - hives or itchy/watery eyes Hematological and Lymphatic ROS: negative for - bleeding problems, bruising or swollen lymph nodes Endocrine ROS: negative for - galactorrhea, hair pattern changes, polydipsia/polyuria or temperature intolerance Respiratory ROS: negative for - cough, hemoptysis, shortness of breath or wheezing Cardiovascular ROS: negative for - chest pain, dyspnea on exertion, edema or irregular heartbeat Gastrointestinal ROS: negative for - abdominal pain, diarrhea, hematemesis, nausea/vomiting or stool incontinence Genito-Urinary ROS:  negative for - dysuria, hematuria, incontinence or urinary frequency/urgency Musculoskeletal ROS: negative for - joint swelling or muscular weakness Neurological ROS: as noted in HPI Dermatological ROS: negative for rash and skin lesion changes  Physical Examination: Blood pressure (!) 113/59, pulse 99, temperature 98 F (36.7 C), temperature source Oral, resp. rate 17, height 5\' 5"  (1.651 m), weight 67.9 kg (149 lb 12.8 oz), SpO2 98 %.  HEENT-  Normocephalic, no lesions, without obvious abnormality.  Normal external eye and conjunctiva.  Normal TM's bilaterally.  Normal auditory canals and external ears. Normal external nose, mucus membranes and septum.  Normal pharynx. Cardiovascular- S1, S2 normal, pulses palpable throughout   Lungs- chest clear, no wheezing, rales, normal symmetric air entry Abdomen- soft, non-tender; bowel sounds normal; no masses,  no organomegaly Extremities- no edema Lymph-no adenopathy palpable Musculoskeletal-no joint tenderness, deformity or swelling Skin-bruising on arms  Neurological Examination Mental Status: Alert, oriented, thought content appropriate, tangential.  Speech fluent without evidence of aphasia.  Able to follow 3 step commands without difficulty. Cranial Nerves: II: Discs flat bilaterally; Visual fields grossly normal, pupils equal, round, reactive to light and accommodation III,IV, VI: ptosis not present, extra-ocular motions intact bilaterally V,VII: decreased left NLF, facial light touch sensation decreased on the left VIII: hearing normal bilaterally IX,X: gag reflex present XI: bilateral shoulder shrug XII: midline tongue extension Motor: Right : Upper extremity   5/5    Left:     Upper extremity   4/5 with clawing of the left hand  Lower extremity   5/5     Lower extremity   4/5 Tone and bulk:normal tone throughout; no atrophy noted Sensory: Pinprick and light touch decreased in the LLE Deep Tendon Reflexes: 2+ in the upper  extremities and absent in the lower extremities Plantars: Right: mute   Left: mute Cerebellar: Normal finger-to-nose and normal heel-to-shin testing bilaterally Gait: not tested due to safety concerns   Laboratory Studies:  Basic Metabolic Panel:  Recent Labs Lab 06/23/16 0857 06/23/16 1731  NA 137  --   K 4.4  --   CL 108  --   CO2 23  --   GLUCOSE 101*  --   BUN 19  --   CREATININE 1.07*  --   CALCIUM 9.3  --   MG  --  2.1    Liver Function Tests:  Recent Labs Lab 06/23/16 0857  AST 20  ALT 7*  ALKPHOS 63  BILITOT 2.5*  PROT 6.8  ALBUMIN 4.3   No results for input(s): LIPASE, AMYLASE in the last 168 hours. No results for input(s): AMMONIA in the last 168 hours.  CBC:  Recent Labs Lab 06/23/16 0857  WBC 7.8  NEUTROABS 6.0  HGB 13.0  HCT 32.8*  MCV 101.9*  PLT 244    Cardiac Enzymes:  Recent Labs Lab 06/23/16 0857  TROPONINI <0.03    BNP: Invalid input(s): POCBNP  CBG: No results for input(s):  GLUCAP in the last 168 hours.  Microbiology: Results for orders placed or performed during the hospital encounter of 05/06/15  Urine culture     Status: None   Collection Time: 05/06/15  6:50 PM  Result Value Ref Range Status   Specimen Description URINE, RANDOM  Final   Special Requests NONE  Final   Culture   Final    >=100,000 COLONIES/mL ESCHERICHIA COLI Susceptibility Pattern Suggests Possibility of an Extended Spectrum Beta Lactamase Producer. Contact Laboratory Within 7 Days if Confirmation Warranted. CRITICAL RESULT CALLED TO, READ BACK BY AND VERIFIED WITH: GREG MOYER,RN 05/09/2015 AT 1012 BY JRS.    Report Status 05/09/2015 FINAL  Final   Organism ID, Bacteria ESCHERICHIA COLI  Final      Susceptibility   Escherichia coli - MIC*    AMPICILLIN >=32 RESISTANT Resistant     CEFTAZIDIME 16 RESISTANT Resistant     CEFAZOLIN >=64 RESISTANT Resistant     CEFTRIAXONE >=64 RESISTANT Resistant     CIPROFLOXACIN >=4 RESISTANT Resistant      GENTAMICIN <=1 SENSITIVE Sensitive     IMIPENEM <=0.25 SENSITIVE Sensitive     TRIMETH/SULFA <=20 SENSITIVE Sensitive     Extended ESBL POSITIVE Resistant     PIP/TAZO Value in next row Sensitive      SENSITIVE16    NITROFURANTOIN Value in next row Sensitive      SENSITIVE<=16    * >=100,000 COLONIES/mL ESCHERICHIA COLI    Coagulation Studies: No results for input(s): LABPROT, INR in the last 72 hours.  Urinalysis:  Recent Labs Lab 06/23/16 0857  COLORURINE YELLOW*  LABSPEC 1.012  PHURINE 6.0  GLUCOSEU NEGATIVE  HGBUR NEGATIVE  BILIRUBINUR NEGATIVE  KETONESUR NEGATIVE  PROTEINUR NEGATIVE  NITRITE NEGATIVE  LEUKOCYTESUR NEGATIVE    Lipid Panel:    Component Value Date/Time   CHOL 188 03/12/2012 0456   TRIG 145 03/12/2012 0456   HDL 41 03/12/2012 0456   VLDL 29 03/12/2012 0456   LDLCALC 118 (H) 03/12/2012 0456    HgbA1C:  Lab Results  Component Value Date   HGBA1C 3.9 (L) 03/11/2012    Urine Drug Screen:  No results found for: LABOPIA, COCAINSCRNUR, LABBENZ, AMPHETMU, THCU, LABBARB  Alcohol Level: No results for input(s): ETH in the last 168 hours.   Imaging: Dg Chest 2 View  Result Date: 06/23/2016 CLINICAL DATA:  Unresponsive EXAM: CHEST  2 VIEW COMPARISON:  02/16/2016 FINDINGS: Mild hyperinflation of the lungs. Heart is upper limits normal in size. Scattered aortic calcifications. Scarring in the apices. No confluent opacities or effusions. IMPRESSION: Mild hyperinflation/chronic changes.  No active disease. Electronically Signed   By: Rolm Baptise M.D.   On: 06/23/2016 09:54   Ct Head Wo Contrast  Result Date: 06/23/2016 CLINICAL DATA:  Altered mental status EXAM: CT HEAD WITHOUT CONTRAST TECHNIQUE: Contiguous axial images were obtained from the base of the skull through the vertex without intravenous contrast. COMPARISON:  05/21/2016 FINDINGS: Brain: There is atrophy and chronic small vessel disease changes. Old lacunar infarct in the right basal ganglia,  stable No acute intracranial abnormality. Specifically, no hemorrhage, hydrocephalus, mass lesion, acute infarction, or significant intracranial injury. Vascular: No hyperdense vessel or unexpected calcification. Skull: No acute calvarial abnormality. Sinuses/Orbits: Visualized paranasal sinuses and mastoids clear. Orbital soft tissues unremarkable. Other: None IMPRESSION: No acute intracranial abnormality. Atrophy, chronic microvascular disease. Old right basal ganglia lacunar infarct. Electronically Signed   By: Rolm Baptise M.D.   On: 06/23/2016 10:09   [name] [id number]  Contrast  Result Date: 06/23/2016 CLINICAL DATA:  Weakness and difficulty speaking.  Prior stroke. EXAM: MRI HEAD WITHOUT AND WITH CONTRAST TECHNIQUE: Multiplanar, multiecho pulse sequences of the brain and surrounding structures were obtained without and with intravenous contrast. CONTRAST:  64mL MULTIHANCE GADOBENATE DIMEGLUMINE 529 MG/ML IV SOLN COMPARISON:  Head CT 06/23/2016 and MRI 03/12/2012 FINDINGS: The study is mildly motion degraded. Brain: There is no evidence of acute infarct, intra-axial mass, midline shift, or extra-axial fluid collection. A chronic infarct is again seen in the right basal ganglia and corona radiata with associated chronic blood products. There is mild ex vacuo enlargement of the right lateral ventricle at this level. Mild to moderate global cerebral atrophy is again seen. There is a homogeneously enhancing extra-axial mass in the inferomedial left occipital region extending superiorly from the tentorium. This measures 2.1 x 1.6 x 1.8 cm and has enlarged from the prior MRI (previously 1.3 x 1.0 x 1.2 cm). There is mild mass effect on the overlying left occipital lobe with a small amount of adjacent white matter T2 hyperintensity which may reflect minimal edema, new from 2013. Vascular: Major intracranial vascular flow voids are preserved. Skull and upper cervical spine: Grossly unremarkable bone marrow signal.  Sinuses/Orbits: Unremarkable. Other: None. IMPRESSION: 1. No acute intracranial abnormality. 2. Chronic right basal ganglia infarct. 3. 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 Electronically Signed   By: Logan Bores M.D.   On: 06/23/2016 14:56   US Carotid Bilateral (at [facility] Only)  Result Date: 06/23/2016 CLINICAL DATA:  TIA symptoms EXAM: BILATERAL CAROTID DUPLEX ULTRASOUND TECHNIQUE: Pearline Cables scale imaging, color Doppler and duplex ultrasound were performed of bilateral carotid and vertebral arteries in the neck. COMPARISON:  None. FINDINGS: Criteria: Quantification of carotid stenosis is based on velocity parameters that correlate the residual internal carotid diameter with NASCET-based stenosis levels, using the diameter of the distal internal carotid lumen as the denominator for stenosis measurement. The following velocity measurements were obtained: RIGHT ICA:  94/23 cm/sec CCA:  0000000 cm/sec SYSTOLIC ICA/CCA RATIO:  1.5 DIASTOLIC ICA/CCA RATIO:  2.1 ECA:  59 cm/sec LEFT ICA:  120/15 cm/sec CCA:  123456 cm/sec SYSTOLIC ICA/CCA RATIO:  1.6 DIASTOLIC ICA/CCA RATIO:  1.2 ECA:  65 cm/sec RIGHT CAROTID ARTERY: Mild heterogeneous and echogenic shadowing plaque formation. No hemodynamically significant right ICA stenosis, velocity elevation, or turbulent flow. Degree of narrowing less than 50%. RIGHT VERTEBRAL ARTERY:  Antegrade LEFT CAROTID ARTERY: Mild heterogeneous and partially calcified echogenic plaque formation. No hemodynamically significant left ICA stenosis, velocity elevation, or turbulent flow. LEFT VERTEBRAL ARTERY:  Antegrade IMPRESSION: Mild bilateral carotid atherosclerosis. No hemodynamically significant ICA stenosis. Degree of narrowing less than 50% bilaterally. Patent antegrade vertebral flow bilaterally Electronically Signed   By: Jerilynn Mages.  Shick M.D.   On: 06/23/2016 15:31    Assessment: 82 y.o. female with history of  stroke presenting with an episode of unresponsiveness followed by difficulty with speech.  Patient now at baseline with left hemiparesis.  MRI of the brain personally reviewed and shows no acute changes.  Carotid dopplers show no evidence of hemodynamically significant stenosis.  Echocardiogram pending.  A1c pending, LDL pending.  Patient on no antiplatelet therapy at home.    Stroke Risk Factors - diabetes mellitus and hyperlipidemia  Plan: 1. HgbA1c, fasting lipid panel pending 2. PT consult, OT consult, Speech consult 3. Echocardiogram pending 4. Prophylactic therapy-Antiplatelet med: Aspirin - dose 325mg  daily 5. NPO until RN  stroke swallow screen 6. Telemetry monitoring 7. Frequent neuro checks 8. EEG.  May be performed as an outpatient   Alexis Goodell, MD Neurology [phone] 06/23/2016, 7:46 PM

## 2016-06-23 NOTE — ED Provider Notes (Signed)
[facility] Provider Note   CSN: [id number] Arrival date & time: 06/23/16  [id number]     History   Chief Complaint Chief Complaint  Patient presents with  . Altered Mental Status    HPI Vanessa [name] is a 82 y.o. female hx of DM, previous stroke with l sided weakness, colon cancer here with weakness, trouble speaking. Last normal was yesterday and 9 pm when she went to bed. Her husband lives with her and called EMS. She was brought here by EMS and had a normal blood sugar. States that her speech and weakness has been improving. EMS noticed strong urine smell. Had previous hx of stroke.   The history is provided by the patient.    Past Medical History:  Diagnosis Date  . Colon cancer ([place])   . Diabetes mellitus without complication ([place])   . Stroke ([place])   . Thyroid disorder     Patient Active Problem List   Diagnosis Date Noted  . Absolute anemia 05/30/2015  . B12 deficiency 05/30/2015  . Cerebral vascular accident ([place]) 05/30/2015  . Colon cancer ([place]) 05/30/2015  . Diabetes mellitus, type 2 ([place]) 05/30/2015  . HLD (hyperlipidemia) 05/30/2015  . Heart attack ([place]) 05/30/2015  . Adult hypothyroidism 05/30/2015  . Avitaminosis D 05/30/2015  . Enthesopathy of hip 01/05/2014  . Temporary cerebral vascular dysfunction 03/02/2012    Past Surgical History:  Procedure Laterality Date  . BACK SURGERY    . BREAST SURGERY    . COLON SURGERY    . TONSILLECTOMY      OB History    No data available       Home Medications    Prior to Admission medications   Medication Sig Start Date End Date Taking? Authorizing Provider  amLODipine (NORVASC) 5 MG tablet Take 5 mg by mouth every morning.     Historical Provider, MD  levothyroxine (SYNTHROID, LEVOTHROID) 75 MCG tablet Take 75 mcg by mouth daily before breakfast.    Historical Provider, MD  ondansetron (ZOFRAN ODT) 4 MG disintegrating tablet Take 1 tablet (4 mg total) by mouth every 8 (eight) hours as needed  for nausea or vomiting. 11/14/15   Carrie Mew, MD    Family History No family history on file.  Social History Social History  Substance Use Topics  . Smoking status: Never Smoker  . Smokeless tobacco: Never Used  . Alcohol use No     Allergies   Benadryl [diphenhydramine hcl]; Codeine; Oxycodone; and Penicillins   Review of Systems Review of Systems  Neurological: Positive for speech difficulty and weakness.  All other systems reviewed and are negative.    Physical Exam Updated Vital Signs BP 122/64 (BP Location: Right Arm)   Pulse 73   Temp 97.5 F (36.4 C) (Oral)   Resp 16   Ht 5\' 6"  (1.676 m)   Wt 143 lb (64.9 kg)   SpO2 100%   BMI 23.08 kg/m   Physical Exam  Constitutional: She is oriented to person, place, and time.  Chronically ill, NAD   HENT:  Head: Normocephalic.  Eyes: EOM are normal. Pupils are equal, round, and reactive to light.  Neck: Normal range of motion. Neck supple.  Cardiovascular: Normal rate, regular rhythm and normal heart sounds.   Pulmonary/Chest: Effort normal and breath sounds normal. No respiratory distress. She has no wheezes. She has no rales.  Abdominal: Soft. Bowel sounds are normal. She exhibits no distension. There is no tenderness. There is no guarding.  Musculoskeletal:  Normal range of motion.  Neurological: She is alert and oriented to person, place, and time.  CN 2-12 intact. Nl speech. Strength 4/5 L side, 5/5 R side.   Skin: Skin is warm.  Psychiatric: She has a normal mood and affect.  Nursing note and vitals reviewed.    ED Treatments / Results  Labs (all labs ordered are listed, but only abnormal results are displayed) Labs Reviewed  COMPREHENSIVE METABOLIC PANEL - Abnormal; Notable for the following:       Result Value   Glucose, Bld 101 (*)    Creatinine, Ser 1.07 (*)    ALT 7 (*)    Total Bilirubin 2.5 (*)    GFR calc non Af Amer 47 (*)    GFR calc Af Amer 54 (*)    All other components within  normal limits  URINALYSIS COMPLETEWITH MICROSCOPIC ([facility] ONLY) - Abnormal; Notable for the following:    Color, Urine YELLOW (*)    APPearance CLEAR (*)    Squamous Epithelial / LPF 0-5 (*)    All other components within normal limits  URINE CULTURE  TROPONIN I  CBC WITH DIFFERENTIAL/PLATELET    EKG  EKG Interpretation None      ED ECG REPORT I, Wandra Arthurs, the attending physician, personally viewed and interpreted this ECG.   Date: 06/23/2016  EKG Time: 8:44 am  Rate: 73  Rhythm: normal EKG, normal sinus rhythm  Axis: normal  Intervals:none  ST&T Change: nonspecific   Radiology Dg Chest 2 View  Result Date: 06/23/2016 CLINICAL DATA:  Unresponsive EXAM: CHEST  2 VIEW COMPARISON:  02/16/2016 FINDINGS: Mild hyperinflation of the lungs. Heart is upper limits normal in size. Scattered aortic calcifications. Scarring in the apices. No confluent opacities or effusions. IMPRESSION: Mild hyperinflation/chronic changes.  No active disease. Electronically Signed   By: Rolm Baptise M.D.   On: 06/23/2016 09:54   Ct Head Wo Contrast  Result Date: 06/23/2016 CLINICAL DATA:  Altered mental status EXAM: CT HEAD WITHOUT CONTRAST TECHNIQUE: Contiguous axial images were obtained from the base of the skull through the vertex without intravenous contrast. COMPARISON:  05/21/2016 FINDINGS: Brain: There is atrophy and chronic small vessel disease changes. Old lacunar infarct in the right basal ganglia, stable No acute intracranial abnormality. Specifically, no hemorrhage, hydrocephalus, mass lesion, acute infarction, or significant intracranial injury. Vascular: No hyperdense vessel or unexpected calcification. Skull: No acute calvarial abnormality. Sinuses/Orbits: Visualized paranasal sinuses and mastoids clear. Orbital soft tissues unremarkable. Other: None IMPRESSION: No acute intracranial abnormality. Atrophy, chronic microvascular disease. Old right basal ganglia lacunar infarct. Electronically  Signed   By: Rolm Baptise M.D.   On: 06/23/2016 10:09    Procedures Procedures (including critical care time)     Medications Ordered in ED Medications - No data to display   Initial Impression / Assessment and Plan / ED Course  I have reviewed the triage vital signs and the nursing notes.  Pertinent labs & imaging results that were available during my care of the patient were reviewed by me and considered in my medical decision making (see chart for details).  Clinical Course   Vanessa [name] is a 82 y.o. female here with weakness, slurred speech. Symptoms improving in the ED. Patient out of the window for TPA. Will get CT head, labs, UA, CXR. Will consult neuro.   10:50 AM Dr. Doy Mince called. CT head showed chronic changes. Dr. Doy Mince recommend inpatient workup for TIA. Hospitalist to admit   Final Clinical  Impressions(s) / ED Diagnoses   Final diagnoses:  None    New Prescriptions New Prescriptions   No medications on file     Drenda Freeze, MD 06/23/16 1051

## 2016-06-23 NOTE — ED Notes (Signed)
IV attempts x2 unsuccessful by Levada Dy, RN

## 2016-06-23 NOTE — ED Triage Notes (Signed)
Patient brought in by [facility] from home, per EMS they were called out for unresponsive person, when they arrived patient was awake in bed, patient was aphasic, and was not able to grip hands. Per EMS patient also has strong odor of urine. Patient has a hx/o CVA in the past with left sided deficit. Patient is currently alert and in no acute distress.

## 2016-06-23 NOTE — Progress Notes (Signed)
PT Cancellation Note  Patient Details Name: Vanessa [name] MRN: [id number] DOB: 14-May-1931   Cancelled Treatment:    Reason Eval/Treat Not Completed: Patient at procedure or test/unavailable. Consult received and chart reviewed. Pt out of room for imaging at this time. Will re-attempt next date.   Annalena Piatt 06/23/2016, 1:58 PM  Greggory Stallion, PT, DPT [phone]

## 2016-06-23 NOTE — H&P (Addendum)
[facility] NAME: Vanessa [name]    MR#:  [id number]  DATE OF BIRTH:  [birth year]  DATE OF ADMISSION:  06/23/2016  PRIMARY CARE PHYSICIAN: Sabino Snipes KEY, MD   REQUESTING/REFERRING PHYSICIAN: Dr Mathis Bud  CHIEF COMPLAINT:    trouble speaking/ unresponsive HISTORY OF PRESENT ILLNESS:  Vanessa [name]  is a 82 y.o. female with a known history of CVA and hypothyroidism who arrived via EMS for unresponsiveness and aphasia. Patient reports that her husband tried to wake her up around 7:00 this morning that she was unresponsive. He then proceeded to call EMS. When EMS arrived apparently she was awake however she could not get any have her words out. By the time she arrived to the emergency room she was able to speak. She has baseline left-sided weakness from her previous stroke which she reports has not changed to her knowledge. She also says on Wednesday she was walking to her car using her walker when all of a sudden she was very weak and she was asking for help to get into her car. By the time she arrived at home she was back to her baseline. She denies headaches or visual changes. Of note Her husband had driven a car home that day.  PAST MEDICAL HISTORY:   Past Medical History:  Diagnosis Date  . Colon cancer ([place])   . Diabetes mellitus without complication ([place])   . Stroke ([place])   . Thyroid disorder     PAST SURGICAL HISTORY:   Past Surgical History:  Procedure Laterality Date  . BACK SURGERY    . BREAST SURGERY    . COLON SURGERY    . TONSILLECTOMY      SOCIAL HISTORY:   Social History  Substance Use Topics  . Smoking status: Never Smoker  . Smokeless tobacco: Never Used  . Alcohol use No    FAMILY HISTORY:  DM, CVA Breast cancer  DRUG ALLERGIES:   Allergies  Allergen Reactions  . Benadryl [Diphenhydramine Hcl] Other (See Comments)    Pt states that it makes her legs jump.   . Codeine Nausea And Vomiting  . Oxycodone  Nausea And Vomiting  . Penicillins Other (See Comments)    Pt states that it does not work for her.     REVIEW OF SYSTEMS:   Review of Systems  Constitutional: Negative.  Negative for chills, fever and malaise/fatigue.  HENT: Negative.  Negative for ear discharge, ear pain, hearing loss, nosebleeds and sore throat.   Eyes: Negative.  Negative for blurred vision and pain.  Respiratory: Negative.  Negative for cough, hemoptysis, shortness of breath and wheezing.   Cardiovascular: Negative.  Negative for chest pain, palpitations and leg swelling.  Gastrointestinal: Negative.  Negative for abdominal pain, blood in stool, diarrhea, nausea and vomiting.  Genitourinary: Negative.  Negative for dysuria.  Musculoskeletal: Negative.  Negative for back pain.  Skin: Negative.   Neurological: Positive for speech change and focal weakness. Negative for dizziness, tremors, seizures and headaches.  Endo/Heme/Allergies: Negative.  Does not bruise/bleed easily.  Psychiatric/Behavioral: Negative.  Negative for depression, hallucinations and suicidal ideas.    MEDICATIONS AT HOME:   Prior to Admission medications   Medication Sig Start Date End Date Taking? Authorizing Provider  amLODipine (NORVASC) 5 MG tablet Take 5 mg by mouth every morning.     Historical Provider, MD  levothyroxine (SYNTHROID, LEVOTHROID) 75 MCG tablet Take 75 mcg by mouth daily before breakfast.  Historical Provider, MD  ondansetron (ZOFRAN ODT) 4 MG disintegrating tablet Take 1 tablet (4 mg total) by mouth every 8 (eight) hours as needed for nausea or vomiting. 11/14/15   Carrie Mew, MD      VITAL SIGNS:  Blood pressure 122/64, pulse 73, temperature 97.5 F (36.4 C), temperature source Oral, resp. rate 16, height 5\' 6"  (1.676 m), weight 64.9 kg (143 lb), SpO2 100 %.  PHYSICAL EXAMINATION:   Physical Exam  Constitutional: She is oriented to person, place, and time and well-developed, well-nourished, and in no  distress. No distress.  HENT:  Head: Normocephalic.  Eyes: No scleral icterus.  Neck: Normal range of motion. Neck supple. No JVD present. No tracheal deviation present.  Cardiovascular: Normal rate and regular rhythm.  Exam reveals no gallop and no friction rub.   Murmur heard. Pulmonary/Chest: Effort normal and breath sounds normal. No respiratory distress. She has no wheezes. She has no rales. She exhibits no tenderness.  Abdominal: Soft. Bowel sounds are normal. She exhibits no distension and no mass. There is no tenderness. There is no rebound and no guarding.  Musculoskeletal: Normal range of motion. She exhibits no edema.  LUE 4/5 She cannot move her left arm past 90 which is at her baseline, passively it posterior range of 130 Left claw hand from previous stroke Left lower extremity 4-5 strength  Neurological: She is alert and oriented to person, place, and time.  Skin: Skin is warm. No rash noted. No erythema.  Psychiatric: Affect and judgment normal.      LABORATORY PANEL:   CBC  Recent Labs Lab 06/23/16 0857  WBC 7.8  HGB 13.0  HCT 32.8*  PLT 244   ------------------------------------------------------------------------------------------------------------------  Chemistries   Recent Labs Lab 06/23/16 0857  NA 137  K 4.4  CL 108  CO2 23  GLUCOSE 101*  BUN 19  CREATININE 1.07*  CALCIUM 9.3  AST 20  ALT 7*  ALKPHOS 63  BILITOT 2.5*   ------------------------------------------------------------------------------------------------------------------  Cardiac Enzymes  Recent Labs Lab 06/23/16 0857  TROPONINI <0.03   ------------------------------------------------------------------------------------------------------------------  RADIOLOGY:  Dg Chest 2 View  Result Date: 06/23/2016 CLINICAL DATA:  Unresponsive EXAM: CHEST  2 VIEW COMPARISON:  02/16/2016 FINDINGS: Mild hyperinflation of the lungs. Heart is upper limits normal in size. Scattered  aortic calcifications. Scarring in the apices. No confluent opacities or effusions. IMPRESSION: Mild hyperinflation/chronic changes.  No active disease. Electronically Signed   By: Rolm Baptise M.D.   On: 06/23/2016 09:54   Ct Head Wo Contrast  Result Date: 06/23/2016 CLINICAL DATA:  Altered mental status EXAM: CT HEAD WITHOUT CONTRAST TECHNIQUE: Contiguous axial images were obtained from the base of the skull through the vertex without intravenous contrast. COMPARISON:  05/21/2016 FINDINGS: Brain: There is atrophy and chronic small vessel disease changes. Old lacunar infarct in the right basal ganglia, stable No acute intracranial abnormality. Specifically, no hemorrhage, hydrocephalus, mass lesion, acute infarction, or significant intracranial injury. Vascular: No hyperdense vessel or unexpected calcification. Skull: No acute calvarial abnormality. Sinuses/Orbits: Visualized paranasal sinuses and mastoids clear. Orbital soft tissues unremarkable. Other: None IMPRESSION: No acute intracranial abnormality. Atrophy, chronic microvascular disease. Old right basal ganglia lacunar infarct. Electronically Signed   By: Rolm Baptise M.D.   On: 06/23/2016 10:09    EKG:  NSR no ST elevation Incomplete right bundle branch block IMPRESSION AND PLAN:   82 year old female with a history of previous CVA not on aspirin or statin medications with residual left-sided weakness who presents with aphasia.  1.Aphasia: Patient symptoms mimic TIA. Patient symptoms have now resolved. Order MRI, carotid Doppler, echo. Neuro consult was called by the ER physician Start aspirin and statin therapy. Side effects, alternatives, risks and benefits were discussed with patient and patient's daughter at bedside. They accept these risks and would like to start on aspirin and statin. Lipid panel and hemoglobin A1c. PT, OT and speech consult. Telemetry monitor  2. Hypothyroid: Continue Synthroid  3. Essential hypertension:  Symptoms of aphasia have resolved and therefore will continue Norvasc.   All the records are reviewed and case discussed with ED provider. Management plans discussed with the patient and she in agreement  CODE STATUS: DNR TOTAL TIME TAKING CARE OF THIS PATIENT: 50 minutes.    Herman Fiero M.D on 06/23/2016 at 10:53 AM  Between 7am to 6pm - Pager - [phone]  After 6pm go to [email or web address] - password [email or web address]  [facility]  Office  [phone]  CC: Primary care physician; Sabino Snipes KEY, MD

## 2016-06-23 NOTE — Care Management Obs Status (Signed)
MEDICARE OBSERVATION STATUS NOTIFICATION   Patient Details  Name: Vanessa [name] MRN: [id number] Date of Birth: January 28, 1931   Medicare Observation Status Notification Given:   yes    Beau Fanny, RN 06/23/2016, 11:19 AM

## 2016-06-23 NOTE — Evaluation (Addendum)
Clinical/Bedside Swallow Evaluation Patient Details  Name: Vanessa [name] MRN: [id number] Date of Birth: 1931-04-17  Today's Date: 06/23/2016 Time: SLP Start Time (ACUTE ONLY): 1500 SLP Stop Time (ACUTE ONLY): 1600 SLP Time Calculation (min) (ACUTE ONLY): 60 min  Past Medical History:  Past Medical History:  Diagnosis Date  . Colon cancer ([place])   . Diabetes mellitus without complication ([place])   . Stroke ([place])   . Thyroid disorder    Past Surgical History:  Past Surgical History:  Procedure Laterality Date  . BACK SURGERY    . BREAST SURGERY    . COLON SURGERY    . TONSILLECTOMY     HPI:  Pt is a 82 y.o. female with a known history of CVA, DM, and hypothyroidism who arrived via EMS for unresponsiveness and difficulty talking. Patient reports that her husband tried to wake her up around 7:00 this morning that she was unresponsive. He then proceeded to call EMS. When EMS arrived apparently she was awake however she could not get any have her words out. By the time she arrived to the emergency room she was able to speak. She has baseline left-sided weakness from her previous stroke which she reports has not changed to her knowledge. She also says on Wednesday she was walking to her car using her walker when all of a sudden she was very weak and she had difficulty getting in the car but soon it resolved. Currently, pt is A/O x3. She is conversive at conversational level; following commands accurately; she answered all questions accurately. Pt denied any trouble swallowing.    Assessment / Plan / Recommendation Clinical Impression  Pt appeared to adequately tolerate trials of thin liquids and purees/soft solids w/ no overt s/s of aspiration noted; no decline in vocal quality or respiratory status. Oral phase wfl for bolus management and clearing despite edentulous status. Pt did state she does not eat much meat d/t toughness of it. Pt appears at reduced risk for aspiration following general  aspiration precautions. Recommend a soft foods diet w/ thin liquids; general aspiration precautions; meds in puree if easier for swallowing. No further skilled ST Services indicated at this time; NSG to reconsult if any change in status while admitted.   Pt stated her speech and language abilities are at her baseline w/ no new deficits noted. Pt converses at conversational level.    Aspiration Risk   (reduced)    Diet Recommendation  Mech Soft type foods; Thin liquids; general aspiration precautions. Setup at meals d/t LUE weakness (baseline).  Medication Administration: Whole meds with liquid (or in Puree if easier for swallowing(larger pills))    Other  Recommendations Recommended Consults:  (none) Oral Care Recommendations: Oral care BID;Staff/trained caregiver to provide oral care   Follow up Recommendations None      Frequency and Duration            Prognosis Prognosis for Safe Diet Advancement: Good      Swallow Study   General Date of Onset: 06/23/16 HPI: Pt is a 82 y.o. female with a known history of CVA, DM, and hypothyroidism who arrived via EMS for unresponsiveness and difficulty talking. Patient reports that her husband tried to wake her up around 7:00 this morning that she was unresponsive. He then proceeded to call EMS. When EMS arrived apparently she was awake however she could not get any have her words out. By the time she arrived to the emergency room she was able to speak. She has  baseline left-sided weakness from her previous stroke which she reports has not changed to her knowledge. She also says on Wednesday she was walking to her car using her walker when all of a sudden she was very weak and she had difficulty getting in the car but soon it resolved. Currently, pt is A/O x3. She is conversive at conversational level; following commands accurately; she answered all questions accurately. Pt denied any trouble swallowing.  Type of Study: Bedside Swallow  Evaluation Previous Swallow Assessment: during previous admission for CVA for dysphagia and Cognition Diet Prior to this Study: Dysphagia 3 (soft);Thin liquids Temperature Spikes Noted: No (wbc not elevated) Respiratory Status: Room air History of Recent Intubation: No Behavior/Cognition: Alert;Cooperative;Pleasant mood Oral Cavity Assessment: Within Functional Limits Oral Care Completed by SLP: Recent completion by staff Oral Cavity - Dentition: Edentulous Vision: Functional for self-feeding Self-Feeding Abilities: Able to feed self;Needs set up (LUE weakness baseline) Patient Positioning: Upright in bed Baseline Vocal Quality: Normal Volitional Cough: Strong Volitional Swallow: Able to elicit    Oral/Motor/Sensory Function Overall Oral Motor/Sensory Function: Within functional limits   Ice Chips Ice chips: Not tested   Thin Liquid Thin Liquid: Within functional limits Presentation: Straw;Self Fed (~3-4 ozs)    Nectar Thick Nectar Thick Liquid: Not tested   Honey Thick Honey Thick Liquid: Not tested   Puree Puree: Within functional limits Presentation: Self Fed;Spoon (4-5 trials)   Solid   GO   Solid: Within functional limits (broken down and moistened at times) Presentation: Self Fed;Spoon (8 trials)    Functional Assessment Tool Used: clinical judgement Functional Limitations: Swallowing Swallow Current Status [id number]): At least 1 percent but less than 20 percent impaired, limited or restricted Swallow Goal Status [phone]): At least 1 percent but less than 20 percent impaired, limited or restricted Swallow Discharge Status [phone]): At least 1 percent but less than 20 percent impaired, limited or restricted    Orinda Kenner, MS, CCC-SLP  Brittlyn Cloe 06/23/2016,4:09 PM

## 2016-06-23 NOTE — ED Notes (Signed)
IV attempt x 1 by Santiago Glad, RN unsucessful

## 2016-06-24 ENCOUNTER — Observation Stay
Admit: 2016-06-24 | Discharge: 2016-06-24 | Disposition: A | Discharge status: Home / Self Care | Payer: Medicare Other | Attending: Internal Medicine | Admitting: Internal Medicine

## 2016-06-24 DIAGNOSIS — G459 Transient cerebral ischemic attack, unspecified: Secondary | ICD-10-CM | POA: Diagnosis not present

## 2016-06-24 LAB — LIPID PANEL
CHOLESTEROL: 199 mg/dL (ref 0–200)
HDL: 51 mg/dL (ref >40)
LDL Cholesterol: 131 mg/dL — ABNORMAL HIGH (ref 0–99)
TRIGLYCERIDES: 85 mg/dL (ref <150)
Total CHOL/HDL Ratio: 3.9 RATIO
VLDL: 17 mg/dL (ref 0–40)

## 2016-06-24 LAB — URINE CULTURE: Culture: NO GROWTH

## 2016-06-24 MED ORDER — LEVETIRACETAM 500 MG PO TABS: 500.0000 mg | ORAL_TABLET | Freq: Two times a day (BID) | ORAL | 2 refills | Status: AC

## 2016-06-24 MED ORDER — ASPIRIN 325 MG PO TABS: 325.0000 mg | ORAL_TABLET | Freq: Every day | ORAL | Status: AC

## 2016-06-24 MED ORDER — LEVETIRACETAM 500 MG PO TABS
500.0000 mg | ORAL_TABLET | Freq: Two times a day (BID) | ORAL | Status: DC
  Administered 2016-06-24: 13:00:00 500 mg via ORAL
  Filled 2016-06-24: qty 1

## 2016-06-24 MED ORDER — ATORVASTATIN CALCIUM 20 MG PO TABS: 20.0000 mg | ORAL_TABLET | Freq: Every day | ORAL | 0 refills | Status: DC

## 2016-06-24 NOTE — Progress Notes (Signed)
MD order received in [facility] to discharge pt home today; verbally reviewed AVS with pt including medications/gave Rxs for Aspirin, Lipitor and Keppra to pt, also gave pt coupon for Keppra to get filled at [facility]; pt to call on 06/26/16 to schedule follow-up appointments with Dr Gwynneth Aliment for 7 days and with Dr Manuella Ghazi for 2 weeks; pt verbalized understanding with no questions at this time; pt's discharge pending arrival of her daughter for a ride home

## 2016-06-24 NOTE — Progress Notes (Signed)
PT Cancellation Note  Patient Details Name: Vanessa [name] MRN: [id number] DOB: 17-Jan-1931   Cancelled Treatment:    Reason Eval/Treat Not Completed: Patient declined, no reason specified; Pt reports she just found out from the doctor that she has a brain tumor and does not want to participate with PT.  Explained the purpose of the PT visit and pt continued to politely decline.  MD notified.  See OT notes for current functional mobility and recs.  Khoen Genet A Alexi Dorminey, PT 06/24/2016, 11:43 AM

## 2016-06-24 NOTE — Progress Notes (Signed)
Pt's daughter present for pt discharge; pt discharged via wheelchair by nursing to the visitor's entrance
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
## 2016-06-24 NOTE — Consult Note (Signed)
[facility] Cardiology Consultation Note  Patient ID: Vanessa [name], MRN: [id number], DOB/AGE: 1931/03/13 82 y.o. Admit date: 06/23/2016   Date of Consult: 06/24/2016 Primary Physician: Sabino Snipes KEY, MD Primary Cardiologist: None Chief Complaint:  Chief Complaint  Patient presents with  . Altered Mental Status   Reason for Consult:Recurrent stroke with apparent SVT  HPI: 82 y.o. female  with known peripheral vascular disease status post previous stroke as well as diabetes with complication and essential hypertension with mixed hyperlipidemia. The patient has had baseline stroke with left-sided weakness but was unresponsive the and was taken to the emergency room. At that time the patient did awaken and have some more conversation and had improvements of symptoms and concerning for TIA or extension of stroke. The patient was placed on appropriate medications from previous stroke with vascular disease including metoprolol and amlodipine for hypertension control which were stable as well as atorvastatin for high intensity cholesterol therapy. The patient also was on an aspirin apparently of for which was taken intermittently. The patient now has further concerns of strokelike issues and will need further workup and assessment. Differential diagnosis includes atrial fibrillation L and apparently there was some telemetry changes showing supraventricular tachycardia although currently not able to document  Past Medical History:  Diagnosis Date  . Colon cancer ([place])   . Diabetes mellitus without complication ([place])   . Stroke ([place])   . Thyroid disorder       Surgical History:  Past Surgical History:  Procedure Laterality Date  . BACK SURGERY    . BREAST SURGERY    . COLON SURGERY    . TONSILLECTOMY       Home Meds: Prior to Admission medications   Medication Sig Start Date End Date Taking? Authorizing Provider  amLODipine (NORVASC) 5 MG tablet Take 5 mg by mouth every morning.     Yes Historical Provider, MD  levothyroxine (SYNTHROID, LEVOTHROID) 75 MCG tablet Take 75 mcg by mouth daily before breakfast.   Yes Historical Provider, MD    Inpatient Medications:  . amLODipine  5 mg Oral BH-q7a  . aspirin  300 mg Rectal Daily   Or  . aspirin  325 mg Oral Daily  . atorvastatin  20 mg Oral q1800  . enoxaparin (LOVENOX) injection  40 mg Subcutaneous Q24H  . levothyroxine  75 mcg Oral QAC breakfast      Allergies:  Allergies  Allergen Reactions  . Benadryl [Diphenhydramine Hcl] Other (See Comments)    Pt states that it makes her legs jump.   . Codeine Nausea And Vomiting  . Oxycodone Nausea And Vomiting and Other (See Comments)    "Makes me real irritable and I feel terrible."  . Penicillins Other (See Comments)    Pt states that it does not work for her.     Social History   Social History  . Marital status: Married    Spouse name: N/A  . Number of children: N/A  . Years of education: N/A   Occupational History  . Not on file.   Social History Main Topics  . Smoking status: Never Smoker  . Smokeless tobacco: Never Used  . Alcohol use No  . Drug use: No  . Sexual activity: Not on file   Other Topics Concern  . Not on file   Social History Narrative  . No narrative on file     No family history on file.   Review of Systems Positive foStrokelike symptoms Negative for: General:  chills, fever, night sweats or weight changes.  Cardiovascular: PND orthopnea syncope dizziness  Dermatological skin lesions rashes Respiratory: Cough congestion Urologic: Frequent urination urination at night and hematuria Abdominal: negative for nausea, vomiting, diarrhea, bright red blood per rectum, melena, or hematemesis Neurologic: negative for visual changes, and/or hearing changes  All other systems reviewed and are otherwise negative except as noted above.  Labs:  Recent Labs  06/23/16 0857  TROPONINI <0.03   Lab Results  Component Value Date    WBC 7.8 06/23/2016   HGB 13.0 06/23/2016   HCT 32.8 (L) 06/23/2016   MCV 101.9 (H) 06/23/2016   PLT 244 06/23/2016    Recent Labs Lab 06/23/16 0857  NA 137  K 4.4  CL 108  CO2 23  BUN 19  CREATININE 1.07*  CALCIUM 9.3  PROT 6.8  BILITOT 2.5*  ALKPHOS 63  ALT 7*  AST 20  GLUCOSE 101*   Lab Results  Component Value Date   CHOL 199 06/24/2016   HDL 51 06/24/2016   LDLCALC 131 (H) 06/24/2016   TRIG 85 06/24/2016   No results found for: DDIMER  Radiology/Studies:  Dg Chest 2 View  Result Date: 06/23/2016 CLINICAL DATA:  Unresponsive EXAM: CHEST  2 VIEW COMPARISON:  02/16/2016 FINDINGS: Mild hyperinflation of the lungs. Heart is upper limits normal in size. Scattered aortic calcifications. Scarring in the apices. No confluent opacities or effusions. IMPRESSION: Mild hyperinflation/chronic changes.  No active disease. Electronically Signed   By: Rolm Baptise M.D.   On: 06/23/2016 09:54   Ct Head Wo Contrast  Result Date: 06/23/2016 CLINICAL DATA:  Altered mental status EXAM: CT HEAD WITHOUT CONTRAST TECHNIQUE: Contiguous axial images were obtained from the base of the skull through the vertex without intravenous contrast. COMPARISON:  05/21/2016 FINDINGS: Brain: There is atrophy and chronic small vessel disease changes. Old lacunar infarct in the right basal ganglia, stable No acute intracranial abnormality. Specifically, no hemorrhage, hydrocephalus, mass lesion, acute infarction, or significant intracranial injury. Vascular: No hyperdense vessel or unexpected calcification. Skull: No acute calvarial abnormality. Sinuses/Orbits: Visualized paranasal sinuses and mastoids clear. Orbital soft tissues unremarkable. Other: None IMPRESSION: No acute intracranial abnormality. Atrophy, chronic microvascular disease. Old right basal ganglia lacunar infarct. Electronically Signed   By: Rolm Baptise M.D.   On: 06/23/2016 10:09   [name] [id number] Contrast  Result Date: 06/23/2016 CLINICAL DATA:   Weakness and difficulty speaking.  Prior stroke. EXAM: MRI HEAD WITHOUT AND WITH CONTRAST TECHNIQUE: Multiplanar, multiecho pulse sequences of the brain and surrounding structures were obtained without and with intravenous contrast. CONTRAST:  37mL MULTIHANCE GADOBENATE DIMEGLUMINE 529 MG/ML IV SOLN COMPARISON:  Head CT 06/23/2016 and MRI 03/12/2012 FINDINGS: The study is mildly motion degraded. Brain: There is no evidence of acute infarct, intra-axial mass, midline shift, or extra-axial fluid collection. A chronic infarct is again seen in the right basal ganglia and corona radiata with associated chronic blood products. There is mild ex vacuo enlargement of the right lateral ventricle at this level. Mild to moderate global cerebral atrophy is again seen. There is a homogeneously enhancing extra-axial mass in the inferomedial left occipital region extending superiorly from the tentorium. This measures 2.1 x 1.6 x 1.8 cm and has enlarged from the prior MRI (previously 1.3 x 1.0 x 1.2 cm). There is mild mass effect on the overlying left occipital lobe with a small amount of adjacent white matter T2 hyperintensity which may reflect minimal edema, new from 2013. Vascular: Major intracranial vascular flow  voids are preserved. Skull and upper cervical spine: Grossly unremarkable bone marrow signal. Sinuses/Orbits: Unremarkable. Other: None. IMPRESSION: 1. No acute intracranial abnormality. 2. Chronic right basal ganglia infarct. 3. 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 Electronically Signed   By: Logan Bores M.D.   On: 06/23/2016 14:56   US Carotid Bilateral (at [facility] And [facility] Only)  Result Date: 06/23/2016 CLINICAL DATA:  TIA symptoms EXAM: BILATERAL CAROTID DUPLEX ULTRASOUND TECHNIQUE: Pearline Cables scale imaging, color Doppler and duplex ultrasound were performed of bilateral carotid and vertebral arteries in the neck. COMPARISON:  None. FINDINGS:  Criteria: Quantification of carotid stenosis is based on velocity parameters that correlate the residual internal carotid diameter with NASCET-based stenosis levels, using the diameter of the distal internal carotid lumen as the denominator for stenosis measurement. The following velocity measurements were obtained: RIGHT ICA:  94/23 cm/sec CCA:  0000000 cm/sec SYSTOLIC ICA/CCA RATIO:  1.5 DIASTOLIC ICA/CCA RATIO:  2.1 ECA:  59 cm/sec LEFT ICA:  120/15 cm/sec CCA:  123456 cm/sec SYSTOLIC ICA/CCA RATIO:  1.6 DIASTOLIC ICA/CCA RATIO:  1.2 ECA:  65 cm/sec RIGHT CAROTID ARTERY: Mild heterogeneous and echogenic shadowing plaque formation. No hemodynamically significant right ICA stenosis, velocity elevation, or turbulent flow. Degree of narrowing less than 50%. RIGHT VERTEBRAL ARTERY:  Antegrade LEFT CAROTID ARTERY: Mild heterogeneous and partially calcified echogenic plaque formation. No hemodynamically significant left ICA stenosis, velocity elevation, or turbulent flow. LEFT VERTEBRAL ARTERY:  Antegrade IMPRESSION: Mild bilateral carotid atherosclerosis. No hemodynamically significant ICA stenosis. Degree of narrowing less than 50% bilaterally. Patent antegrade vertebral flow bilaterally Electronically Signed   By: Jerilynn Mages.  Shick M.D.   On: 06/23/2016 15:31    EKG:  normal sinus rhythm with nonspecific ST changes  Weights: Filed Weights   06/23/16 0845 06/23/16 1206  Weight: 143 lb (64.9 kg) 149 lb 12.8 oz (67.9 kg)     Physical Exam: Blood pressure (!) 115/46, pulse 81, temperature 97.9 F (36.6 C), temperature source Oral, resp. rate 20, height 5\' 5"  (1.651 m), weight 149 lb 12.8 oz (67.9 kg), SpO2 100 %. Body mass index is 24.93 kg/m. General: Well developed, well nourished, in no acute distress. Head eyes ears nose throat: Normocephalic, atraumatic, sclera non-icteric, no xanthomas, nares are without discharge. No apparent thyromegaly and/or mass  Lungs: Normal respiratory effort.  no wheezes, no rales,  no rhonchi.  Heart: RRR with normal S1 S2. no murmur gallop, no rub, PMI is normal size and placement, carotid upstroke normal without bruit, jugular venous pressure is normal Abdomen: Soft, non-tender, non-distended with normoactive bowel sounds. No hepatomegaly. No rebound/guarding. No obvious abdominal masses. Abdominal aorta is normal size without bruit Extremities: No edema. no cyanosis, no clubbing, no ulcers  Peripheral : 2+ bilateral upper extremity pulses, 2+ bilateral femoral pulses, 2+ bilateral dorsal pedal pulse Neuro:  notAlert and oriented. No facial asymmetry.  some  focal deficit. Moves all extremities spontaneously. Musculoskeletal: Normal muscle tone with  kyphosis      Assessment: 82 year old female with diabetes with complications essential hypertension mixed hyperlipidemia with previous stroke having an episode of unresponsiveness possibly secondary to TIA and/or stroke requiring further workup and assessment and treatment  Plan:  1. Continue telemetry to assess for possible rhythm disturbances including atrial fibrillation 2. Continue metoprolol for heart rate and blood pressure control 3. High intensity cholesterol therapy with atorvastatin 4. Repeat echocardiogram for LV systolic dysfunction valvular heart disease contributing to above and consider transesophageal echocardiogram for  secondary causes of stroke and/or TIA 5. Further possibility of monitor and/or longer assessment of the possibility of atrial fibrillation if the cryptogenic stroke is more likely 6. No restrictions to further activity and/or rehabilitation at this time 7. Further anticoagulation if patient has higher potential risk that this is atrial fibrillation with embolic phenomenon  Signed, Corey Skains M.D. [facility] 06/24/2016, 10:00 AM
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
## 2016-06-24 NOTE — Consult Note (Signed)
Referring Physician: Mody    Chief Complaint: Episode of unresponsiveness  Mental status improved and pt is following commands.   MRI resulted and pts strength is back to baseline.    Past Medical History:  Diagnosis Date  . Colon cancer ([place])   . Diabetes mellitus without complication ([place])   . Stroke ([place])   . Thyroid disorder     Past Surgical History:  Procedure Laterality Date  . BACK SURGERY    . BREAST SURGERY    . COLON SURGERY    . TONSILLECTOMY      Family history: Two brothers with DM.  Father deceased from CAD.  Mother deceased from breast cancer  Social History:  reports that she has never smoked. She has never used smokeless tobacco. She reports that she does not drink alcohol or use drugs.  Allergies:  Allergies  Allergen Reactions  . Benadryl [Diphenhydramine Hcl] Other (See Comments)    Pt states that it makes her legs jump.   . Codeine Nausea And Vomiting  . Oxycodone Nausea And Vomiting and Other (See Comments)    "Makes me real irritable and I feel terrible."  . Penicillins Other (See Comments)    Pt states that it does not work for her.     Medications:  I have reviewed the patient's current medications. Prior to Admission:  Prescriptions Prior to Admission  Medication Sig Dispense Refill Last Dose  . amLODipine (NORVASC) 5 MG tablet Take 5 mg by mouth every morning.    06/22/2016 at 0730  . levothyroxine (SYNTHROID, LEVOTHROID) 75 MCG tablet Take 75 mcg by mouth daily before breakfast.   06/22/2016 at 0730   Scheduled: . amLODipine  5 mg Oral BH-q7a  . aspirin  300 mg Rectal Daily   Or  . aspirin  325 mg Oral Daily  . atorvastatin  20 mg Oral q1800  . enoxaparin (LOVENOX) injection  40 mg Subcutaneous Q24H  . levETIRAcetam  500 mg Oral BID  . levothyroxine  75 mcg Oral QAC breakfast    ROS: History obtained from the patient  General ROS: negative for - chills, fatigue, fever, night sweats, weight gain or weight loss Psychological ROS:  negative for - behavioral disorder, hallucinations, memory difficulties, mood swings or suicidal ideation Ophthalmic ROS: negative for - blurry vision, double vision, eye pain or loss of vision ENT ROS: negative for - epistaxis, nasal discharge, oral lesions, sore throat, tinnitus or vertigo Allergy and Immunology ROS: negative for - hives or itchy/watery eyes Hematological and Lymphatic ROS: negative for - bleeding problems, bruising or swollen lymph nodes Endocrine ROS: negative for - galactorrhea, hair pattern changes, polydipsia/polyuria or temperature intolerance Respiratory ROS: negative for - cough, hemoptysis, shortness of breath or wheezing Cardiovascular ROS: negative for - chest pain, dyspnea on exertion, edema or irregular heartbeat Gastrointestinal ROS: negative for - abdominal pain, diarrhea, hematemesis, nausea/vomiting or stool incontinence Genito-Urinary ROS: negative for - dysuria, hematuria, incontinence or urinary frequency/urgency Musculoskeletal ROS: negative for - joint swelling or muscular weakness Neurological ROS: as noted in HPI Dermatological ROS: negative for rash and skin lesion changes  Physical Examination: Blood pressure (!) 115/53, pulse 85, temperature 97.9 F (36.6 C), temperature source Oral, resp. rate 20, height 5\' 5"  (1.651 m), weight 67.9 kg (149 lb 12.8 oz), SpO2 99 %.  HEENT-  Normocephalic, no lesions, without obvious abnormality.  Normal external eye and conjunctiva.  Normal TM's bilaterally.  Normal auditory canals and external ears. Normal external nose, mucus membranes and  septum.  Normal pharynx. Cardiovascular- S1, S2 normal, pulses palpable throughout   Lungs- chest clear, no wheezing, rales, normal symmetric air entry Abdomen- soft, non-tender; bowel sounds normal; no masses,  no organomegaly Extremities- no edema Lymph-no adenopathy palpable Musculoskeletal-no joint tenderness, deformity or swelling Skin-bruising on arms  Neurological  Examination Mental Status: Alert, oriented, thought content appropriate, tangential.  Speech fluent without evidence of aphasia.  Able to follow 3 step commands without difficulty. Cranial Nerves: II: Discs flat bilaterally; Visual fields grossly normal, pupils equal, round, reactive to light and accommodation III,IV, VI: ptosis not present, extra-ocular motions intact bilaterally V,VII: decreased left NLF, facial light touch sensation decreased on the left VIII: hearing normal bilaterally IX,X: gag reflex present XI: bilateral shoulder shrug XII: midline tongue extension Motor: Right : Upper extremity   5/5    Left:     Upper extremity   5/5 with clawing of the left hand  Lower extremity   5/5     Lower extremity   5/5 Tone and bulk:normal tone throughout; no atrophy noted Sensory: Pinprick and light touch decreased in the LLE Deep Tendon Reflexes: 1+ in the upper extremities and absent in the lower extremities Plantars: Right: mute   Left: mute Cerebellar: Normal finger-to-nose and normal heel-to-shin testing bilaterally Gait: not tested due to safety concerns   Laboratory Studies:  Basic Metabolic Panel:  Recent Labs Lab 06/23/16 0857 06/23/16 1731  NA 137  --   K 4.4  --   CL 108  --   CO2 23  --   GLUCOSE 101*  --   BUN 19  --   CREATININE 1.07*  --   CALCIUM 9.3  --   MG  --  2.1    Liver Function Tests:  Recent Labs Lab 06/23/16 0857  AST 20  ALT 7*  ALKPHOS 63  BILITOT 2.5*  PROT 6.8  ALBUMIN 4.3   No results for input(s): LIPASE, AMYLASE in the last 168 hours. No results for input(s): AMMONIA in the last 168 hours.  CBC:  Recent Labs Lab 06/23/16 0857  WBC 7.8  NEUTROABS 6.0  HGB 13.0  HCT 32.8*  MCV 101.9*  PLT 244    Cardiac Enzymes:  Recent Labs Lab 06/23/16 0857  TROPONINI <0.03    BNP: Invalid input(s): POCBNP  CBG: No results for input(s): GLUCAP in the last 168 hours.  Microbiology: Results for orders placed or  performed during the hospital encounter of 06/23/16  Urine culture     Status: None   Collection Time: 06/23/16  8:57 AM  Result Value Ref Range Status   Specimen Description URINE, RANDOM  Final   Special Requests NONE  Final   Culture NO GROWTH Performed at [facility]   Final   Report Status 06/24/2016 FINAL  Final    Coagulation Studies: No results for input(s): LABPROT, INR in the last 72 hours.  Urinalysis:   Recent Labs Lab 06/23/16 0857  COLORURINE YELLOW*  LABSPEC 1.012  PHURINE 6.0  GLUCOSEU NEGATIVE  HGBUR NEGATIVE  BILIRUBINUR NEGATIVE  KETONESUR NEGATIVE  PROTEINUR NEGATIVE  NITRITE NEGATIVE  LEUKOCYTESUR NEGATIVE    Lipid Panel:    Component Value Date/Time   CHOL 199 06/24/2016 0554   CHOL 188 03/12/2012 0456   TRIG 85 06/24/2016 0554   TRIG 145 03/12/2012 0456   HDL 51 06/24/2016 0554   HDL 41 03/12/2012 0456   CHOLHDL 3.9 06/24/2016 0554   VLDL 17 06/24/2016 0554   VLDL  29 03/12/2012 0456   LDLCALC 131 (H) 06/24/2016 0554   LDLCALC 118 (H) 03/12/2012 0456    HgbA1C:  Lab Results  Component Value Date   HGBA1C 3.9 (L) 03/11/2012    Urine Drug Screen:  No results found for: LABOPIA, COCAINSCRNUR, LABBENZ, AMPHETMU, THCU, LABBARB  Alcohol Level: No results for input(s): ETH in the last 168 hours.   Imaging: Dg Chest 2 View  Result Date: 06/23/2016 CLINICAL DATA:  Unresponsive EXAM: CHEST  2 VIEW COMPARISON:  02/16/2016 FINDINGS: Mild hyperinflation of the lungs. Heart is upper limits normal in size. Scattered aortic calcifications. Scarring in the apices. No confluent opacities or effusions. IMPRESSION: Mild hyperinflation/chronic changes.  No active disease. Electronically Signed   By: Rolm Baptise M.D.   On: 06/23/2016 09:54   Ct Head Wo Contrast  Result Date: 06/23/2016 CLINICAL DATA:  Altered mental status EXAM: CT HEAD WITHOUT CONTRAST TECHNIQUE: Contiguous axial images were obtained from the base of the skull through the  vertex without intravenous contrast. COMPARISON:  05/21/2016 FINDINGS: Brain: There is atrophy and chronic small vessel disease changes. Old lacunar infarct in the right basal ganglia, stable No acute intracranial abnormality. Specifically, no hemorrhage, hydrocephalus, mass lesion, acute infarction, or significant intracranial injury. Vascular: No hyperdense vessel or unexpected calcification. Skull: No acute calvarial abnormality. Sinuses/Orbits: Visualized paranasal sinuses and mastoids clear. Orbital soft tissues unremarkable. Other: None IMPRESSION: No acute intracranial abnormality. Atrophy, chronic microvascular disease. Old right basal ganglia lacunar infarct. Electronically Signed   By: Rolm Baptise M.D.   On: 06/23/2016 10:09   [name] [id number] Contrast  Result Date: 06/23/2016 CLINICAL DATA:  Weakness and difficulty speaking.  Prior stroke. EXAM: MRI HEAD WITHOUT AND WITH CONTRAST TECHNIQUE: Multiplanar, multiecho pulse sequences of the brain and surrounding structures were obtained without and with intravenous contrast. CONTRAST:  75mL MULTIHANCE GADOBENATE DIMEGLUMINE 529 MG/ML IV SOLN COMPARISON:  Head CT 06/23/2016 and MRI 03/12/2012 FINDINGS: The study is mildly motion degraded. Brain: There is no evidence of acute infarct, intra-axial mass, midline shift, or extra-axial fluid collection. A chronic infarct is again seen in the right basal ganglia and corona radiata with associated chronic blood products. There is mild ex vacuo enlargement of the right lateral ventricle at this level. Mild to moderate global cerebral atrophy is again seen. There is a homogeneously enhancing extra-axial mass in the inferomedial left occipital region extending superiorly from the tentorium. This measures 2.1 x 1.6 x 1.8 cm and has enlarged from the prior MRI (previously 1.3 x 1.0 x 1.2 cm). There is mild mass effect on the overlying left occipital lobe with a small amount of adjacent white matter T2 hyperintensity  which may reflect minimal edema, new from 2013. Vascular: Major intracranial vascular flow voids are preserved. Skull and upper cervical spine: Grossly unremarkable bone marrow signal. Sinuses/Orbits: Unremarkable. Other: None. IMPRESSION: 1. No acute intracranial abnormality. 2. Chronic right basal ganglia infarct. 3. 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 Electronically Signed   By: Logan Bores M.D.   On: 06/23/2016 14:56   US Carotid Bilateral (at [facility] And [facility] Only)  Result Date: 06/23/2016 CLINICAL DATA:  TIA symptoms EXAM: BILATERAL CAROTID DUPLEX ULTRASOUND TECHNIQUE: Pearline Cables scale imaging, color Doppler and duplex ultrasound were performed of bilateral carotid and vertebral arteries in the neck. COMPARISON:  None. FINDINGS: Criteria: Quantification of carotid stenosis is based on velocity parameters that correlate the residual internal carotid diameter with NASCET-based stenosis levels,  using the diameter of the distal internal carotid lumen as the denominator for stenosis measurement. The following velocity measurements were obtained: RIGHT ICA:  94/23 cm/sec CCA:  0000000 cm/sec SYSTOLIC ICA/CCA RATIO:  1.5 DIASTOLIC ICA/CCA RATIO:  2.1 ECA:  59 cm/sec LEFT ICA:  120/15 cm/sec CCA:  123456 cm/sec SYSTOLIC ICA/CCA RATIO:  1.6 DIASTOLIC ICA/CCA RATIO:  1.2 ECA:  65 cm/sec RIGHT CAROTID ARTERY: Mild heterogeneous and echogenic shadowing plaque formation. No hemodynamically significant right ICA stenosis, velocity elevation, or turbulent flow. Degree of narrowing less than 50%. RIGHT VERTEBRAL ARTERY:  Antegrade LEFT CAROTID ARTERY: Mild heterogeneous and partially calcified echogenic plaque formation. No hemodynamically significant left ICA stenosis, velocity elevation, or turbulent flow. LEFT VERTEBRAL ARTERY:  Antegrade IMPRESSION: Mild bilateral carotid atherosclerosis. No hemodynamically significant ICA stenosis. Degree of narrowing  less than 50% bilaterally. Patent antegrade vertebral flow bilaterally Electronically Signed   By: Jerilynn Mages.  Shick M.D.   On: 06/23/2016 15:31    Assessment: 82 y.o. female with history of stroke presenting with an episode of unresponsiveness followed by difficulty with speech.  Patient now at baseline with resolution of left hemiparesis.  MRI no acute abnormalities by chronic L occipital meningioma which is incidental finding.  No further imaging from neuro stand point Call with questions.   06/24/2016, 1:38 PM

## 2016-06-24 NOTE — Discharge Summary (Signed)
Vanessa [name], 82 y.o., DOB December 03, 1930, MRN [id number]. Admission date: 06/23/2016 Discharge Date 06/24/2016 Primary MD Sabino Snipes KEY, MD Admitting Physician Bettey Costa, MD  Admission Diagnosis  Transient cerebral ischemia, unspecified transient cerebral ischemia type [G45.9]  Discharge Diagnosis   Active Problems:   TIA (transient ischemic attack)  Possible seizure Previous history of CVA Diabetes type 2 History of colon cancer hypothyroidism meningioma       Hospital Course  Vanessa [name]  is a 82 y.o. female with a known history of CVA and hypothyroidism who arrived via EMS for unresponsiveness and aphasia. Patient had a similar type episode last [facility]. She was admitted initially for TIA she underwent MRI of the brain which showed expanding meningioma. Carotid Doppler showed no significant change. There is a concern that her symptoms [name] be related to a seizure. I started her on Keppra. She'll follow outpatient with neurology [name] need a EEG. Currently her symptoms are normal and she is feeling back to baseline.           Consults  neurology  Significant Tests:  See full reports for all details     Dg Chest 2 View  Result Date: 06/23/2016 CLINICAL DATA:  Unresponsive EXAM: CHEST  2 VIEW COMPARISON:  02/16/2016 FINDINGS: Mild hyperinflation of the lungs. Heart is upper limits normal in size. Scattered aortic calcifications. Scarring in the apices. No confluent opacities or effusions. IMPRESSION: Mild hyperinflation/chronic changes.  No active disease. Electronically Signed   By: Rolm Baptise M.D.   On: 06/23/2016 09:54   Ct Head Wo Contrast  Result Date: 06/23/2016 CLINICAL DATA:  Altered mental status EXAM: CT HEAD WITHOUT CONTRAST TECHNIQUE: Contiguous axial images were obtained from the base of the skull through the vertex without intravenous contrast. COMPARISON:  05/21/2016 FINDINGS: Brain: There is atrophy and chronic small vessel disease changes. Old lacunar  infarct in the right basal ganglia, stable No acute intracranial abnormality. Specifically, no hemorrhage, hydrocephalus, mass lesion, acute infarction, or significant intracranial injury. Vascular: No hyperdense vessel or unexpected calcification. Skull: No acute calvarial abnormality. Sinuses/Orbits: Visualized paranasal sinuses and mastoids clear. Orbital soft tissues unremarkable. Other: None IMPRESSION: No acute intracranial abnormality. Atrophy, chronic microvascular disease. Old right basal ganglia lacunar infarct. Electronically Signed   By: Rolm Baptise M.D.   On: 06/23/2016 10:09   [name] [id number] Contrast  Result Date: 06/23/2016 CLINICAL DATA:  Weakness and difficulty speaking.  Prior stroke. EXAM: MRI HEAD WITHOUT AND WITH CONTRAST TECHNIQUE: Multiplanar, multiecho pulse sequences of the brain and surrounding structures were obtained without and with intravenous contrast. CONTRAST:  24mL MULTIHANCE GADOBENATE DIMEGLUMINE 529 MG/ML IV SOLN COMPARISON:  Head CT 06/23/2016 and MRI 03/12/2012 FINDINGS: The study is mildly motion degraded. Brain: There is no evidence of acute infarct, intra-axial mass, midline shift, or extra-axial fluid collection. A chronic infarct is again seen in the right basal ganglia and corona radiata with associated chronic blood products. There is mild ex vacuo enlargement of the right lateral ventricle at this level. Mild to moderate global cerebral atrophy is again seen. There is a homogeneously enhancing extra-axial mass in the inferomedial left occipital region extending superiorly from the tentorium. This measures 2.1 x 1.6 x 1.8 cm and has enlarged from the prior MRI (previously 1.3 x 1.0 x 1.2 cm). There is mild mass effect on the overlying left occipital lobe with a small amount of adjacent white matter T2 hyperintensity which [name] reflect minimal edema, new from 2013. Vascular: Major intracranial vascular flow  voids are preserved. Skull and upper cervical spine: Grossly  unremarkable bone marrow signal. Sinuses/Orbits: Unremarkable. Other: None. IMPRESSION: 1. No acute intracranial abnormality. 2. Chronic right basal ganglia infarct. 3. 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 Electronically Signed   By: Logan Bores M.D.   On: 06/23/2016 14:56   US Carotid Bilateral (at [facility] And [facility] Only)  Result Date: 06/23/2016 CLINICAL DATA:  TIA symptoms EXAM: BILATERAL CAROTID DUPLEX ULTRASOUND TECHNIQUE: Pearline Cables scale imaging, color Doppler and duplex ultrasound were performed of bilateral carotid and vertebral arteries in the neck. COMPARISON:  None. FINDINGS: Criteria: Quantification of carotid stenosis is based on velocity parameters that correlate the residual internal carotid diameter with NASCET-based stenosis levels, using the diameter of the distal internal carotid lumen as the denominator for stenosis measurement. The following velocity measurements were obtained: RIGHT ICA:  94/23 cm/sec CCA:  0000000 cm/sec SYSTOLIC ICA/CCA RATIO:  1.5 DIASTOLIC ICA/CCA RATIO:  2.1 ECA:  59 cm/sec LEFT ICA:  120/15 cm/sec CCA:  123456 cm/sec SYSTOLIC ICA/CCA RATIO:  1.6 DIASTOLIC ICA/CCA RATIO:  1.2 ECA:  65 cm/sec RIGHT CAROTID ARTERY: Mild heterogeneous and echogenic shadowing plaque formation. No hemodynamically significant right ICA stenosis, velocity elevation, or turbulent flow. Degree of narrowing less than 50%. RIGHT VERTEBRAL ARTERY:  Antegrade LEFT CAROTID ARTERY: Mild heterogeneous and partially calcified echogenic plaque formation. No hemodynamically significant left ICA stenosis, velocity elevation, or turbulent flow. LEFT VERTEBRAL ARTERY:  Antegrade IMPRESSION: Mild bilateral carotid atherosclerosis. No hemodynamically significant ICA stenosis. Degree of narrowing less than 50% bilaterally. Patent antegrade vertebral flow bilaterally Electronically Signed   By: Jerilynn Mages.  Shick M.D.   On: 06/23/2016 15:31       Today    Subjective:   Vanessa [name]  Feels well denies any complaints  Objective:   Blood pressure (!) 115/53, pulse 85, temperature 97.9 F (36.6 C), temperature source Oral, resp. rate 20, height 5\' 5"  (1.651 m), weight 149 lb 12.8 oz (67.9 kg), SpO2 99 %.  .  Intake/Output Summary (Last 24 hours) at 06/24/16 1449 Last data filed at 06/24/16 0954  Gross per 24 hour  Intake             1220 ml  Output                0 ml  Net             1220 ml    Exam VITAL SIGNS: Blood pressure (!) 115/53, pulse 85, temperature 97.9 F (36.6 C), temperature source Oral, resp. rate 20, height 5\' 5"  (1.651 m), weight 149 lb 12.8 oz (67.9 kg), SpO2 99 %.  GENERAL:  82 y.o.-year-old patient lying in the bed with no acute distress.  EYES: Pupils equal, round, reactive to light and accommodation. No scleral icterus. Extraocular muscles intact.  HEENT: Head atraumatic, normocephalic. Oropharynx and nasopharynx clear.  NECK:  Supple, no jugular venous distention. No thyroid enlargement, no tenderness.  LUNGS: Normal breath sounds bilaterally, no wheezing, rales,rhonchi or crepitation. No use of accessory muscles of respiration.  CARDIOVASCULAR: S1, S2 normal. No murmurs, rubs, or gallops.  ABDOMEN: Soft, nontender, nondistended. Bowel sounds present. No organomegaly or mass.  EXTREMITIES: No pedal edema, cyanosis, or clubbing.  NEUROLOGIC: Cranial nerves II through XII are intact.The left upper extremity 4 out of 5 strength. Sensation intact. Gait not checked.  PSYCHIATRIC: The patient is alert and oriented x 3.  SKIN: No obvious rash, lesion, or ulcer.  Data Review     CBC w Diff: Lab Results  Component Value Date   WBC 7.8 06/23/2016   HGB 13.0 06/23/2016   HGB 12.5 01/23/2015   HCT 32.8 (L) 06/23/2016   HCT 34.7 (L) 01/23/2015   PLT 244 06/23/2016   PLT 211 01/23/2015   LYMPHOPCT 14 06/23/2016   LYMPHOPCT 17.8 08/25/2014   MONOPCT 9 06/23/2016   MONOPCT 8.7 08/25/2014   EOSPCT 0 06/23/2016    EOSPCT 0.0 08/25/2014   BASOPCT 0 06/23/2016   BASOPCT 0.0 08/25/2014   CMP: Lab Results  Component Value Date   NA 137 06/23/2016   NA 135 01/23/2015   K 4.4 06/23/2016   K 4.9 01/23/2015   CL 108 06/23/2016   CL 103 01/23/2015   CO2 23 06/23/2016   CO2 25 01/23/2015   BUN 19 06/23/2016   BUN 30 (H) 01/23/2015   CREATININE 1.07 (H) 06/23/2016   CREATININE 1.07 (H) 01/23/2015   PROT 6.8 06/23/2016   PROT 7.0 01/23/2015   ALBUMIN 4.3 06/23/2016   ALBUMIN 4.5 01/23/2015   BILITOT 2.5 (H) 06/23/2016   BILITOT 1.8 (H) 01/23/2015   ALKPHOS 63 06/23/2016   ALKPHOS 61 01/23/2015   AST 20 06/23/2016   AST 29 01/23/2015   ALT 7 (L) 06/23/2016   ALT 14 01/23/2015  .  Micro Results Recent Results (from the past 240 hour(s))  Urine culture     Status: None   Collection Time: 06/23/16  8:57 AM  Result Value Ref Range Status   Specimen Description URINE, RANDOM  Final   Special Requests NONE  Final   Culture NO GROWTH Performed at [facility]   Final   Report Status 06/24/2016 FINAL  Final        Code Status Orders        Start     Ordered   06/23/16 1254  Do not attempt resuscitation (DNR)  Continuous    Question Answer Comment  In the event of cardiac or respiratory ARREST Do not call a "code blue"   In the event of cardiac or respiratory ARREST Do not perform Intubation, CPR, defibrillation or ACLS   In the event of cardiac or respiratory ARREST Use medication by any route, position, wound care, and other measures to relive pain and suffering. [name] use oxygen, suction and manual treatment of airway obstruction as needed for comfort.      06/23/16 1253    Code Status History    Date Active Date Inactive Code Status Order ID Comments User Context   06/23/2016 12:53 PM 06/24/2016  7:04 AM DNR [id number]  Bettey Costa, MD Inpatient   06/23/2016 12:11 PM 06/23/2016 12:53 PM Full Code [id number]  Bettey Costa, MD Inpatient   06/23/2016 11:18 AM 06/23/2016 12:11 PM DNR  [id number]  Bettey Costa, MD ED    Questions for Most Recent Historical Code Status (Order [id number])    Question Answer Comment   In the event of cardiac or respiratory ARREST Do not call a "code blue"    In the event of cardiac or respiratory ARREST Do not perform Intubation, CPR, defibrillation or ACLS    In the event of cardiac or respiratory ARREST Use medication by any route, position, wound care, and other measures to relive pain and suffering. [name] use oxygen, suction and manual treatment of airway obstruction as needed for comfort.           Follow-up Information    SOLES, MEREDITH KEY,  MD Follow up in 7 day(s).   Specialty:  Family Medicine Contact information: [address] [place] [address] [phone]        Vladimir Crofts, MD Follow up in 2 week(s).   Specialty:  Neurology Why:  Kris Hartmann Contact information: [place] [facility] [place] [address] [phone]           Discharge Medications     Medication List    TAKE these medications   amLODipine 5 MG tablet Commonly known as:  NORVASC Take 5 mg by mouth every morning.   aspirin 325 MG tablet Take 1 tablet (325 mg total) by mouth daily. Start taking on:  06/25/2016   atorvastatin 20 MG tablet Commonly known as:  LIPITOR Take 1 tablet (20 mg total) by mouth daily at 6 PM.   levETIRAcetam 500 MG tablet Commonly known as:  KEPPRA Take 1 tablet (500 mg total) by mouth 2 (two) times daily.   levothyroxine 75 MCG tablet Commonly known as:  SYNTHROID, LEVOTHROID Take 75 mcg by mouth daily before breakfast.          Total Time in preparing paper work, data evaluation and todays exam - 35 minutes  Dustin Flock M.D on 06/24/2016 at 2:49 PM  [facility]   Office  [phone]

## 2016-06-25 LAB — ECHOCARDIOGRAM COMPLETE
HEIGHTINCHES: 65 in
WEIGHTICAEL: 2396.8 [oz_av]

## 2016-06-26 LAB — HEMOGLOBIN A1C: Hgb A1c MFr Bld: <4.2 % — ABNORMAL LOW (ref 4.8–5.6)

## 2017-03-11 ENCOUNTER — Encounter: Payer: Self-pay | Admitting: Emergency Medicine

## 2017-03-11 ENCOUNTER — Emergency Department: Payer: Medicare Other

## 2017-03-11 ENCOUNTER — Emergency Department
Admission: EM | Admit: 2017-03-11 | Discharge: 2017-03-11 | Disposition: A | Discharge status: Home / Self Care | Payer: Medicare Other | Attending: Emergency Medicine | Admitting: Emergency Medicine

## 2017-03-11 DIAGNOSIS — Z7982 Long term (current) use of aspirin: Secondary | ICD-10-CM | POA: Diagnosis not present

## 2017-03-11 DIAGNOSIS — I252 Old myocardial infarction: Secondary | ICD-10-CM | POA: Insufficient documentation

## 2017-03-11 DIAGNOSIS — E039 Hypothyroidism, unspecified: Secondary | ICD-10-CM | POA: Insufficient documentation

## 2017-03-11 DIAGNOSIS — Z8673 Personal history of transient ischemic attack (TIA), and cerebral infarction without residual deficits: Secondary | ICD-10-CM | POA: Diagnosis not present

## 2017-03-11 DIAGNOSIS — E860 Dehydration: Secondary | ICD-10-CM | POA: Diagnosis not present

## 2017-03-11 DIAGNOSIS — Z85038 Personal history of other malignant neoplasm of large intestine: Secondary | ICD-10-CM | POA: Diagnosis not present

## 2017-03-11 DIAGNOSIS — E119 Type 2 diabetes mellitus without complications: Secondary | ICD-10-CM | POA: Insufficient documentation

## 2017-03-11 DIAGNOSIS — R531 Weakness: Secondary | ICD-10-CM | POA: Diagnosis present

## 2017-03-11 LAB — URINALYSIS, COMPLETE (UACMP) WITH MICROSCOPIC
Bacteria, UA: NONE SEEN
Bilirubin Urine: NEGATIVE
Glucose, UA: NEGATIVE mg/dL
HGB URINE DIPSTICK: NEGATIVE
Ketones, ur: NEGATIVE mg/dL
Nitrite: NEGATIVE
PH: 5 (ref 5.0–8.0)
Protein, ur: NEGATIVE mg/dL
SPECIFIC GRAVITY, URINE: 1.018 (ref 1.005–1.030)

## 2017-03-11 LAB — CBC
HCT: 35.4 % (ref 35.0–47.0)
Hemoglobin: 12.9 g/dL (ref 12.0–16.0)
MCH: 36.3 pg — ABNORMAL HIGH (ref 26.0–34.0)
MCHC: 36.4 g/dL — ABNORMAL HIGH (ref 32.0–36.0)
MCV: 99.7 fL (ref 80.0–100.0)
PLATELETS: 250 10*3/uL (ref 150–440)
RBC: 3.55 MIL/uL — AB (ref 3.80–5.20)
RDW: 14.6 % — ABNORMAL HIGH (ref 11.5–14.5)
WBC: 8 10*3/uL (ref 3.6–11.0)

## 2017-03-11 LAB — BASIC METABOLIC PANEL
Anion gap: 9 (ref 5–15)
BUN: 26 mg/dL — ABNORMAL HIGH (ref 6–20)
CHLORIDE: 106 mmol/L (ref 101–111)
CO2: 23 mmol/L (ref 22–32)
CREATININE: 1.37 mg/dL — AB (ref 0.44–1.00)
Calcium: 9.4 mg/dL (ref 8.9–10.3)
GFR calc non Af Amer: 34 mL/min — ABNORMAL LOW (ref >60)
GFR, EST AFRICAN AMERICAN: 40 mL/min — AB (ref >60)
Glucose, Bld: 141 mg/dL — ABNORMAL HIGH (ref 65–99)
Potassium: 4 mmol/L (ref 3.5–5.1)
Sodium: 138 mmol/L (ref 135–145)

## 2017-03-11 LAB — GLUCOSE, CAPILLARY: Glucose-Capillary: 140 mg/dL — ABNORMAL HIGH (ref 65–99)

## 2017-03-11 LAB — TROPONIN I: Troponin I: <0.03 ng/mL (ref <0.03)

## 2017-03-11 MED ORDER — SODIUM CHLORIDE 0.9 % IV BOLUS (SEPSIS)
1000.0000 mL | Freq: Once | INTRAVENOUS | Status: AC
  Administered 2017-03-11: 1000 mL via INTRAVENOUS

## 2017-03-11 MED ORDER — SODIUM CHLORIDE 0.9 % IV BOLUS (SEPSIS): 1000.0000 mL | Freq: Once | INTRAVENOUS | Status: DC

## 2017-03-11 NOTE — Discharge Instructions (Signed)
You were evaluated for generalized weakness and were treated for dehydration.  Return to the emergency department immediately for any worsening condition including confusion altered mental status, chest pain, trouble breathing, fever, dizziness or passing out, or any other symptoms concerning to you.

## 2017-03-11 NOTE — ED Notes (Signed)
Pt provided water to drink. Husband provided water as well.

## 2017-03-11 NOTE — ED Notes (Signed)
Pt wanted this RN to tell husband she is here. Husband name Lorrin Goodell, number [phone]. Did not answer when this Rn called .

## 2017-03-11 NOTE — ED Notes (Signed)
Called daughter [name] who stated she would be here to pick pt and husband up in 2 minutes

## 2017-03-11 NOTE — ED Provider Notes (Signed)
[facility] Emergency Department Provider Note ____________________________________________   I have reviewed the triage vital signs and the triage nursing note.  HISTORY  Chief Complaint Weakness   Historian Patient  HPI Vanessa [name] is a 83 y.o. female presents with a history of diabetes, coronary disease, diabetes, stroke, seizures for which takes Keppra, colon cancer, as well as meningioma, today to the ER due to a feeling of generalized weakness. She also states like she is not thinking clearly. States her husband left this morning and she could barely make it to the door due to fatigue to call her neighbor over who then called EMS.  No fever. No abdominal pain. No chest pain. She states that she always has heavy breathing, but does not have any known lung disease.  Denies headache. Initially told me she thought she might have had a seizure, but was unable to tell me why she thought that. There is no incontinence or biting of the tongue. She states that she feels like she just is weak all over and having trouble with severe fatigue since this morning. Denies chest pain or palpitations or any focal weakness or numbness.    Past Medical History:  Diagnosis Date  . Colon cancer ([place])   . Diabetes mellitus without complication ([place])   . Stroke ([place])   . Thyroid disorder     Patient Active Problem List   Diagnosis Date Noted  . TIA (transient ischemic attack) 06/23/2016  . Absolute anemia 05/30/2015  . B12 deficiency 05/30/2015  . Cerebral vascular accident ([place]) 05/30/2015  . Colon cancer ([place]) 05/30/2015  . Diabetes mellitus, type 2 ([place]) 05/30/2015  . HLD (hyperlipidemia) 05/30/2015  . Heart attack ([place]) 05/30/2015  . Adult hypothyroidism 05/30/2015  . Avitaminosis D 05/30/2015  . Enthesopathy of hip 01/05/2014  . Temporary cerebral vascular dysfunction 03/02/2012    Past Surgical History:  Procedure Laterality Date  . BACK SURGERY    .  BREAST SURGERY    . COLON SURGERY    . TONSILLECTOMY      Prior to Admission medications   Medication Sig Start Date End Date Taking? Authorizing Provider  amLODipine (NORVASC) 5 MG tablet Take 5 mg by mouth every morning.    Yes [provider]  aspirin 325 MG tablet Take 1 tablet (325 mg total) by mouth daily. 06/25/16  Yes Dustin Flock, MD  atorvastatin (LIPITOR) 20 MG tablet Take 1 tablet (20 mg total) by mouth daily at 6 PM. 06/24/16  Yes Dustin Flock, MD  levETIRAcetam (KEPPRA) 500 MG tablet Take 1 tablet (500 mg total) by mouth 2 (two) times daily. 06/24/16  Yes Dustin Flock, MD  levothyroxine (SYNTHROID, LEVOTHROID) 75 MCG tablet Take 75 mcg by mouth daily before breakfast.   Yes [provider]    Allergies  Allergen Reactions  . Benadryl [Diphenhydramine Hcl] Other (See Comments)    Pt states that it makes her legs jump.   . Codeine Nausea And Vomiting  . Oxycodone Nausea And Vomiting and Other (See Comments)    "Makes me real irritable and I feel terrible."  . Penicillins Other (See Comments)    Pt states that it does not work for her.     History reviewed. No pertinent family history.  Social History Social History  Substance Use Topics  . Smoking status: Never Smoker  . Smokeless tobacco: Never Used  . Alcohol use No    Review of Systems  Constitutional: Negative for fever. Eyes: Negative  for visual changes. ENT: Negative for sore throat. Cardiovascular: Negative for chest pain. Respiratory: Negative for shortness of breath. Gastrointestinal: Negative for abdominal pain, vomiting and diarrhea. Genitourinary: Negative for dysuria. Musculoskeletal: Negative for back pain. Skin: Negative for rash. Neurological: Negative for headache.  ____________________________________________   PHYSICAL EXAM:  VITAL SIGNS: ED Triage Vitals  Enc Vitals Group     BP 03/11/17 0843 124/66     Pulse Rate 03/11/17 0843 98     Resp 03/11/17 0843  18     Temp 03/11/17 0843 97.9 F (36.6 C)     Temp Source 03/11/17 0843 Oral     SpO2 03/11/17 0843 96 %     Weight 03/11/17 0843 150 lb (68 kg)     Height 03/11/17 0843 5\' 5"  (1.651 m)     Head Circumference --      Peak Flow --      Pain Score 03/11/17 0837 0     Pain Loc --      Pain Edu? --      Excl. in [place]? --      Constitutional: Alert and oriented. Well appearing and in no distress. HEENT   Head: Normocephalic and atraumatic.      Eyes: Conjunctivae are normal. Pupils equal and round.       Ears:         Nose: No congestion/rhinnorhea.   Mouth/Throat: Mucous membranes are moist.   Neck: No stridor. Cardiovascular/Chest: Normal rate, regular rhythm.  No murmurs, rubs, or gallops. Respiratory: Normal respiratory effort without tachypnea nor retractions. Breath sounds are clear and equal bilaterally. No wheezes/rales/rhonchi. Gastrointestinal: Soft. No distention, no guarding, no rebound. Nontender.    Genitourinary/rectal:Deferred Musculoskeletal: Nontender with normal range of motion in all extremities. No joint effusions.  No lower extremity tenderness.  No edema. Neurologic:  No facial droop. Normal speech and language. No gross or focal neurologic deficits are appreciated. Skin:  Skin is warm, dry and intact. No rash noted. Psychiatric: Mood and affect are normal. Speech and behavior are normal. Patient exhibits appropriate insight and judgment.   ____________________________________________  LABS (pertinent positives/negatives)  Labs Reviewed  BASIC METABOLIC PANEL - Abnormal; Notable for the following:       Result Value   Glucose, Bld 141 (*)    BUN 26 (*)    Creatinine, Ser 1.37 (*)    GFR calc non Af Amer 34 (*)    GFR calc Af Amer 40 (*)    All other components within normal limits  CBC - Abnormal; Notable for the following:    RBC 3.55 (*)    MCH 36.3 (*)    MCHC 36.4 (*)    RDW 14.6 (*)    All other components within normal limits   URINALYSIS, COMPLETE (UACMP) WITH MICROSCOPIC - Abnormal; Notable for the following:    Color, Urine YELLOW (*)    APPearance CLEAR (*)    Leukocytes, UA TRACE (*)    Squamous Epithelial / LPF 0-5 (*)    All other components within normal limits  GLUCOSE, CAPILLARY - Abnormal; Notable for the following:    Glucose-Capillary 140 (*)    All other components within normal limits  URINE CULTURE  TROPONIN I  CBG MONITORING, ED    ____________________________________________    EKG I, Lisa Roca, MD, the attending physician have personally viewed and interpreted all ECGs.  96 bpm. Normal sinus rhythm. Likely left axis deviation. Nonspecific ST and T-wave ____________________________________________  RADIOLOGY All Xrays  were viewed by me. Imaging interpreted by Radiologist.  CT head without contrast:  IMPRESSION: Atrophy. Prior infarct in the right anterior basal ganglia region, stable. Patchy periventricular small vessel disease is stable. No acute infarct evident. Subtle left occipital region meningioma arising from the posterior falx is not felt to be appreciably change. On this noncontrast enhanced study, it has attenuation is essentially identical to surrounding brain parenchyma, and there is no appreciable edema. There is subtle mass effect on the occipital horn of the lateral ventricle, best appreciable on coronal image.  There are foci of arterial vascular calcification. There is mild ethmoid sinus disease. __________________________________________  PROCEDURES  Procedure(s) performed: None  Critical Care performed: None  ____________________________________________   ED COURSE / ASSESSMENT AND PLAN  Pertinent labs & imaging results that were available during my care of the patient were reviewed by me and considered in my medical decision making (see chart for details).   Ms. Lagrange has a number of underlying comorbidities, and complaint today is fairly  general in terms of generalized weakness/fatigue. No focal symptoms in terms of cardiac, pulmonary, or neurologic symptoms or findings.  Laboratory studies indicate slightly elevated BUN and creatinine consistent with a possible mild dehydration. She was given IV fluid bolus here.  The rest of her laboratory evaluations are reassuring. Next the next Head CT shows no acute findings.   CONSULTATIONS:   None   Patient / Family / Caregiver informed of clinical course, medical decision-making process, and agree with plan.   I discussed return precautions, follow-up instructions, and discharge instructions with patient and/or family.  Discharge Instructions : You were evaluated for jaundice weakness and were treated for dehydration.  Return to the emergency department immediately for any worsening condition including confusion altered mental status, chest pain, trouble breathing, fever, dizziness or passing out, or any other symptoms concerning to you.  ___________________________________________   FINAL CLINICAL IMPRESSION(S) / ED DIAGNOSES   Final diagnoses:  Dehydration              Note: This dictation was prepared with Dragon dictation. Any transcriptional errors that result from this process are unintentional    Lisa Roca, MD 03/11/17 1254

## 2017-03-11 NOTE — ED Triage Notes (Signed)
Pt arrived via EMS from home with reports of weakness and pt reporting that she didn't feel well.  Pt is from home with husband, EMS states the husband went out to run errands and is not supposed to leave her alone. A neighbor went to check on her and reported to EMS that she wasn't acting right. Pt has hx of MI x2, CVAx2 and has brain tumor.  Pt is alert and oriented at this time, no slurred speech.

## 2017-03-11 NOTE — ED Notes (Signed)
Husband at bedside. Husband provided cup of water.

## 2017-03-12 LAB — URINE CULTURE: CULTURE: NO GROWTH

## 2017-06-21 ENCOUNTER — Observation Stay: Payer: Medicare Other

## 2017-06-21 ENCOUNTER — Emergency Department: Payer: Medicare Other

## 2017-06-21 ENCOUNTER — Observation Stay
Admission: EM | Admit: 2017-06-21 | Discharge: 2017-06-22 | Disposition: A | Discharge status: Home / Self Care | Payer: Medicare Other | Attending: Internal Medicine | Admitting: Internal Medicine

## 2017-06-21 ENCOUNTER — Observation Stay
Admit: 2017-06-21 | Discharge: 2017-06-21 | Disposition: A | Discharge status: Home / Self Care | Payer: Medicare Other | Attending: Internal Medicine | Admitting: Internal Medicine

## 2017-06-21 DIAGNOSIS — Z888 Allergy status to other drugs, medicaments and biological substances status: Secondary | ICD-10-CM | POA: Diagnosis not present

## 2017-06-21 DIAGNOSIS — E039 Hypothyroidism, unspecified: Secondary | ICD-10-CM | POA: Diagnosis not present

## 2017-06-21 DIAGNOSIS — I69354 Hemiplegia and hemiparesis following cerebral infarction affecting left non-dominant side: Secondary | ICD-10-CM | POA: Diagnosis not present

## 2017-06-21 DIAGNOSIS — I252 Old myocardial infarction: Secondary | ICD-10-CM | POA: Insufficient documentation

## 2017-06-21 DIAGNOSIS — M542 Cervicalgia: Secondary | ICD-10-CM | POA: Diagnosis not present

## 2017-06-21 DIAGNOSIS — G3189 Other specified degenerative diseases of nervous system: Secondary | ICD-10-CM | POA: Diagnosis not present

## 2017-06-21 DIAGNOSIS — G459 Transient cerebral ischemic attack, unspecified: Secondary | ICD-10-CM | POA: Diagnosis not present

## 2017-06-21 DIAGNOSIS — Z85038 Personal history of other malignant neoplasm of large intestine: Secondary | ICD-10-CM | POA: Insufficient documentation

## 2017-06-21 DIAGNOSIS — Z880 Allergy status to penicillin: Secondary | ICD-10-CM | POA: Insufficient documentation

## 2017-06-21 DIAGNOSIS — I4510 Unspecified right bundle-branch block: Secondary | ICD-10-CM | POA: Diagnosis not present

## 2017-06-21 DIAGNOSIS — I10 Essential (primary) hypertension: Secondary | ICD-10-CM | POA: Insufficient documentation

## 2017-06-21 DIAGNOSIS — R202 Paresthesia of skin: Secondary | ICD-10-CM | POA: Diagnosis present

## 2017-06-21 DIAGNOSIS — Z885 Allergy status to narcotic agent status: Secondary | ICD-10-CM | POA: Insufficient documentation

## 2017-06-21 DIAGNOSIS — Z79899 Other long term (current) drug therapy: Secondary | ICD-10-CM | POA: Diagnosis not present

## 2017-06-21 DIAGNOSIS — Z8249 Family history of ischemic heart disease and other diseases of the circulatory system: Secondary | ICD-10-CM | POA: Insufficient documentation

## 2017-06-21 DIAGNOSIS — Z7982 Long term (current) use of aspirin: Secondary | ICD-10-CM | POA: Insufficient documentation

## 2017-06-21 DIAGNOSIS — E559 Vitamin D deficiency, unspecified: Secondary | ICD-10-CM | POA: Insufficient documentation

## 2017-06-21 DIAGNOSIS — E538 Deficiency of other specified B group vitamins: Secondary | ICD-10-CM | POA: Diagnosis not present

## 2017-06-21 DIAGNOSIS — E119 Type 2 diabetes mellitus without complications: Secondary | ICD-10-CM | POA: Insufficient documentation

## 2017-06-21 DIAGNOSIS — I639 Cerebral infarction, unspecified: Secondary | ICD-10-CM | POA: Diagnosis not present

## 2017-06-21 DIAGNOSIS — E785 Hyperlipidemia, unspecified: Secondary | ICD-10-CM | POA: Insufficient documentation

## 2017-06-21 DIAGNOSIS — D320 Benign neoplasm of cerebral meninges: Secondary | ICD-10-CM | POA: Insufficient documentation

## 2017-06-21 LAB — COMPREHENSIVE METABOLIC PANEL
ALBUMIN: 4.1 g/dL (ref 3.5–5.0)
ALK PHOS: 58 U/L (ref 38–126)
ALT: 8 U/L — AB (ref 14–54)
AST: 20 U/L (ref 15–41)
Anion gap: 7 (ref 5–15)
BUN: 21 mg/dL — AB (ref 6–20)
CALCIUM: 9.1 mg/dL (ref 8.9–10.3)
CO2: 25 mmol/L (ref 22–32)
CREATININE: 1.12 mg/dL — AB (ref 0.44–1.00)
Chloride: 107 mmol/L (ref 101–111)
GFR calc Af Amer: 51 mL/min — ABNORMAL LOW (ref >60)
GFR calc non Af Amer: 44 mL/min — ABNORMAL LOW (ref >60)
GLUCOSE: 110 mg/dL — AB (ref 65–99)
Potassium: 4.1 mmol/L (ref 3.5–5.1)
SODIUM: 139 mmol/L (ref 135–145)
Total Bilirubin: 2 mg/dL — ABNORMAL HIGH (ref 0.3–1.2)
Total Protein: 6.7 g/dL (ref 6.5–8.1)

## 2017-06-21 LAB — GLUCOSE, CAPILLARY
Glucose-Capillary: 108 mg/dL — ABNORMAL HIGH (ref 65–99)
Glucose-Capillary: 130 mg/dL — ABNORMAL HIGH (ref 65–99)
Glucose-Capillary: 120 mg/dL — ABNORMAL HIGH (ref 65–99)

## 2017-06-21 LAB — CBC
HCT: 33.9 % — ABNORMAL LOW (ref 35.0–47.0)
HEMOGLOBIN: 12.5 g/dL (ref 12.0–16.0)
MCH: 36.6 pg — ABNORMAL HIGH (ref 26.0–34.0)
MCHC: 36.9 g/dL — ABNORMAL HIGH (ref 32.0–36.0)
MCV: 99.4 fL (ref 80.0–100.0)
PLATELETS: 214 10*3/uL (ref 150–440)
RBC: 3.42 MIL/uL — AB (ref 3.80–5.20)
RDW: 14.9 % — ABNORMAL HIGH (ref 11.5–14.5)
WBC: 5.8 10*3/uL (ref 3.6–11.0)

## 2017-06-21 LAB — URINALYSIS, ROUTINE W REFLEX MICROSCOPIC
Bilirubin Urine: NEGATIVE
GLUCOSE, UA: NEGATIVE mg/dL
Hgb urine dipstick: NEGATIVE
KETONES UR: NEGATIVE mg/dL
LEUKOCYTES UA: NEGATIVE
Nitrite: NEGATIVE
PH: 5 (ref 5.0–8.0)
Protein, ur: NEGATIVE mg/dL
Specific Gravity, Urine: 1.017 (ref 1.005–1.030)

## 2017-06-21 LAB — DIFFERENTIAL
Basophils Absolute: 0 10*3/uL (ref 0–0.1)
Basophils Relative: 0 %
Eosinophils Absolute: 0 10*3/uL (ref 0–0.7)
Eosinophils Relative: 0 %
LYMPHS PCT: 24 %
Lymphs Abs: 1.4 10*3/uL (ref 1.0–3.6)
Monocytes Absolute: 0.6 10*3/uL (ref 0.2–0.9)
Monocytes Relative: 11 %
NEUTROS ABS: 3.8 10*3/uL (ref 1.4–6.5)
NEUTROS PCT: 65 %

## 2017-06-21 LAB — URINE DRUG SCREEN, QUALITATIVE (ARMC ONLY)
Amphetamines, Ur Screen: NOT DETECTED
BARBITURATES, UR SCREEN: NOT DETECTED
Benzodiazepine, Ur Scrn: NOT DETECTED
CANNABINOID 50 NG, UR ~~LOC~~: NOT DETECTED
COCAINE METABOLITE, UR ~~LOC~~: NOT DETECTED
MDMA (Ecstasy)Ur Screen: NOT DETECTED
METHADONE SCREEN, URINE: NOT DETECTED
OPIATE, UR SCREEN: NOT DETECTED
Phencyclidine (PCP) Ur S: NOT DETECTED
Tricyclic, Ur Screen: NOT DETECTED

## 2017-06-21 LAB — ETHANOL: Alcohol, Ethyl (B): <5 mg/dL (ref <5)

## 2017-06-21 LAB — TROPONIN I: Troponin I: <0.03 ng/mL (ref <0.03)

## 2017-06-21 LAB — PROTIME-INR
INR: 1.06
PROTHROMBIN TIME: 13.7 s (ref 11.4–15.2)

## 2017-06-21 LAB — APTT: aPTT: 29 seconds (ref 24–36)

## 2017-06-21 MED ORDER — INSULIN ASPART 100 UNIT/ML ~~LOC~~ SOLN: 0.0000 [IU] | Freq: Every day | SUBCUTANEOUS | Status: DC

## 2017-06-21 MED ORDER — INSULIN ASPART 100 UNIT/ML ~~LOC~~ SOLN: 0.0000 [IU] | Freq: Three times a day (TID) | SUBCUTANEOUS | Status: DC

## 2017-06-21 MED ORDER — ATORVASTATIN CALCIUM 20 MG PO TABS
20.0000 mg | ORAL_TABLET | Freq: Every day | ORAL | Status: DC
  Administered 2017-06-21: 18:00:00 20 mg via ORAL
  Filled 2017-06-21: qty 1

## 2017-06-21 MED ORDER — SENNOSIDES-DOCUSATE SODIUM 8.6-50 MG PO TABS: 1.0000 | ORAL_TABLET | Freq: Every evening | ORAL | Status: DC | PRN

## 2017-06-21 MED ORDER — LEVOTHYROXINE SODIUM 75 MCG PO TABS
75.0000 ug | ORAL_TABLET | Freq: Every day | ORAL | Status: DC
  Administered 2017-06-22: 75 ug via ORAL
  Filled 2017-06-21: qty 1

## 2017-06-21 MED ORDER — CITALOPRAM HYDROBROMIDE 20 MG PO TABS
20.0000 mg | ORAL_TABLET | Freq: Every day | ORAL | Status: DC
  Administered 2017-06-22: 20 mg via ORAL
  Filled 2017-06-21: qty 1

## 2017-06-21 MED ORDER — ENOXAPARIN SODIUM 30 MG/0.3ML ~~LOC~~ SOLN
30.0000 mg | SUBCUTANEOUS | Status: DC
  Administered 2017-06-21: 21:00:00 30 mg via SUBCUTANEOUS
  Filled 2017-06-21: qty 0.3

## 2017-06-21 MED ORDER — ACETAMINOPHEN 160 MG/5ML PO SOLN
650.0000 mg | ORAL | Status: DC | PRN
  Filled 2017-06-21: qty 20.3

## 2017-06-21 MED ORDER — STROKE: EARLY STAGES OF RECOVERY BOOK
Freq: Once | Status: AC
  Administered 2017-06-21: 18:00:00

## 2017-06-21 MED ORDER — ACETAMINOPHEN 325 MG PO TABS: 650.0000 mg | ORAL_TABLET | ORAL | Status: DC | PRN

## 2017-06-21 MED ORDER — AMLODIPINE BESYLATE 5 MG PO TABS
5.0000 mg | ORAL_TABLET | ORAL | Status: DC
  Administered 2017-06-22: 09:00:00 5 mg via ORAL
  Filled 2017-06-21: qty 1

## 2017-06-21 MED ORDER — ACETAMINOPHEN 650 MG RE SUPP: 650.0000 mg | RECTAL | Status: DC | PRN

## 2017-06-21 MED ORDER — LEVETIRACETAM 500 MG PO TABS
500.0000 mg | ORAL_TABLET | Freq: Two times a day (BID) | ORAL | Status: DC
  Administered 2017-06-21 – 2017-06-22 (×2): 500 mg via ORAL
  Filled 2017-06-21 (×3): qty 1

## 2017-06-21 MED ORDER — ASPIRIN EC 325 MG PO TBEC
325.0000 mg | DELAYED_RELEASE_TABLET | Freq: Every day | ORAL | Status: DC
  Administered 2017-06-22: 09:00:00 325 mg via ORAL
  Filled 2017-06-21: qty 1

## 2017-06-21 NOTE — ED Provider Notes (Addendum)
[facility] Emergency Department Provider Note       Time seen: ----------------------------------------- 9:55 AM on 06/21/2017 -----------------------------------------     I have reviewed the triage vital signs and the nursing notes.   HISTORY   Chief Complaint Cerebrovascular Accident    HPI Vanessa [name] is a 83 y.o. female who presents to the ED for Left leg decreased sensation. Patient states she woke up around 4:30 AM with a coughing fit and felt fine at that time. Patient states when she woke up again at 7:30 she noticed she had decreased sensation to the left leg. She does report a history of CVA which affected her left side, particularly her left arm. She did have numbness in her left leg at that time. Left leg feels different female with decreased sensation. States she uses a walker normally, was able to ambulate prior to arrival.   Past Medical History:  Diagnosis Date  . Colon cancer ([place])   . Diabetes mellitus without complication ([place])   . Stroke ([place])   . Thyroid disorder     Patient Active Problem List   Diagnosis Date Noted  . TIA (transient ischemic attack) 06/23/2016  . Absolute anemia 05/30/2015  . B12 deficiency 05/30/2015  . Cerebral vascular accident ([place]) 05/30/2015  . Colon cancer ([place]) 05/30/2015  . Diabetes mellitus, type 2 ([place]) 05/30/2015  . HLD (hyperlipidemia) 05/30/2015  . Heart attack ([place]) 05/30/2015  . Adult hypothyroidism 05/30/2015  . Avitaminosis D 05/30/2015  . Enthesopathy of hip 01/05/2014  . Temporary cerebral vascular dysfunction 03/02/2012    Past Surgical History:  Procedure Laterality Date  . BACK SURGERY    . BREAST SURGERY    . COLON SURGERY    . TONSILLECTOMY      Allergies Benadryl [diphenhydramine hcl]; Codeine; Oxycodone; and Penicillins  Social History Social History  Substance Use Topics  . Smoking status: Never Smoker  . Smokeless tobacco: Never Used  . Alcohol use No    Review of Systems Constitutional: Negative for fever. Eyes: Negative for vision changes ENT:  Negative for congestion, sore throat Cardiovascular: Negative for chest pain. Respiratory: Negative for shortness of breath. Gastrointestinal: Negative for abdominal pain, vomiting and diarrhea. Genitourinary: Negative for dysuria. Musculoskeletal: Negative for back pain. Skin: Negative for rash. Neurological: positive for numbness in the left leg  All systems negative/normal/unremarkable except as stated in the HPI  ____________________________________________   PHYSICAL EXAM:  VITAL SIGNS: ED Triage Vitals [06/21/17 0944]  Enc Vitals Group     BP (!) 125/51     Pulse Rate 66     Resp 17     Temp 97.7 F (36.5 C)     Temp Source Oral     SpO2 100 %     Weight 150 lb (68 kg)     Height 5\' 5"  (1.651 m)     Head Circumference      Peak Flow      Pain Score      Pain Loc      Pain Edu?      Excl. in [place]?    Constitutional: Alert and oriented. Well appearing and in no distress. Eyes: Conjunctivae are normal. Normal extraocular movements. ENT   Head: Normocephalic and atraumatic.   Nose: No congestion/rhinnorhea.   Mouth/Throat: Mucous membranes are moist.   Neck: No stridor. Cardiovascular: Normal rate, regular rhythm. No murmurs, rubs, or gallops. Respiratory: Normal respiratory effort without tachypnea nor retractions. Breath sounds are clear and equal  bilaterally. No wheezes/rales/rhonchi. Gastrointestinal: Soft and nontender. Normal bowel sounds Musculoskeletal: Nontender with normal range of motion in extremities. No lower extremity tenderness nor edema. Neurologic:  Normal speech and language. decreased sensation in the left arm and leg compared to right. Strength appears normal in the lower extremities, there is previous deficit in left upper extremity. She is able to ambulate. Skin:  Skin is warm, dry and intact. No rash noted. Psychiatric: Mood and  affect are normal. Speech and behavior are normal.  ____________________________________________  EKG: Interpreted by me.sinus rhythm rate of 60 bpm, normal PR interval, wide QRS, normal QT,right bundle branch block  ____________________________________________  ED COURSE:  Pertinent labs & imaging results that were available during my care of the patient were reviewed by me and considered in my medical decision making (see chart for details). Patient presents for acute left lower leg weakness, we will assess with labs and imaging as indicated.   Procedures ____________________________________________   LABS (pertinent positives/negatives)  Labs Reviewed  COMPREHENSIVE METABOLIC PANEL - Abnormal; Notable for the following:       Result Value   Glucose, Bld 110 (*)    BUN 21 (*)    Creatinine, Ser 1.12 (*)    ALT 8 (*)    Total Bilirubin 2.0 (*)    GFR calc non Af Amer 44 (*)    GFR calc Af Amer 51 (*)    All other components within normal limits  PROTIME-INR  APTT  TROPONIN I  ETHANOL  CBC  DIFFERENTIAL  URINE DRUG SCREEN, QUALITATIVE ([facility] ONLY)  URINALYSIS, ROUTINE W REFLEX MICROSCOPIC    RADIOLOGY Images were viewed by me  CT head  IMPRESSION: 1. No acute intracranial abnormality. Atrophy and chronic basal ganglia infarct on the right. 2. ASPECTS is 10 ____________________________________________  FINAL ASSESSMENT AND PLAN  left-sided paresthesias, possible CVA   Plan: Patient's labs and imaging were dictated above. Patient had presented for left-sided decreased sensation which may be stroke related but does not meet criteria for large vessel occlusion or TPA. Last known well was around 4:30 AM. Otherwise she is at her baseline neurologically. She's been evaluated by neurology and will need to be admitted with full stroke workup.   Earleen Newport, MD   Note: This note was generated in part or whole with voice recognition software. Voice  recognition is usually quite accurate but there are transcription errors that can and very often do occur. I apologize for any typographical errors that were not detected and corrected.     Earleen Newport, MD 06/21/17 1039    Earleen Newport, MD 06/21/17 [phone]

## 2017-06-21 NOTE — Progress Notes (Signed)
*  PRELIMINARY RESULTS* Echocardiogram 2D Echocardiogram has been performed.  Vanessa [name] 06/21/2017, 2:09 PM

## 2017-06-21 NOTE — H&P (Signed)
[facility] NAME: Vanessa [name]    MR#:  [id number]  DATE OF BIRTH:  26-May-1931  DATE OF ADMISSION:  06/21/2017  PRIMARY CARE PHYSICIAN: Herminio Commons, MD   REQUESTING/REFERRING PHYSICIAN: Dr. Jimmye Norman  CHIEF COMPLAINT:  Left upper extremity weakness and decreased sensation from 7:30 AM  HISTORY OF PRESENT ILLNESS:  Vanessa [name]  is a 83 y.o. female with a known history of Stroke, post residual left leg weakness and numbness, diet-controlled diabetes mellitus, hypertension, hyperlipidemia is presenting to the ED with a chief complaint of left upper extremity weakness and decreased sensation from 7:30 AM today. CT head is negative. ED physician has discussed with Dr. Doy [name] and patient Vanessa [name] in the ED and hospitalist team is called to admit the patient for stroke workup. Patient reports mild headache but denies any blurry vision. Denies any speech difficulty. Passed bedside swallow evaluation in the emergency department. Resting comfortably. Patient ambulates with assistance/walker at her baseline. Husband at bedside PAST MEDICAL HISTORY:   Past Medical History:  Diagnosis Date  . Colon cancer ([place])   . Diabetes mellitus without complication ([place])   . Stroke ([place])   . Thyroid disorder     PAST SURGICAL HISTOIRY:   Past Surgical History:  Procedure Laterality Date  . BACK SURGERY    . BREAST SURGERY    . COLON SURGERY    . TONSILLECTOMY      SOCIAL HISTORY:   Social History  Substance Use Topics  . Smoking status: Never Smoker  . Smokeless tobacco: Never Used  . Alcohol use No    FAMILY HISTORY:  DIABETES MELLITUS  DRUG ALLERGIES:   Allergies  Allergen Reactions  . Benadryl [Diphenhydramine Hcl] Other (See Comments)    Pt states that it makes her legs jump.   . Codeine Nausea And Vomiting  . Oxycodone Nausea And Vomiting and Other (See Comments)    "Makes me real irritable and I feel terrible."   . Penicillins Other (See Comments)    Pt states that it does not work for her.   Has patient had a PCN reaction causing immediate rash, facial/tongue/throat swelling, SOB or lightheadedness with hypotension: No Has patient had a PCN reaction causing severe rash involving mucus membranes or skin necrosis: No Has patient had a PCN reaction that required hospitalization: No Has patient had a PCN reaction occurring within the last 10 years: No If all of the above answers are "NO", then may proceed with Cephalosporin use.     REVIEW OF SYSTEMS:  CONSTITUTIONAL: No fever, fatigue or weakness.  EYES: No blurred or double vision.  EARS, NOSE, AND THROAT: No tinnitus or ear pain.  RESPIRATORY: No cough, shortness of breath, wheezing or hemoptysis.  CARDIOVASCULAR: No chest pain, orthopnea, edema.  GASTROINTESTINAL: No nausea, vomiting, diarrhea or abdominal pain.  GENITOURINARY: No dysuria, hematuria.  ENDOCRINE: No polyuria, nocturia,  HEMATOLOGY: No anemia, easy bruising or bleeding SKIN: No rash or lesion. MUSCULOSKELETAL: Reporting left upper extremity weakness and decreased sensation has chronic left lower extremity weakness and numbness from previous stroke    NEUROLOGIC: No tingling, numbness, weakness.  PSYCHIATRY: No anxiety or depression.   MEDICATIONS AT HOME:   Prior to Admission medications   Medication Sig Start Date End Date Taking? Authorizing Provider  amLODipine (NORVASC) 5 MG tablet Take 5 mg by mouth every morning.    Yes [provider]  aspirin 325 MG tablet Take 1 tablet (325 mg  total) by mouth daily. 06/25/16  Yes Dustin Flock, MD  citalopram (CELEXA) 20 MG tablet Take 20 mg by mouth daily.   Yes [provider]  levETIRAcetam (KEPPRA) 500 MG tablet Take 1 tablet (500 mg total) by mouth 2 (two) times daily. 06/24/16  Yes Dustin Flock, MD  levothyroxine (SYNTHROID, LEVOTHROID) 75 MCG tablet Take 75 mcg by mouth daily before breakfast.   Yes  [provider]  atorvastatin (LIPITOR) 20 MG tablet Take 1 tablet (20 mg total) by mouth daily at 6 PM. Patient not taking: Reported on 06/21/2017 06/24/16   Dustin Flock, MD      VITAL SIGNS:  Blood pressure (!) 117/52, pulse (!) 58, temperature 97.7 F (36.5 C), temperature source Oral, resp. rate 18, height 5\' 5"  (1.651 m), weight 68 kg (150 lb), SpO2 (!) 88 %.  PHYSICAL EXAMINATION:  GENERAL:  83 y.o.-year-old patient lying in the bed with no acute distress.  EYES: Pupils equal, round, reactive to light and accommodation. No scleral icterus. Extraocular muscles intact.  HEENT: Head atraumatic, normocephalic. Oropharynx and nasopharynx clear.  NECK:  Supple, no jugular venous distention. No thyroid enlargement, no tenderness.  LUNGS: Normal breath sounds bilaterally, no wheezing, rales,rhonchi or crepitation. No use of accessory muscles of respiration.  CARDIOVASCULAR: S1, S2 normal. No murmurs, rubs, or gallops.  ABDOMEN: Soft, nontender, nondistended. Bowel sounds present. No organomegaly or mass.  EXTREMITIES: No pedal edema, cyanosis, or clubbing.  NEUROLOGIC: Cranial nerves II through XII are intact. Left upper extremity motor is 3 out of 5 with decreased sensation. Left lower extremity 2 out of 5. Right lower extremity 3 out of 5 n right upper extremity 4 out of 5  Sensation intact in all extremities except left upper and lower extremity with decreased at sensation. Gait not checked.  PSYCHIATRIC: The patient is alert and oriented x 3.  SKIN: No obvious rash, lesion, or ulcer.   LABORATORY PANEL:   CBC  Recent Labs Lab 06/21/17 0946  WBC 5.8  HGB 12.5  HCT 33.9*  PLT 214   ------------------------------------------------------------------------------------------------------------------  Chemistries   Recent Labs Lab 06/21/17 0946  NA 139  K 4.1  CL 107  CO2 25  GLUCOSE 110*  BUN 21*  CREATININE 1.12*  CALCIUM 9.1  AST 20  ALT 8*  ALKPHOS 58   BILITOT 2.0*   ------------------------------------------------------------------------------------------------------------------  Cardiac Enzymes  Recent Labs Lab 06/21/17 0946  TROPONINI <0.03   ------------------------------------------------------------------------------------------------------------------  RADIOLOGY:  Ct Head Code Stroke Wo Contrast  Result Date: 06/21/2017 CLINICAL DATA:  Code stroke. Focal neuro deficit less than 6 hours, stroke suspected. Left-sided weakness EXAM: CT HEAD WITHOUT CONTRAST TECHNIQUE: Contiguous axial images were obtained from the base of the skull through the vertex without intravenous contrast. COMPARISON:  CT head 03/11/2017 FINDINGS: Brain: Moderate atrophy. Chronic infarct right basal ganglia unchanged. Negative for acute infarct, hemorrhage, mass lesion. No change from the prior CT Vascular: Negative for hyperdense vessel Skull: Negative Sinuses/Orbits: Negative Other: None ASPECTS ([place] Stroke Program Early CT Score) - Ganglionic level infarction (caudate, lentiform nuclei, internal capsule, insula, M1-M3 cortex): 7 - Supraganglionic infarction (M4-M6 cortex): 3 Total score (0-10 with 10 being normal): 10 IMPRESSION: 1. No acute intracranial abnormality. Atrophy and chronic basal ganglia infarct on the right. 2. ASPECTS is 10 These results were called by telephone at the time of interpretation on 06/21/2017 at 10:10 am to Dr. Lenise Arena , who verbally acknowledged these results. Electronically Signed   By: Franchot Gallo M.D.  On: 06/21/2017 10:10    EKG:   Orders placed or performed during the hospital encounter of 06/21/17  . EKG 12-Lead  . EKG 12-Lead  . ED EKG  . ED EKG    IMPRESSION AND PLAN:  Vanessa [name]  is a 83 y.o. female with a known history of Stroke, post residual left leg weakness and numbness, diet-controlled diabetes mellitus, hypertension, hyperlipidemia is presenting to the ED with a chief complaint of left upper  extremity weakness and decreased sensation from 7:30 AM today. CT head is negative. ED physician has discussed with Dr. Doy [name] and patient Vanessa [name] in the ED and hospitalist team is called to admit the patient for stroke workup  # TIA/CVA-new onset left upper extremity numbness and weakness Admit to MedSurg unit CT head is negative We'll get complete stroke workup with MRI/MRA of the brain, carotid Dopplers and 2-D echocardiogram Patient has passed bedside swallow evaluation Neuro checks and neurology consult Check TSH, fasting lipid panel and hemoglobin A1c Aspirin 325 mg PT and OT evaluation  #History of stroke with left lower extremity weakness Continue aspirin and Lipitor  #Diabetes mellitus type consult check hemoglobin A1c and sliding scale insulin  #Hypothyroidism continue Synthroid  #Hyperlipidemia continue statin and check fasting lipid panel   All the records are reviewed and case discussed with ED provider. Management plans discussed with the patient, family and they are in agreement.  CODE STATUS: fc Christel Mormon HCPOA   TOTAL TIME TAKING CARE OF THIS PATIENT: 45  minutes.   Note: This dictation was prepared with Dragon dictation along with smaller phrase technology. Any transcriptional errors that result from this process are unintentional.  Nicholes Mango M.D on 06/21/2017 at 11:36 AM  Between 7am to 6pm - Pager - [phone]  After 6pm go to [email or web address] - password [email or web address] [facility]  Office  [phone]  CC: Primary care physician; Herminio Commons, MD

## 2017-06-21 NOTE — Progress Notes (Signed)
Chaplain responded to a [employer] for pt in [place]. When [facility] arrived the medical team was attending to pt. Pt alert and talking. Pt talked about her family, her children, her grandchildren and great gradchildren, her husband struggles with alzheimer's. Pt asked for prayers, which [place] offered with a ministry of presence. No family member was present but pt stated that her daughter was on her way to hospital. [place] to follow up with pt as needed.   06/21/17 1100  Clinical Encounter Type  Visited With Patient;Health care provider  Visit Type Initial;Follow-up;Spiritual support;Code  Referral From Nurse  Consult/Referral To Chaplain  Spiritual Encounters  Spiritual Needs Prayer;Other (Comment)

## 2017-06-21 NOTE — ED Triage Notes (Signed)
Pt states she woke up around 430am with a coughing fit and felt fine. States when she woke again at 730am she noticed numbness to the left LE.Marland Kitchen Pt was ambulatory to the stretcher at the home , states she uses a walker normally. Pt is a/ox3.Marland Kitchen
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
## 2017-06-21 NOTE — Consult Note (Signed)
Referring Physician: Gouru    Chief Complaint: Left sided numbness  HPI: Vanessa [name] is an 83 y.o. female with a history of stroke in the past on ASA who reports that she awakened at 730 this morning and noted that her left leg was numb.  With no improvement in symptoms presented for evaluation at that time.  Initial NIHSS of 6.   At baseline patient uses a walker.  Has a residual left hemiparesis.  Helps care for her demented husband at home.   Date last known well: Date: 06/21/2017 Time last known well: Time: 04:00 tPA Given: No: Outside time window  Past Medical History:  Diagnosis Date  . Colon cancer ([place])   . Diabetes mellitus without complication ([place])   . Stroke ([place])   . Thyroid disorder     Past Surgical History:  Procedure Laterality Date  . BACK SURGERY    . BREAST SURGERY    . COLON SURGERY    . TONSILLECTOMY      Family history:  Mother deceased from breast cancer.  Father with CAD deceased from MI.  Brother with DM  Social History:  reports that she has never smoked. She has never used smokeless tobacco. She reports that she does not drink alcohol or use drugs.  Allergies:  Allergies  Allergen Reactions  . Benadryl [Diphenhydramine Hcl] Other (See Comments)    Pt states that it makes her legs jump.   . Codeine Nausea And Vomiting  . Oxycodone Nausea And Vomiting and Other (See Comments)    "Makes me real irritable and I feel terrible."  . Penicillins Other (See Comments)    Pt states that it does not work for her.   Has patient had a PCN reaction causing immediate rash, facial/tongue/throat swelling, SOB or lightheadedness with hypotension: No Has patient had a PCN reaction causing severe rash involving mucus membranes or skin necrosis: No Has patient had a PCN reaction that required hospitalization: No Has patient had a PCN reaction occurring within the last 10 years: No If all of the above answers are "NO", then may proceed with Cephalosporin use.      Medications:  I have reviewed the patient's current medications. Prior to Admission:  Prescriptions Prior to Admission  Medication Sig Dispense Refill Last Dose  . amLODipine (NORVASC) 5 MG tablet Take 5 mg by mouth every morning.    06/21/2017 at 0800  . aspirin 325 MG tablet Take 1 tablet (325 mg total) by mouth daily.   06/21/2017 at Unknown time  . citalopram (CELEXA) 20 MG tablet Take 20 mg by mouth daily.   06/21/2017 at 0800  . levETIRAcetam (KEPPRA) 500 MG tablet Take 1 tablet (500 mg total) by mouth 2 (two) times daily. 60 tablet 2 06/21/2017 at 0800  . levothyroxine (SYNTHROID, LEVOTHROID) 75 MCG tablet Take 75 mcg by mouth daily before breakfast.   06/21/2017 at 0730  . atorvastatin (LIPITOR) 20 MG tablet Take 1 tablet (20 mg total) by mouth daily at 6 PM. (Patient not taking: Reported on 06/21/2017) 60 tablet 0 Not Taking at Unknown time   Scheduled: .  stroke: mapping our early stages of recovery book   Does not apply Once  . [START ON 06/22/2017] amLODipine  5 mg Oral [id number]  . [START ON 06/22/2017] aspirin EC  325 mg Oral Daily  . atorvastatin  20 mg Oral q1800  . [START ON 06/22/2017] citalopram  20 mg Oral Daily  . enoxaparin (LOVENOX) injection  30  mg Subcutaneous Q24H  . insulin aspart  0-5 Units Subcutaneous QHS  . insulin aspart  0-9 Units Subcutaneous TID WC  . levETIRAcetam  500 mg Oral BID  . [START ON 06/22/2017] levothyroxine  75 mcg Oral QAC breakfast    ROS: History obtained from the patient  General ROS: negative for - chills, fatigue, fever, night sweats, weight gain or weight loss Psychological ROS: negative for - behavioral disorder, hallucinations, memory difficulties, mood swings or suicidal ideation Ophthalmic ROS: progressively poor vision ENT ROS: negative for - epistaxis, nasal discharge, oral lesions, sore throat, tinnitus or vertigo Allergy and Immunology ROS: negative for - hives or itchy/watery eyes Hematological and Lymphatic ROS: negative for -  bleeding problems, bruising or swollen lymph nodes Endocrine ROS: negative for - galactorrhea, hair pattern changes, polydipsia/polyuria or temperature intolerance Respiratory ROS: cough, shortness of breath Cardiovascular ROS: negative for - chest pain, dyspnea on exertion, edema or irregular heartbeat Gastrointestinal ROS: negative for - abdominal pain, diarrhea, hematemesis, nausea/vomiting or stool incontinence Genito-Urinary ROS: negative for - dysuria, hematuria, incontinence or urinary frequency/urgency Musculoskeletal ROS: negative for - joint swelling or muscular weakness Neurological ROS: as noted in HPI Dermatological ROS: negative for rash and skin lesion changes  Physical Examination: Blood pressure (!) 125/51, pulse 66, temperature 97.7 F (36.5 C), temperature source Oral, resp. rate 17, height 5\' 5"  (1.651 m), weight 68 kg (150 lb), SpO2 100 %.  HEENT-  Normocephalic, no lesions, without obvious abnormality.  Normal external eye and conjunctiva.  Normal TM's bilaterally.  Normal auditory canals and external ears. Normal external nose, mucus membranes and septum.  Normal pharynx. Cardiovascular- S1, S2 normal, pulses palpable throughout   Lungs- chest clear, no wheezing, rales, normal symmetric air entry Abdomen- soft, non-tender; bowel sounds normal; no masses,  no organomegaly Extremities- no edema Lymph-no adenopathy palpable Musculoskeletal-no joint tenderness, deformity or swelling Skin-warm and dry, no hyperpigmentation, vitiligo, or suspicious lesions  Neurological Examination   Mental Status: Alert, oriented, thought content appropriate.  Speech fluent without evidence of aphasia.  Mild dysarthria.  Able to follow 3 step commands without difficulty. Cranial Nerves: II: Discs flat bilaterally; Visual fields grossly normal, pupils equal, round, reactive to light and accommodation III,IV, VI: ptosis not present, extra-ocular motions intact bilaterally V,VII: mild left  facial droop, facial light touch sensation decreased on the left VIII: hearing normal bilaterally IX,X: gag reflex present XI: bilateral shoulder shrug XII: midline tongue extension Motor: Right : Upper extremity   5/5    Left:     Upper extremity   5-/5  Lower extremity   5-/5     Lower extremity   3/5 Increased tone in the LUE with fisting noted Sensory: Pinprick and light touch decreased in the LUE and LLE Deep Tendon Reflexes: 2+ and symmetric with absent AJ's bilaterally Plantars: Right: upgoing   Left: upgoing Cerebellar: Normal finger-to-nose and normal heel-to-shin testing bilaterally Gait: not tested due to safety concerns    Laboratory Studies:  Basic Metabolic Panel: No results for input(s): NA, K, CL, CO2, GLUCOSE, BUN, CREATININE, CALCIUM, MG, PHOS in the last 168 hours.  Liver Function Tests: No results for input(s): AST, ALT, ALKPHOS, BILITOT, PROT, ALBUMIN in the last 168 hours. No results for input(s): LIPASE, AMYLASE in the last 168 hours. No results for input(s): AMMONIA in the last 168 hours.  CBC: No results for input(s): WBC, NEUTROABS, HGB, HCT, MCV, PLT in the last 168 hours.  Cardiac Enzymes: No results for input(s): CKTOTAL, CKMB, CKMBINDEX,  TROPONINI in the last 168 hours.  BNP: Invalid input(s): POCBNP  CBG: No results for input(s): GLUCAP in the last 168 hours.  Microbiology: Results for orders placed or performed during the hospital encounter of 03/11/17  Urine culture     Status: None   Collection Time: 03/11/17  8:51 AM  Result Value Ref Range Status   Specimen Description URINE, RANDOM  Final   Special Requests NONE  Final   Culture   Final    NO GROWTH Performed at [facility], [place] [address] [place], [place] [address]    Report Status 03/12/2017 FINAL  Final    Coagulation Studies: No results for input(s): LABPROT, INR in the last 72 hours.  Urinalysis: No results for input(s): COLORURINE, LABSPEC, PHURINE, GLUCOSEU,  HGBUR, BILIRUBINUR, KETONESUR, PROTEINUR, UROBILINOGEN, NITRITE, LEUKOCYTESUR in the last 168 hours.  Invalid input(s): APPERANCEUR  Lipid Panel:    Component Value Date/Time   CHOL 199 06/24/2016 0554   CHOL 188 03/12/2012 0456   TRIG 85 06/24/2016 0554   TRIG 145 03/12/2012 0456   HDL 51 06/24/2016 0554   HDL 41 03/12/2012 0456   CHOLHDL 3.9 06/24/2016 0554   VLDL 17 06/24/2016 0554   VLDL 29 03/12/2012 0456   LDLCALC 131 (H) 06/24/2016 0554   LDLCALC 118 (H) 03/12/2012 0456    HgbA1C:  Lab Results  Component Value Date   HGBA1C <4.2 (L) 06/24/2016    Urine Drug Screen:  No results found for: LABOPIA, COCAINSCRNUR, LABBENZ, AMPHETMU, THCU, LABBARB  Alcohol Level: No results for input(s): ETH in the last 168 hours.  Other results: EKG: sinus rhythm at 68 bpm.  Imaging: Ct Head Code Stroke Wo Contrast  Result Date: 06/21/2017 CLINICAL DATA:  Code stroke. Focal neuro deficit less than 6 hours, stroke suspected. Left-sided weakness EXAM: CT HEAD WITHOUT CONTRAST TECHNIQUE: Contiguous axial images were obtained from the base of the skull through the vertex without intravenous contrast. COMPARISON:  CT head 03/11/2017 FINDINGS: Brain: Moderate atrophy. Chronic infarct right basal ganglia unchanged. Negative for acute infarct, hemorrhage, mass lesion. No change from the prior CT Vascular: Negative for hyperdense vessel Skull: Negative Sinuses/Orbits: Negative Other: None ASPECTS ([place] Stroke Program Early CT Score) - Ganglionic level infarction (caudate, lentiform nuclei, internal capsule, insula, M1-M3 cortex): 7 - Supraganglionic infarction (M4-M6 cortex): 3 Total score (0-10 with 10 being normal): 10 IMPRESSION: 1. No acute intracranial abnormality. Atrophy and chronic basal ganglia infarct on the right. 2. ASPECTS is 10 These results were called by telephone at the time of interpretation on 06/21/2017 at 10:10 am to Dr. Lenise Arena , who verbally acknowledged these results.  Electronically Signed   By: Franchot Gallo M.D.   On: 06/21/2017 10:10    Assessment: 83 y.o. female with a history of stroke and residual left hemiparesis who presents with new onset left sided numbness.  On ASA at home.  Head CT performed and shows no acute changes.  Suspect acute infarct secondary to small vessel disease.  Further work up recommended.    Stroke Risk Factors - diabetes mellitus  Plan: 1. HgbA1c, fasting lipid panel 2. MRI, MRA  of the brain without contrast 3. PT consult, OT consult, Speech consult 4. Echocardiogram 5. Carotid dopplers 6. Prophylactic therapy-Antiplatelet med: Aspirin - dose 325mg  daily 7. NPO until RN stroke swallow screen 8. Telemetry monitoring 9. Frequent neuro checks   Alexis Goodell, MD Neurology [phone] 06/21/2017, 10:22 AM

## 2017-06-21 NOTE — Evaluation (Signed)
Physical Therapy Evaluation Patient Details Name: Vanessa [name] MRN: [id number] DOB: 04/15/1931 Today's Date: 06/21/2017   History of Present Illness  presented to ER secondary to worsening L UE > LE weakness, numbness; admitted for acute hospitalization for TIA/CVA work-up.  Head CT negative; MRI pending.  Clinical Impression  Upon evaluation, patient alert and oriented to basic information; generally hyperverbal, requiring consistent redirection for focus/attention to task.  Baseline L UE > LE hemiparesis due to previous CVA; however, reports decreased function and sensation (UE > LE) compared to baseline residual. L UE in fixed, fisted position, except L thumb and first finger, but able to maintain position on RW for stability with all mobility efforts.  Demonstrates ability to complete bed mobility (transition towards R) with close sup; sit/stand, basic transfers and gait (25') with RW, cga/min assist. Noted 'drag' of L LE throughout gait cycle, maintaining in position of ER with decreased heel strike/toe off at all times.  Likely to place patient at increased fall risk, especially with increase in LE fatigue. Would benefit from skilled PT to address above deficits and promote optimal return to PLOF; Recommend transition to [facility] upon discharge from acute hospitalization.     Follow Up Recommendations Home health PT    Equipment Recommendations   (has RW in home environment)    Recommendations for Other Services       Precautions / Restrictions Precautions Precautions: Fall Restrictions Weight Bearing Restrictions: No      Mobility  Bed Mobility Overal bed mobility: Needs Assistance Bed Mobility: Supine to Sit     Supine to sit: Supervision     General bed mobility comments: transition towards R, use of bedrail to assist with truncal elevation (limited ability to L UE to assist with movement transition)  Transfers Overall transfer level: Needs assistance Equipment used:  Rolling walker (2 wheeled) Transfers: Sit to/from Stand Sit to Stand: Min guard         General transfer comment: uses posterior surface of bilat LEs against edge of bed for external stabilization  Ambulation/Gait Ambulation/Gait assistance: Min guard Ambulation Distance (Feet): 25 Feet Assistive device: Rolling walker (2 wheeled)       General Gait Details: decreased cadence wtih partial step to gait pattern (leading with R LE); noted 'drag' of L LE with decreased step height/length, excessive ER throughout gait cycle. Unable to maintain full palmar grasp on RW due to fixed hand position, but maintains control of RW with L thumb/index finger  Stairs            Wheelchair Mobility    Modified Rankin (Stroke Patients Only)       Balance Overall balance assessment: Needs assistance Sitting-balance support: No upper extremity supported;Feet supported Sitting balance-Leahy Scale: Good     Standing balance support: Bilateral upper extremity supported Standing balance-Leahy Scale: Fair                               Pertinent Vitals/Pain Pain Assessment: 0-10 Pain Score: 6  Pain Location: R posterior/lateral cervical area Pain Descriptors / Indicators: Aching Pain Intervention(s): Limited activity within patient's tolerance;Monitored during session;Repositioned    Home Living Family/patient expects to be discharged to:: Private residence Living Arrangements: Spouse/significant other Available Help at Discharge: Family;Personal care attendant Type of Home: House Home Access: Ramped entrance     Home Layout: One level Home Equipment: Environmental consultant - 2 wheels;Shower seat;Grab bars - tub/shower Additional Comments: Has CNA to  assist with ADLs, household chores and community needs/errands 3 days/week, 5 hours/day    Prior Function Level of Independence: Needs assistance         Comments: Mod indep for basic, household distances with RW; assist from CNA for  ADLs and community needs.  Does endorse multiple fall history.     Hand Dominance   Dominant Hand: Right    Extremity/Trunk Assessment   Upper Extremity Assessment Upper Extremity Assessment:  (L shoulder 3-/5, elbow 4-/5, wrist 3-/5 with hand fixed in fisted position (except thumb and index finger).  Decrease sensory awareness throughout, distal > proximal, with noted extinction.)    Lower Extremity Assessment Lower Extremity Assessment:  (L LE grossly at least 3-/5; decreased sensory awareness throughout, but localization intact, no extinction.)       Communication   Communication: No difficulties  Cognition Arousal/Alertness: Awake/alert Behavior During Therapy: WFL for tasks assessed/performed Overall Cognitive Status: Within Functional Limits for tasks assessed                                 General Comments: very hyperverbal, requiring frequent redirection to task      General Comments      Exercises Other Exercises Other Exercises: Reviewed positioning, stretching and hygiene of L UE due to contracted position;  patient voiced understanding   Assessment/Plan    PT Assessment Patient needs continued PT services  PT Problem List Decreased strength;Decreased activity tolerance;Decreased range of motion;Decreased mobility;Decreased coordination;Decreased cognition;Decreased balance;Decreased knowledge of use of DME;Decreased safety awareness;Decreased knowledge of precautions       PT Treatment Interventions DME instruction;Gait training;Functional mobility training;Therapeutic activities;Therapeutic exercise;Balance training;Patient/family education;Neuromuscular re-education    PT Goals (Current goals can be found in the Care Plan section)  Acute Rehab PT Goals Patient Stated Goal: to return home PT Goal Formulation: With patient Time For Goal Achievement: 07/05/17 Potential to Achieve Goals: Fair    Frequency Min 2X/week   Barriers to  discharge Decreased caregiver support      Co-evaluation               AM-PAC PT "6 Clicks" Daily Activity  Outcome Measure Difficulty turning over in bed (including adjusting bedclothes, sheets and blankets)?: A Little Difficulty moving from lying on back to sitting on the side of the bed? : A Little Difficulty sitting down on and standing up from a chair with arms (e.g., wheelchair, bedside commode, etc,.)?: Unable Help needed moving to and from a bed to chair (including a wheelchair)?: A Little Help needed walking in hospital room?: A Little Help needed climbing 3-5 steps with a railing? : A Lot 6 Click Score: 15    End of Session Equipment Utilized During Treatment: Gait belt Activity Tolerance: Patient tolerated treatment well Patient left: with call bell/phone within reach;in chair;with chair alarm set (RNCM in room for patient assessment)   PT Visit Diagnosis: Difficulty in walking, not elsewhere classified (R26.2);Muscle weakness (generalized) (M62.81)    Time: 9326-7124 PT Time Calculation (min) (ACUTE ONLY): 35 min   Charges:   PT Evaluation $PT Eval Low Complexity: 1 Low PT Treatments $Therapeutic Activity: 8-22 mins   PT G Codes:   PT G-Codes **NOT FOR INPATIENT CLASS** Functional Assessment Tool Used: AM-PAC 6 Clicks Basic Mobility Functional Limitation: Mobility: Walking and moving around Mobility: Walking and Moving Around Current Status (P8099): At least 20 percent but less than 40 percent impaired, limited or restricted Mobility:  Walking and Moving Around Goal Status [phone]): At least 1 percent but less than 20 percent impaired, limited or restricted   Jamica Woodyard H. Owens Shark, PT, DPT, NCS 06/21/17, 9:42 PM [phone]

## 2017-06-21 NOTE — ED Notes (Signed)
CODE  STROKE  CALLED  TO [place] MD

## 2017-06-21 NOTE — Care Management Obs Status (Signed)
[place] NOTIFICATION   Patient Details  Name: Vanessa [name] MRN: [id number] Date of Birth: 12/02/1930   Medicare Observation Status Notification Given:  Yes Patient declined to sign; copy left with patient for review.   Marshell Garfinkel, RN 06/21/2017, 3:58 PM

## 2017-06-22 DIAGNOSIS — G459 Transient cerebral ischemic attack, unspecified: Secondary | ICD-10-CM | POA: Diagnosis not present

## 2017-06-22 LAB — LIPID PANEL
Cholesterol: 182 mg/dL (ref 0–200)
HDL: 44 mg/dL (ref >40)
LDL CALC: 117 mg/dL — AB (ref 0–99)
TRIGLYCERIDES: 103 mg/dL (ref <150)
Total CHOL/HDL Ratio: 4.1 RATIO
VLDL: 21 mg/dL (ref 0–40)

## 2017-06-22 LAB — GLUCOSE, CAPILLARY
Glucose-Capillary: 106 mg/dL — ABNORMAL HIGH (ref 65–99)
Glucose-Capillary: 118 mg/dL — ABNORMAL HIGH (ref 65–99)

## 2017-06-22 LAB — ECHOCARDIOGRAM COMPLETE
Height: 65 in
WEIGHTICAEL: 2400 [oz_av]

## 2017-06-22 LAB — TSH: TSH: 5.677 u[IU]/mL — ABNORMAL HIGH (ref 0.350–4.500)

## 2017-06-22 MED ORDER — ENOXAPARIN SODIUM 40 MG/0.4ML ~~LOC~~ SOLN: 40.0000 mg | SUBCUTANEOUS | Status: DC

## 2017-06-22 MED ORDER — ATORVASTATIN CALCIUM 40 MG PO TABS: 40.0000 mg | ORAL_TABLET | Freq: Every day | ORAL | 11 refills | Status: AC

## 2017-06-22 NOTE — Progress Notes (Signed)
Discussed discharge instructions and medications with patient. IV removed. All questions addressed. Patient transported home via car by her husband.  Clarise Cruz, RN

## 2017-06-22 NOTE — Discharge Summary (Signed)
Vanessa [name], 83 y.o., DOB May 18, 1931, MRN [id number]. Admission date: 06/21/2017 Discharge Date 06/22/2017 Primary MD Soles, Howell Rucks, MD Admitting Physician Nicholes Mango, MD  Admission Diagnosis  Paresthesia of left upper and lower extremity [R20.2]  Discharge Diagnosis   Active Problems:   TIA (transient ischemic attack)   Previous history of CVA   Diabetes type 2   Hypothyroidism   Hyperlipidemia History of colon cancer          Hospital Course  Vanessa [name]  is a 83 y.o. female with a known history of Stroke, post residual left leg weakness and numbness, diet-controlled diabetes mellitus, hypertension, hyperlipidemia is presenting to the ED with a chief complaint of left upper extremity weakness and decreased sensation from 7:30 AM YESTERDAY. CT head is negative. ED physician has discussed with Dr. Doy Mince and patient Doy Mince in the ED and patient was admitted. She underwent MRI of the brain which showed no significant changes compared to previous MRI. Patient's symptoms are significantly improved. She was seen in consultation by neurology who recommended carotid Dopplers. Which showed no significant stenosis. Echocardiogram without any evidence of thrombus. Due to her previous history of GI bleed she was recommended to continue aspirin. Her cholesterol medication was increased. Patient was seen by physical therapy who recommended home PT.           Consults  neurology  Significant Tests:  See full reports for all details    Mr Brain Wo Contrast  Result Date: 06/21/2017 CLINICAL DATA:  Stroke.  Left-sided weakness. EXAM: MRI HEAD WITHOUT CONTRAST MRA HEAD WITHOUT CONTRAST TECHNIQUE: Multiplanar, multiecho pulse sequences of the brain and surrounding structures were obtained without intravenous contrast. Angiographic images of the head were obtained using MRA technique without contrast. COMPARISON:  Head CT 06/21/2017  MR brain 06/23/2016 FINDINGS: MRI HEAD FINDINGS Brain: The midline structures are normal. No focal diffusion restriction to indicate acute infarct. No intraparenchymal hemorrhage. There is an old right corona radiata lacunar infarct with associated hemosiderin deposition. Meningioma projecting to the left from the posterior falx cerebri measures 1.8 cm, unchanged. Minimal adjacent edema is also unchanged. No chronic microhemorrhage or cerebral amyloid angiopathy. No hydrocephalus, age advanced atrophy or lobar predominant volume loss. No dural abnormality or extra-axial collection. Skull and upper cervical spine: The visualized skull base, calvarium, upper cervical spine and extracranial soft tissues are normal. Sinuses/Orbits: No fluid levels or advanced mucosal thickening. No mastoid effusion. Normal orbits. MRA HEAD FINDINGS Intracranial internal carotid arteries: Normal Anterior cerebral arteries: Normal. Middle cerebral arteries: Normal. Posterior communicating arteries: Absent bilaterally. Posterior cerebral arteries: Severe narrowing of the proximal left P2 segment. Normal right PCA. Areas of right P3 segment narrowing on the MIP images of the is favored to be artifactual. Basilar artery: Normal. Vertebral arteries: Codominant. Normal. Superior cerebellar arteries: Normal. Anterior inferior cerebellar arteries: Not clearly visualize, which is not uncommon. Posterior inferior cerebellar arteries: Normal. IMPRESSION: 1. No acute abnormality. 2. Unchanged appearance of old right corona radiata lacunar infarct and posterior left parafalcine meningioma. Mild adjacent edema is unchanged. 3. No emergent large vessel occlusion. Severe narrowing of the proximal left PCA P2 segment. Electronically Signed   By: Ulyses Jarred M.D.   On: 06/21/2017 22:29   US Carotid Bilateral (at [facility] Only)  Result Date: 06/21/2017 CLINICAL DATA:  Stroke symptoms, hypertension, diabetes, new onset left upper extremity weakness  EXAM: BILATERAL CAROTID DUPLEX ULTRASOUND TECHNIQUE: Pearline Cables scale imaging, color Doppler  and duplex ultrasound were performed of bilateral carotid and vertebral arteries in the neck. COMPARISON:  06/21/2017 FINDINGS: Criteria: Quantification of carotid stenosis is based on velocity parameters that correlate the residual internal carotid diameter with NASCET-based stenosis levels, using the diameter of the distal internal carotid lumen as the denominator for stenosis measurement. The following velocity measurements were obtained: RIGHT ICA:  59/7 cm/sec CCA:  16/1 cm/sec SYSTOLIC ICA/CCA RATIO:  0.6 DIASTOLIC ICA/CCA RATIO:  0.8 ECA:  71 cm/sec LEFT ICA:  93/7 cm/sec CCA:  09/60 cm/sec SYSTOLIC ICA/CCA RATIO:  1.2 DIASTOLIC ICA/CCA RATIO:  0.7 ECA:  80 cm/sec RIGHT CAROTID ARTERY: Minor echogenic shadowing plaque formation. No hemodynamically significant right ICA stenosis, velocity elevation, or turbulent flow. Degree of narrowing less than 50%. RIGHT VERTEBRAL ARTERY:  Antegrade LEFT CAROTID ARTERY: Similar scattered minor echogenic plaque formation. No hemodynamically significant left ICA stenosis, velocity elevation, or turbulent flow. LEFT VERTEBRAL ARTERY:  Antegrade IMPRESSION: Minor carotid atherosclerosis. No hemodynamically significant ICA stenosis. Degree of narrowing less than 50% bilaterally by ultrasound criteria. Patent antegrade vertebral flow bilaterally Electronically Signed   By: Jerilynn Mages.  Shick M.D.   On: 06/21/2017 21:26   Mr Jodene Nam Head/brain AV Cm  Result Date: 06/21/2017 CLINICAL DATA:  Stroke.  Left-sided weakness. EXAM: MRI HEAD WITHOUT CONTRAST MRA HEAD WITHOUT CONTRAST TECHNIQUE: Multiplanar, multiecho pulse sequences of the brain and surrounding structures were obtained without intravenous contrast. Angiographic images of the head were obtained using MRA technique without contrast. COMPARISON:  Head CT 06/21/2017 MR brain 06/23/2016 FINDINGS: MRI HEAD FINDINGS Brain: The midline structures are  normal. No focal diffusion restriction to indicate acute infarct. No intraparenchymal hemorrhage. There is an old right corona radiata lacunar infarct with associated hemosiderin deposition. Meningioma projecting to the left from the posterior falx cerebri measures 1.8 cm, unchanged. Minimal adjacent edema is also unchanged. No chronic microhemorrhage or cerebral amyloid angiopathy. No hydrocephalus, age advanced atrophy or lobar predominant volume loss. No dural abnormality or extra-axial collection. Skull and upper cervical spine: The visualized skull base, calvarium, upper cervical spine and extracranial soft tissues are normal. Sinuses/Orbits: No fluid levels or advanced mucosal thickening. No mastoid effusion. Normal orbits. MRA HEAD FINDINGS Intracranial internal carotid arteries: Normal Anterior cerebral arteries: Normal. Middle cerebral arteries: Normal. Posterior communicating arteries: Absent bilaterally. Posterior cerebral arteries: Severe narrowing of the proximal left P2 segment. Normal right PCA. Areas of right P3 segment narrowing on the MIP images of the is favored to be artifactual. Basilar artery: Normal. Vertebral arteries: Codominant. Normal. Superior cerebellar arteries: Normal. Anterior inferior cerebellar arteries: Not clearly visualize, which is not uncommon. Posterior inferior cerebellar arteries: Normal. IMPRESSION: 1. No acute abnormality. 2. Unchanged appearance of old right corona radiata lacunar infarct and posterior left parafalcine meningioma. Mild adjacent edema is unchanged. 3. No emergent large vessel occlusion. Severe narrowing of the proximal left PCA P2 segment. Electronically Signed   By: Ulyses Jarred M.D.   On: 06/21/2017 22:29   Ct Head Code Stroke Wo Contrast  Result Date: 06/21/2017 CLINICAL DATA:  Code stroke. Focal neuro deficit less than 6 hours, stroke suspected. Left-sided weakness EXAM: CT HEAD WITHOUT CONTRAST TECHNIQUE: Contiguous axial images were obtained  from the base of the skull through the vertex without intravenous contrast. COMPARISON:  CT head 03/11/2017 FINDINGS: Brain: Moderate atrophy. Chronic infarct right basal ganglia unchanged. Negative for acute infarct, hemorrhage, mass lesion. No change from the prior CT Vascular: Negative for hyperdense vessel Skull: Negative Sinuses/Orbits: Negative Other: None ASPECTS ([place] Stroke Program  Early CT Score) - Ganglionic level infarction (caudate, lentiform nuclei, internal capsule, insula, M1-M3 cortex): 7 - Supraganglionic infarction (M4-M6 cortex): 3 Total score (0-10 with 10 being normal): 10 IMPRESSION: 1. No acute intracranial abnormality. Atrophy and chronic basal ganglia infarct on the right. 2. ASPECTS is 10 These results were called by telephone at the time of interpretation on 06/21/2017 at 10:10 am to Dr. Lenise Arena , who verbally acknowledged these results. Electronically Signed   By: Franchot Gallo M.D.   On: 06/21/2017 10:10       Today   Subjective:   Vanessa [name] patient states that the left-sided weakness improved.    Blood pressure 133/61, pulse 90, temperature 98.5 F (36.9 C), temperature source Oral, resp. rate (!) 24, height 5\' 5"  (1.651 m), weight 150 lb (68 kg), SpO2 95 %.  .  Intake/Output Summary (Last 24 hours) at 06/22/17 1401 Last data filed at 06/21/17 1838  Gross per 24 hour  Intake              480 ml  Output                0 ml  Net              480 ml    Exam VITAL SIGNS: Blood pressure 133/61, pulse 90, temperature 98.5 F (36.9 C), temperature source Oral, resp. rate (!) 24, height 5\' 5"  (1.651 m), weight 150 lb (68 kg), SpO2 95 %.  GENERAL:  83 y.o.-year-old patient lying in the bed with no acute distress.  EYES: Pupils equal, round, reactive to light and accommodation. No scleral icterus. Extraocular muscles intact.  HEENT: Head atraumatic, normocephalic. Oropharynx and nasopharynx clear.  NECK:  Supple, no jugular venous distention. No  thyroid enlargement, no tenderness.  LUNGS: Normal breath sounds bilaterally, no wheezing, rales,rhonchi or crepitation. No use of accessory muscles of respiration.  CARDIOVASCULAR: S1, S2 normal. No murmurs, rubs, or gallops.  ABDOMEN: Soft, nontender, nondistended. Bowel sounds present. No organomegaly or mass.  EXTREMITIES: No pedal edema, cyanosis, or clubbing.  NEUROLOGIC: Cranial nerves II through XII are intDiminished strength in both lower extremitiesensation intact. Gait not checked.  PSYCHIATRIC: The patient is alert and oriented x 3.  SKIN: No obvious rash, lesion, or ulcer.   Data Review     CBC w Diff: Lab Results  Component Value Date   WBC 5.8 06/21/2017   HGB 12.5 06/21/2017   HGB 12.5 01/23/2015   HCT 33.9 (L) 06/21/2017   HCT 34.7 (L) 01/23/2015   PLT 214 06/21/2017   PLT 211 01/23/2015   LYMPHOPCT 24 06/21/2017   LYMPHOPCT 17.8 08/25/2014   MONOPCT 11 06/21/2017   MONOPCT 8.7 08/25/2014   EOSPCT 0 06/21/2017   EOSPCT 0.0 08/25/2014   BASOPCT 0 06/21/2017   BASOPCT 0.0 08/25/2014   CMP: Lab Results  Component Value Date   NA 139 06/21/2017   NA 135 01/23/2015   K 4.1 06/21/2017   K 4.9 01/23/2015   CL 107 06/21/2017   CL 103 01/23/2015   CO2 25 06/21/2017   CO2 25 01/23/2015   BUN 21 (H) 06/21/2017   BUN 30 (H) 01/23/2015   CREATININE 1.12 (H) 06/21/2017   CREATININE 1.07 (H) 01/23/2015   PROT 6.7 06/21/2017   PROT 7.0 01/23/2015   ALBUMIN 4.1 06/21/2017   ALBUMIN 4.5 01/23/2015   BILITOT 2.0 (H) 06/21/2017   BILITOT 1.8 (H) 01/23/2015   ALKPHOS 58 06/21/2017   ALKPHOS 61 01/23/2015  AST 20 06/21/2017   AST 29 01/23/2015   ALT 8 (L) 06/21/2017   ALT 14 01/23/2015  .  Micro Results No results found for this or any previous visit (from the past 240 hour(s)).      Code Status Orders        Start     Ordered   06/21/17 1239  Full code  Continuous     06/21/17 1238    Code Status History    Date Active Date Inactive Code Status  Order ID Comments User Context   06/23/2016 12:53 PM 06/24/2016  7:04 AM DNR [id number]  Bettey Costa, MD Inpatient   06/23/2016 12:11 PM 06/23/2016 12:53 PM Full Code [id number]  Bettey Costa, MD Inpatient   06/23/2016 11:18 AM 06/23/2016 12:11 PM DNR [id number]  Bettey Costa, MD ED            Discharge Medications   Allergies as of 06/22/2017      Reactions   Benadryl [diphenhydramine Hcl] Other (See Comments)   Pt states that it makes her legs jump.    Codeine Nausea And Vomiting   Oxycodone Nausea And Vomiting, Other (See Comments)   "Makes me real irritable and I feel terrible."   Penicillins Other (See Comments)   Pt states that it does not work for her.  Has patient had a PCN reaction causing immediate rash, facial/tongue/throat swelling, SOB or lightheadedness with hypotension: No Has patient had a PCN reaction causing severe rash involving mucus membranes or skin necrosis: No Has patient had a PCN reaction that required hospitalization: No Has patient had a PCN reaction occurring within the last 10 years: No If all of the above answers are "NO", then may proceed with Cephalosporin use.      Medication List    TAKE these medications   amLODipine 5 MG tablet Commonly known as:  NORVASC Take 5 mg by mouth every morning.   aspirin 325 MG tablet Take 1 tablet (325 mg total) by mouth daily.   atorvastatin 40 MG tablet Commonly known as:  LIPITOR Take 1 tablet (40 mg total) by mouth daily. What changed:  medication strength  how much to take  when to take this   citalopram 20 MG tablet Commonly known as:  CELEXA Take 20 mg by mouth daily.   levETIRAcetam 500 MG tablet Commonly known as:  KEPPRA Take 1 tablet (500 mg total) by mouth 2 (two) times daily.   levothyroxine 75 MCG tablet Commonly known as:  SYNTHROID, LEVOTHROID Take 75 mcg by mouth daily before breakfast.            Discharge Care Instructions        Start     Ordered   06/22/17 0000   atorvastatin (LIPITOR) 40 MG tablet  Daily     06/22/17 1244         Total Time in preparing paper work, data evaluation and todays exam - 35 minutes  Dustin Flock M.D on 06/22/2017 at 2:01 [facility]  [facility]   Office  [phone]

## 2017-06-22 NOTE — Progress Notes (Signed)
Anticoagulation monitoring(Lovenox):  83yo  female ordered Lovenox 30 mg Q24h  Filed Weights   06/21/17 0944  Weight: 150 lb (68 kg)   Body mass index is 24.96 kg/m.    Lab Results  Component Value Date   CREATININE 1.12 (H) 06/21/2017   CREATININE 1.37 (H) 03/11/2017   CREATININE 1.07 (H) 06/23/2016   Estimated Creatinine Clearance: 33.6 mL/min (A) (by C-G formula based on SCr of 1.12 mg/dL (H)). Hemoglobin & Hematocrit     Component Value Date/Time   HGB 12.5 06/21/2017 0946   HGB 12.5 01/23/2015 1847   HCT 33.9 (L) 06/21/2017 0946   HCT 34.7 (L) 01/23/2015 1847     Per Protocol for Patient with estCrcl > 30 ml/min and BMI < 40, will transition to Lovenox 40 mg Q24h.

## 2017-06-22 NOTE — Evaluation (Signed)
Occupational Therapy Evaluation Patient Details Name: Vanessa [name] MRN: [id number] DOB: 1931-07-29 Today's Date: 06/22/2017    History of Present Illness Pt. is an 83 y.o. female who was admitted to [facility] With a new onset of LUE weakness, and numbness. Pt. PMHX includes: LUE weakness from a previous CVA, DM, CVA, Back Surgery, Breast surgery, and Colon surgery.   Clinical Impression   Pt. Is an 83 y.o. Female who was admitted to [facility] with a new onset of LUE weakenss, and numbness.  Pt. Presents with LUE weakness, left hand flexor contracture, pain, and limited functional mobility. Pt. Previously required assist with ADL tasks, and was receiving 4 hours of help from a personal care aide. Pt. Education was provided about hand hygiene for the left hand secondary to a left hand contracture.Pt. could benefit from skilled OT services for ADL training, neuromuscular re-ed, positioning, there. Ex, contracture management, work simplification techniques, and pt. Education about home modification, and DME. Pt. Plans to return home with caregiver assist upon discharge. Pt. Would benefit from [facility] services to follow-up.   Follow Up Recommendations  Home health OT    Equipment Recommendations       Recommendations for Other Services       Precautions / Restrictions Precautions Precautions: Fall Restrictions Weight Bearing Restrictions: No                                                    ADL either performed or assessed with clinical judgement   ADL Overall ADL's : Needs assistance/impaired Eating/Feeding: Set up   Grooming: Minimal assistance;Set up   Upper Body Bathing: Moderate assistance   Lower Body Bathing: Moderate assistance   Upper Body Dressing : Moderate assistance   Lower Body Dressing: Moderate assistance               Functional mobility during ADLs: Min guard General ADL Comments: Pt. education was provided about hand hygiene for her left  hand.     Vision  Pt. Reports her vision is progressively diminishing.       Perception     Praxis      Pertinent Vitals/Pain Pain Assessment: 0-10 Pain Score: 4  Pain Location: neck Pain Descriptors / Indicators: Aching Pain Intervention(s): Limited activity within patient's tolerance;Monitored during session;Repositioned     Hand Dominance Right   Extremity/Trunk Assessment Upper Extremity Assessment Upper Extremity Assessment: LUE deficits/detail LUE Deficits / Details: Left shoulder flexion, abduction 3-/5, elbow flexion. extension, 3+/5, pt. left hand has a flexor contracture.  LUE Sensation: decreased light touch LUE Coordination: decreased fine motor;decreased gross motor           Communication Communication Communication: No difficulties   Cognition Arousal/Alertness: Awake/alert Behavior During Therapy: WFL for tasks assessed/performed Overall Cognitive Status: Within Functional Limits for tasks assessed                                     General Comments       Exercises     Shoulder Instructions      Home Living Family/patient expects to be discharged to:: Private residence Living Arrangements: Spouse/significant other Available Help at Discharge: Family;Personal care attendant Type of Home: House Home Access: Ramped entrance     Home Layout: One  level     Bathroom Shower/Tub: Walk-in shower;Curtain   Bathroom Toilet: Handicapped height     Home Equipment: Environmental consultant - 2 wheels;Shower seat;Grab bars - tub/shower          Prior Functioning/Environment Level of Independence: Needs assistance    ADL's / Homemaking Assistance Needed: Pt. has a personal care aide who assists with bathing, grooming, meal preparation, and home making tasks, and provided set-up of medication into a pillbox. Pt. was independent with medication management once the pillbox is set-up.            OT Problem List: Decreased strength;Decreased  range of motion;Decreased activity tolerance;Pain;Impaired UE functional use;Decreased knowledge of use of DME or AE      OT Treatment/Interventions: Self-care/ADL training;Therapeutic exercise;Patient/family education;Therapeutic activities;DME and/or AE instruction;Energy conservation    OT Goals(Current goals can be found in the care plan section) Acute Rehab OT Goals Patient Stated Goal: To return home OT Goal Formulation: With patient Potential to Achieve Goals: Good  OT Frequency: Min 1X/week   Barriers to D/C:            Co-evaluation              AM-PAC PT "6 Clicks" Daily Activity     Outcome Measure Help from another person eating meals?: A Little Help from another person taking care of personal grooming?: A Little Help from another person toileting, which includes using toliet, bedpan, or urinal?: A Little Help from another person bathing (including washing, rinsing, drying)?: A Lot Help from another person to put on and taking off regular upper body clothing?: A Lot Help from another person to put on and taking off regular lower body clothing?: A Lot 6 Click Score: 15   End of Session    Activity Tolerance: Patient tolerated treatment well Patient left: in bed;with call bell/phone within reach;with bed alarm set  OT Visit Diagnosis: Muscle weakness (generalized) (M62.81)                Time: 6606-3016 OT Time Calculation (min): 28 min Charges:  OT General Charges $OT Visit: 1 Visit OT Evaluation $OT Eval Moderate Complexity: 1 Mod G-Codes: OT G-codes **NOT FOR INPATIENT CLASS** Functional Limitation: Self care Self Care Current Status (W1093): At least 40 percent but less than 60 percent impaired, limited or restricted Self Care Goal Status (A3557): At least 1 percent but less than 20 percent impaired, limited or restricted   Harrel Carina, MS, OTR/L   Harrel Carina, MS, OTR/L 06/22/2017, 11:58 AM

## 2017-06-22 NOTE — Progress Notes (Signed)
SLP Cancellation Note  Patient Details Name: Vanessa [name] MRN: [id number] DOB: 09/04/1931   Cancelled treatment:       Reason Eval/Treat Not Completed: SLP screened, no needs identified, will sign off (chart reviewed; consulted MD/NSG then met w/ pt).  Pt denied any difficulty swallowing and is currently on a regular diet; though she is edentulous. Encouraged her to have her meats cut some and moistened w/ gravy for easier gumming/mastication. She tolerates swallowing pills w/ water per NSG; gave precaution education on using large jug straw for water drinking/pill swallowing. Instruction on using applesauce for swallowing pills provided to NSG.  Pt conversed at conversational level w/out deficits noted; pt is edentulous so this can impact precise articulation of speech. Pt denied any new speech-language deficits.  No further skilled ST services indicated as pt appears at her baseline. Pt agreed. NSG to reconsult if any change in status.    Orinda Kenner, MS, CCC-SLP Watson,Katherine 06/22/2017, 11:23 AM

## 2017-06-22 NOTE — Progress Notes (Signed)
Subjective: Patient reports that her sensory complaints have improved but she does not feel back to baseline.  Also has some neck pain.    Objective: Current vital signs: BP (!) 112/47 (BP Location: Right Arm)   Pulse 74   Temp 97.6 F (36.4 C) (Oral)   Resp (!) 24   Ht 5\' 5"  (1.651 m)   Wt 68 kg (150 lb)   SpO2 96%   BMI 24.96 kg/m  Vital signs in last 24 hours: Temp:  [97.5 F (36.4 C)-99 F (37.2 C)] 97.6 F (36.4 C) (09/21 0800) Pulse Rate:  [58-79] 74 (09/21 0800) Resp:  [18-24] 24 (09/21 0800) BP: (100-131)/(44-61) 112/47 (09/21 0800) SpO2:  [95 %-100 %] 96 % (09/21 0800)  Intake/Output from previous day: 09/20 0701 - 09/21 0700 In: 480 [P.O.:480] Out: -  Intake/Output this shift: No intake/output data recorded. Nutritional status: Diet Heart Room service appropriate? Yes; Fluid consistency: Thin  Neurologic Exam: Mental Status: Alert, oriented, thought content appropriate.  Speech fluent without evidence of aphasia.  Mild dysarthria.  Able to follow 3 step commands without difficulty. Cranial Nerves: II: Discs flat bilaterally; Visual fields grossly normal, pupils equal, round, reactive to light and accommodation III,IV, VI: ptosis not present, extra-ocular motions intact bilaterally V,VII: mild left facial droop, facial light touch sensation decreased on the left VIII: hearing normal bilaterally IX,X: gag reflex present XI: bilateral shoulder shrug XII: midline tongue extension Motor: Right :  Upper extremity   5/5                                      Left:     Upper extremity   5-/5             Lower extremity   5-/5                                                 Lower extremity   3/5 Increased tone in the LUE with fisting noted Sensory: Pinprick and light touch decreased in the LUE and LLE   Lab Results: Basic Metabolic Panel:  Recent Labs Lab 06/21/17 0946  NA 139  K 4.1  CL 107  CO2 25  GLUCOSE 110*  BUN 21*  CREATININE 1.12*  CALCIUM 9.1     Liver Function Tests:  Recent Labs Lab 06/21/17 0946  AST 20  ALT 8*  ALKPHOS 58  BILITOT 2.0*  PROT 6.7  ALBUMIN 4.1   No results for input(s): LIPASE, AMYLASE in the last 168 hours. No results for input(s): AMMONIA in the last 168 hours.  CBC:  Recent Labs Lab 06/21/17 0946  WBC 5.8  NEUTROABS 3.8  HGB 12.5  HCT 33.9*  MCV 99.4  PLT 214    Cardiac Enzymes:  Recent Labs Lab 06/21/17 0946  TROPONINI <0.03    Lipid Panel:  Recent Labs Lab 06/22/17 0417  CHOL 182  TRIG 103  HDL 44  CHOLHDL 4.1  VLDL 21  LDLCALC 117*    CBG:  Recent Labs Lab 06/21/17 1636 06/21/17 2106 06/21/17 2319 06/22/17 0734  GLUCAP 108* 130* 120* 106*    Microbiology: Results for orders placed or performed during the hospital encounter of 03/11/17  Urine culture     Status: None   Collection  Time: 03/11/17  8:51 AM  Result Value Ref Range Status   Specimen Description URINE, RANDOM  Final   Special Requests NONE  Final   Culture   Final    NO GROWTH Performed at [facility], [address] [place], [place] [address]    Report Status 03/12/2017 FINAL  Final    Coagulation Studies:  Recent Labs  06/21/17 0946  LABPROT 13.7  INR 1.06    Imaging: Mr Brain Wo Contrast  Result Date: 06/21/2017 CLINICAL DATA:  Stroke.  Left-sided weakness. EXAM: MRI HEAD WITHOUT CONTRAST MRA HEAD WITHOUT CONTRAST TECHNIQUE: Multiplanar, multiecho pulse sequences of the brain and surrounding structures were obtained without intravenous contrast. Angiographic images of the head were obtained using MRA technique without contrast. COMPARISON:  Head CT 06/21/2017 MR brain 06/23/2016 FINDINGS: MRI HEAD FINDINGS Brain: The midline structures are normal. No focal diffusion restriction to indicate acute infarct. No intraparenchymal hemorrhage. There is an old right corona radiata lacunar infarct with associated hemosiderin deposition. Meningioma projecting to the left from the  posterior falx cerebri measures 1.8 cm, unchanged. Minimal adjacent edema is also unchanged. No chronic microhemorrhage or cerebral amyloid angiopathy. No hydrocephalus, age advanced atrophy or lobar predominant volume loss. No dural abnormality or extra-axial collection. Skull and upper cervical spine: The visualized skull base, calvarium, upper cervical spine and extracranial soft tissues are normal. Sinuses/Orbits: No fluid levels or advanced mucosal thickening. No mastoid effusion. Normal orbits. MRA HEAD FINDINGS Intracranial internal carotid arteries: Normal Anterior cerebral arteries: Normal. Middle cerebral arteries: Normal. Posterior communicating arteries: Absent bilaterally. Posterior cerebral arteries: Severe narrowing of the proximal left P2 segment. Normal right PCA. Areas of right P3 segment narrowing on the MIP images of the is favored to be artifactual. Basilar artery: Normal. Vertebral arteries: Codominant. Normal. Superior cerebellar arteries: Normal. Anterior inferior cerebellar arteries: Not clearly visualize, which is not uncommon. Posterior inferior cerebellar arteries: Normal. IMPRESSION: 1. No acute abnormality. 2. Unchanged appearance of old right corona radiata lacunar infarct and posterior left parafalcine meningioma. Mild adjacent edema is unchanged. 3. No emergent large vessel occlusion. Severe narrowing of the proximal left PCA P2 segment. Electronically Signed   By: Ulyses Jarred M.D.   On: 06/21/2017 22:29   US Carotid Bilateral (at [facility] Only)  Result Date: 06/21/2017 CLINICAL DATA:  Stroke symptoms, hypertension, diabetes, new onset left upper extremity weakness EXAM: BILATERAL CAROTID DUPLEX ULTRASOUND TECHNIQUE: Pearline Cables scale imaging, color Doppler and duplex ultrasound were performed of bilateral carotid and vertebral arteries in the neck. COMPARISON:  06/21/2017 FINDINGS: Criteria: Quantification of carotid stenosis is based on velocity parameters that correlate the  residual internal carotid diameter with NASCET-based stenosis levels, using the diameter of the distal internal carotid lumen as the denominator for stenosis measurement. The following velocity measurements were obtained: RIGHT ICA:  59/7 cm/sec CCA:  23/5 cm/sec SYSTOLIC ICA/CCA RATIO:  0.6 DIASTOLIC ICA/CCA RATIO:  0.8 ECA:  71 cm/sec LEFT ICA:  93/7 cm/sec CCA:  57/32 cm/sec SYSTOLIC ICA/CCA RATIO:  1.2 DIASTOLIC ICA/CCA RATIO:  0.7 ECA:  80 cm/sec RIGHT CAROTID ARTERY: Minor echogenic shadowing plaque formation. No hemodynamically significant right ICA stenosis, velocity elevation, or turbulent flow. Degree of narrowing less than 50%. RIGHT VERTEBRAL ARTERY:  Antegrade LEFT CAROTID ARTERY: Similar scattered minor echogenic plaque formation. No hemodynamically significant left ICA stenosis, velocity elevation, or turbulent flow. LEFT VERTEBRAL ARTERY:  Antegrade IMPRESSION: Minor carotid atherosclerosis. No hemodynamically significant ICA stenosis. Degree of narrowing less than 50% bilaterally by  ultrasound criteria. Patent antegrade vertebral flow bilaterally Electronically Signed   By: Jerilynn Mages.  Shick M.D.   On: 06/21/2017 21:26   Mr Jodene Nam Head/brain ZO Cm  Result Date: 06/21/2017 CLINICAL DATA:  Stroke.  Left-sided weakness. EXAM: MRI HEAD WITHOUT CONTRAST MRA HEAD WITHOUT CONTRAST TECHNIQUE: Multiplanar, multiecho pulse sequences of the brain and surrounding structures were obtained without intravenous contrast. Angiographic images of the head were obtained using MRA technique without contrast. COMPARISON:  Head CT 06/21/2017 MR brain 06/23/2016 FINDINGS: MRI HEAD FINDINGS Brain: The midline structures are normal. No focal diffusion restriction to indicate acute infarct. No intraparenchymal hemorrhage. There is an old right corona radiata lacunar infarct with associated hemosiderin deposition. Meningioma projecting to the left from the posterior falx cerebri measures 1.8 cm, unchanged. Minimal adjacent edema is  also unchanged. No chronic microhemorrhage or cerebral amyloid angiopathy. No hydrocephalus, age advanced atrophy or lobar predominant volume loss. No dural abnormality or extra-axial collection. Skull and upper cervical spine: The visualized skull base, calvarium, upper cervical spine and extracranial soft tissues are normal. Sinuses/Orbits: No fluid levels or advanced mucosal thickening. No mastoid effusion. Normal orbits. MRA HEAD FINDINGS Intracranial internal carotid arteries: Normal Anterior cerebral arteries: Normal. Middle cerebral arteries: Normal. Posterior communicating arteries: Absent bilaterally. Posterior cerebral arteries: Severe narrowing of the proximal left P2 segment. Normal right PCA. Areas of right P3 segment narrowing on the MIP images of the is favored to be artifactual. Basilar artery: Normal. Vertebral arteries: Codominant. Normal. Superior cerebellar arteries: Normal. Anterior inferior cerebellar arteries: Not clearly visualize, which is not uncommon. Posterior inferior cerebellar arteries: Normal. IMPRESSION: 1. No acute abnormality. 2. Unchanged appearance of old right corona radiata lacunar infarct and posterior left parafalcine meningioma. Mild adjacent edema is unchanged. 3. No emergent large vessel occlusion. Severe narrowing of the proximal left PCA P2 segment. Electronically Signed   By: Ulyses Jarred M.D.   On: 06/21/2017 22:29   Ct Head Code Stroke Wo Contrast  Result Date: 06/21/2017 CLINICAL DATA:  Code stroke. Focal neuro deficit less than 6 hours, stroke suspected. Left-sided weakness EXAM: CT HEAD WITHOUT CONTRAST TECHNIQUE: Contiguous axial images were obtained from the base of the skull through the vertex without intravenous contrast. COMPARISON:  CT head 03/11/2017 FINDINGS: Brain: Moderate atrophy. Chronic infarct right basal ganglia unchanged. Negative for acute infarct, hemorrhage, mass lesion. No change from the prior CT Vascular: Negative for hyperdense vessel  Skull: Negative Sinuses/Orbits: Negative Other: None ASPECTS ([place] Stroke Program Early CT Score) - Ganglionic level infarction (caudate, lentiform nuclei, internal capsule, insula, M1-M3 cortex): 7 - Supraganglionic infarction (M4-M6 cortex): 3 Total score (0-10 with 10 being normal): 10 IMPRESSION: 1. No acute intracranial abnormality. Atrophy and chronic basal ganglia infarct on the right. 2. ASPECTS is 10 These results were called by telephone at the time of interpretation on 06/21/2017 at 10:10 am to Dr. Lenise Arena , who verbally acknowledged these results. Electronically Signed   By: Franchot Gallo M.D.   On: 06/21/2017 10:10    Medications:  I have reviewed the patient's current medications. Scheduled: . amLODipine  5 mg Oral BH-q7a  . aspirin EC  325 mg Oral Daily  . atorvastatin  20 mg Oral q1800  . citalopram  20 mg Oral Daily  . enoxaparin (LOVENOX) injection  30 mg Subcutaneous Q24H  . insulin aspart  0-5 Units Subcutaneous QHS  . insulin aspart  0-9 Units Subcutaneous TID WC  . levETIRAcetam  500 mg Oral BID  . levothyroxine  75 mcg Oral  QAC breakfast    Assessment/Plan: Patient improving.  MRI of the brain reviewed and shows no acute changes.  Patient on ASA and a statin.  Due to GI history will not place on ASA and Plavix.  Carotid dopplers show no evidence of hemodynamically significant stenosis.  Echocardiogram pending.  LDL 117.  Recommendations: 1.  Continue ASA 2.  Aggressive lipid management with target LDL<70. 3.  Continued therapy   LOS: 0 days   Alexis Goodell, MD Neurology [phone] 06/22/2017  11:01 AM

## 2017-06-22 NOTE — Discharge Instructions (Signed)
[facility] [place] [facility] ° °DIET:  °Cardiac diet ° °DISCHARGE CONDITION:  °Stable ° °ACTIVITY:  °Activity as tolerated ° °OXYGEN:  °Home Oxygen: No. °  °Oxygen Delivery: room air ° °DISCHARGE LOCATION:  °home  ° ° °ADDITIONAL DISCHARGE INSTRUCTION: ° ° °If you experience worsening of your admission symptoms, develop shortness of breath, life threatening emergency, suicidal or homicidal thoughts you must seek medical attention immediately by calling 911 or calling your MD immediately  if symptoms less severe. ° °You Must read complete instructions/literature along with all the possible adverse reactions/side effects for all the Medicines you take and that have been prescribed to you. Take any new Medicines after you have completely understood and accpet all the possible adverse reactions/side effects.  ° °Please note ° °You were cared for by a hospitalist during your hospital stay. If you have any questions about your discharge medications or the care you received while you were in the hospital after you are discharged, you can call the unit and asked to speak with the hospitalist on call if the hospitalist that took care of you is not available. Once you are discharged, your primary care physician will handle any further medical issues. Please note that NO REFILLS for any discharge medications will be authorized once you are discharged, as it is imperative that you return to your primary care physician (or establish a relationship with a primary care physician if you do not have one) for your aftercare needs so that they can reassess your need for medications and monitor your lab values. ° ° °

## 2017-06-24 LAB — HEMOGLOBIN A1C: Mean Plasma Glucose: <74 mg/dL

## 2017-08-14 ENCOUNTER — Emergency Department: Payer: Medicare Other

## 2017-08-14 ENCOUNTER — Encounter: Payer: Self-pay | Admitting: Emergency Medicine

## 2017-08-14 ENCOUNTER — Observation Stay: Payer: Medicare Other

## 2017-08-14 ENCOUNTER — Observation Stay
Admission: EM | Admit: 2017-08-14 | Discharge: 2017-08-16 | Disposition: A | Discharge status: Home / Self Care | Payer: Medicare Other | Attending: Internal Medicine | Admitting: Internal Medicine

## 2017-08-14 ENCOUNTER — Other Ambulatory Visit: Payer: Self-pay

## 2017-08-14 DIAGNOSIS — M47812 Spondylosis without myelopathy or radiculopathy, cervical region: Secondary | ICD-10-CM | POA: Diagnosis not present

## 2017-08-14 DIAGNOSIS — F419 Anxiety disorder, unspecified: Secondary | ICD-10-CM | POA: Diagnosis not present

## 2017-08-14 DIAGNOSIS — M4050 Lordosis, unspecified, site unspecified: Secondary | ICD-10-CM | POA: Insufficient documentation

## 2017-08-14 DIAGNOSIS — Z880 Allergy status to penicillin: Secondary | ICD-10-CM | POA: Diagnosis not present

## 2017-08-14 DIAGNOSIS — Z85038 Personal history of other malignant neoplasm of large intestine: Secondary | ICD-10-CM | POA: Insufficient documentation

## 2017-08-14 DIAGNOSIS — Z7982 Long term (current) use of aspirin: Secondary | ICD-10-CM | POA: Insufficient documentation

## 2017-08-14 DIAGNOSIS — F329 Major depressive disorder, single episode, unspecified: Secondary | ICD-10-CM | POA: Diagnosis not present

## 2017-08-14 DIAGNOSIS — E119 Type 2 diabetes mellitus without complications: Secondary | ICD-10-CM | POA: Diagnosis not present

## 2017-08-14 DIAGNOSIS — R059 Cough, unspecified: Secondary | ICD-10-CM

## 2017-08-14 DIAGNOSIS — E559 Vitamin D deficiency, unspecified: Secondary | ICD-10-CM | POA: Insufficient documentation

## 2017-08-14 DIAGNOSIS — Z8673 Personal history of transient ischemic attack (TIA), and cerebral infarction without residual deficits: Secondary | ICD-10-CM | POA: Diagnosis not present

## 2017-08-14 DIAGNOSIS — M25562 Pain in left knee: Secondary | ICD-10-CM | POA: Diagnosis present

## 2017-08-14 DIAGNOSIS — E039 Hypothyroidism, unspecified: Secondary | ICD-10-CM | POA: Diagnosis not present

## 2017-08-14 DIAGNOSIS — E538 Deficiency of other specified B group vitamins: Secondary | ICD-10-CM | POA: Insufficient documentation

## 2017-08-14 DIAGNOSIS — W1830XA Fall on same level, unspecified, initial encounter: Secondary | ICD-10-CM | POA: Insufficient documentation

## 2017-08-14 DIAGNOSIS — R05 Cough: Secondary | ICD-10-CM

## 2017-08-14 DIAGNOSIS — E785 Hyperlipidemia, unspecified: Secondary | ICD-10-CM | POA: Insufficient documentation

## 2017-08-14 DIAGNOSIS — F4325 Adjustment disorder with mixed disturbance of emotions and conduct: Secondary | ICD-10-CM | POA: Diagnosis not present

## 2017-08-14 DIAGNOSIS — R2681 Unsteadiness on feet: Secondary | ICD-10-CM | POA: Insufficient documentation

## 2017-08-14 DIAGNOSIS — Y92009 Unspecified place in unspecified non-institutional (private) residence as the place of occurrence of the external cause: Secondary | ICD-10-CM | POA: Insufficient documentation

## 2017-08-14 DIAGNOSIS — S82142A Displaced bicondylar fracture of left tibia, initial encounter for closed fracture: Principal | ICD-10-CM | POA: Insufficient documentation

## 2017-08-14 DIAGNOSIS — Z79899 Other long term (current) drug therapy: Secondary | ICD-10-CM | POA: Insufficient documentation

## 2017-08-14 DIAGNOSIS — I700 Atherosclerosis of aorta: Secondary | ICD-10-CM | POA: Insufficient documentation

## 2017-08-14 DIAGNOSIS — S82209A Unspecified fracture of shaft of unspecified tibia, initial encounter for closed fracture: Secondary | ICD-10-CM | POA: Diagnosis present

## 2017-08-14 DIAGNOSIS — M8580 Other specified disorders of bone density and structure, unspecified site: Secondary | ICD-10-CM | POA: Diagnosis not present

## 2017-08-14 LAB — CBC WITH DIFFERENTIAL/PLATELET
BASOS PCT: 0 %
Basophils Absolute: 0 10*3/uL (ref 0–0.1)
Eosinophils Absolute: 0 10*3/uL (ref 0–0.7)
Eosinophils Relative: 0 %
HEMATOCRIT: 32.6 % — AB (ref 35.0–47.0)
HEMOGLOBIN: 11.7 g/dL — AB (ref 12.0–16.0)
LYMPHS ABS: 1.5 10*3/uL (ref 1.0–3.6)
LYMPHS PCT: 17 %
MCH: 36.5 pg — ABNORMAL HIGH (ref 26.0–34.0)
MCHC: 36 g/dL (ref 32.0–36.0)
MCV: 101.3 fL — AB (ref 80.0–100.0)
MONOS PCT: 9 %
Monocytes Absolute: 0.8 10*3/uL (ref 0.2–0.9)
NEUTROS ABS: 6.3 10*3/uL (ref 1.4–6.5)
NEUTROS PCT: 74 %
Platelets: 223 10*3/uL (ref 150–440)
RBC: 3.22 MIL/uL — ABNORMAL LOW (ref 3.80–5.20)
RDW: 14.7 % — ABNORMAL HIGH (ref 11.5–14.5)
WBC: 8.6 10*3/uL (ref 3.6–11.0)

## 2017-08-14 LAB — BASIC METABOLIC PANEL
Anion gap: 9 (ref 5–15)
BUN: 26 mg/dL — ABNORMAL HIGH (ref 6–20)
CHLORIDE: 105 mmol/L (ref 101–111)
CO2: 23 mmol/L (ref 22–32)
CREATININE: 1.09 mg/dL — AB (ref 0.44–1.00)
Calcium: 9.2 mg/dL (ref 8.9–10.3)
GFR, EST AFRICAN AMERICAN: 52 mL/min — AB (ref >60)
GFR, EST NON AFRICAN AMERICAN: 45 mL/min — AB (ref >60)
Glucose, Bld: 130 mg/dL — ABNORMAL HIGH (ref 65–99)
Potassium: 4.5 mmol/L (ref 3.5–5.1)
Sodium: 137 mmol/L (ref 135–145)

## 2017-08-14 MED ORDER — ACETAMINOPHEN 325 MG PO TABS: 650.0000 mg | ORAL_TABLET | Freq: Four times a day (QID) | ORAL | Status: DC | PRN

## 2017-08-14 MED ORDER — KETOROLAC TROMETHAMINE 15 MG/ML IJ SOLN
15.0000 mg | Freq: Four times a day (QID) | INTRAMUSCULAR | Status: DC | PRN
  Administered 2017-08-14 – 2017-08-16 (×4): 15 mg via INTRAVENOUS
  Filled 2017-08-14 (×4): qty 1

## 2017-08-14 MED ORDER — SODIUM CHLORIDE 0.9% FLUSH: 3.0000 mL | INTRAVENOUS | Status: DC | PRN

## 2017-08-14 MED ORDER — ACETAMINOPHEN 650 MG RE SUPP: 650.0000 mg | Freq: Four times a day (QID) | RECTAL | Status: DC | PRN

## 2017-08-14 MED ORDER — ONDANSETRON HCL 4 MG/2ML IJ SOLN: 4.0000 mg | Freq: Four times a day (QID) | INTRAMUSCULAR | Status: DC | PRN

## 2017-08-14 MED ORDER — SENNOSIDES-DOCUSATE SODIUM 8.6-50 MG PO TABS: 1.0000 | ORAL_TABLET | Freq: Every evening | ORAL | Status: DC | PRN

## 2017-08-14 MED ORDER — ALBUTEROL SULFATE (2.5 MG/3ML) 0.083% IN NEBU: 2.5000 mg | INHALATION_SOLUTION | RESPIRATORY_TRACT | Status: DC | PRN

## 2017-08-14 MED ORDER — BISACODYL 5 MG PO TBEC: 5.0000 mg | DELAYED_RELEASE_TABLET | Freq: Every day | ORAL | Status: DC | PRN

## 2017-08-14 MED ORDER — IPRATROPIUM-ALBUTEROL 0.5-2.5 (3) MG/3ML IN SOLN
3.0000 mL | Freq: Once | RESPIRATORY_TRACT | Status: AC
  Administered 2017-08-14: 3 mL via RESPIRATORY_TRACT
  Filled 2017-08-14: qty 3

## 2017-08-14 MED ORDER — FENTANYL CITRATE (PF) 100 MCG/2ML IJ SOLN
25.0000 ug | INTRAMUSCULAR | Status: DC | PRN
Start: 2017-08-14 — End: 2017-08-14

## 2017-08-14 MED ORDER — LEVETIRACETAM 500 MG PO TABS
500.0000 mg | ORAL_TABLET | Freq: Two times a day (BID) | ORAL | Status: DC
  Administered 2017-08-14 – 2017-08-16 (×4): 500 mg via ORAL
  Filled 2017-08-14 (×5): qty 1

## 2017-08-14 MED ORDER — ASPIRIN EC 325 MG PO TBEC
325.0000 mg | DELAYED_RELEASE_TABLET | Freq: Every day | ORAL | Status: DC
  Administered 2017-08-15 – 2017-08-16 (×2): 325 mg via ORAL
  Filled 2017-08-14 (×2): qty 1

## 2017-08-14 MED ORDER — ONDANSETRON HCL 4 MG PO TABS: 4.0000 mg | ORAL_TABLET | Freq: Four times a day (QID) | ORAL | Status: DC | PRN

## 2017-08-14 MED ORDER — CITALOPRAM HYDROBROMIDE 20 MG PO TABS
20.0000 mg | ORAL_TABLET | Freq: Every evening | ORAL | Status: DC
  Administered 2017-08-14 – 2017-08-15 (×2): 20 mg via ORAL
  Filled 2017-08-14 (×2): qty 1

## 2017-08-14 MED ORDER — FENTANYL CITRATE (PF) 100 MCG/2ML IJ SOLN
12.5000 ug | INTRAMUSCULAR | Status: DC | PRN
  Administered 2017-08-14 (×3): 12.5 ug via INTRAVENOUS
  Filled 2017-08-14 (×3): qty 2

## 2017-08-14 MED ORDER — SODIUM CHLORIDE 0.9 % IV SOLN: INTRAVENOUS | Status: AC

## 2017-08-14 MED ORDER — SODIUM CHLORIDE 0.9 % IV SOLN
250.0000 mL | INTRAVENOUS | Status: DC | PRN
Start: 2017-08-14 — End: 2017-08-16

## 2017-08-14 MED ORDER — LEVOTHYROXINE SODIUM 25 MCG PO TABS
75.0000 ug | ORAL_TABLET | Freq: Every day | ORAL | Status: DC
  Administered 2017-08-15 – 2017-08-16 (×2): 75 ug via ORAL
  Filled 2017-08-14 (×2): qty 1

## 2017-08-14 MED ORDER — AMLODIPINE BESYLATE 5 MG PO TABS
5.0000 mg | ORAL_TABLET | ORAL | Status: DC
Start: 2017-08-15 — End: 2017-08-16
  Filled 2017-08-14: qty 1

## 2017-08-14 MED ORDER — ENOXAPARIN SODIUM 40 MG/0.4ML ~~LOC~~ SOLN
40.0000 mg | SUBCUTANEOUS | Status: DC
  Administered 2017-08-14: 40 mg via SUBCUTANEOUS
  Filled 2017-08-14: qty 0.4

## 2017-08-14 MED ORDER — ATORVASTATIN CALCIUM 20 MG PO TABS
40.0000 mg | ORAL_TABLET | Freq: Every evening | ORAL | Status: DC
  Administered 2017-08-14 – 2017-08-15 (×2): 40 mg via ORAL
  Filled 2017-08-14 (×2): qty 2

## 2017-08-14 MED ORDER — SODIUM CHLORIDE 0.9% FLUSH
3.0000 mL | Freq: Two times a day (BID) | INTRAVENOUS | Status: DC
  Administered 2017-08-15 – 2017-08-16 (×2): 3 mL via INTRAVENOUS

## 2017-08-14 NOTE — H&P (Signed)
Vanessa [name]    MR#:  [id number]  DATE OF BIRTH:  Jul 17, 1931  DATE OF ADMISSION:  08/14/2017  PRIMARY CARE PHYSICIAN: Soles, Howell Rucks, MD   REQUESTING/REFERRING PHYSICIAN: Orbie Pyo, MD  CHIEF COMPLAINT:   Chief Complaint  Patient presents with  . Fall   Fall accident today. HISTORY OF PRESENT ILLNESS:  Vanessa [name]  is a 83 y.o. female with a known history of colon cancer, diabetes, thyroid disorder and stroke.  The patient has a history of stroke with residual left-sided weakness.  She fell on the floor in the hit her head at home this morning.  She complains of left knee pain with mild movement.  She denies any syncope, dizziness, headache or loss of consciousness.  X-rays show left Minimally depressed fracture of the medial tibial plateau. Orthopedic surgeon Dr. Barnet Pall suggested no indication for surgery.  The ED physician request observation for pain control. PAST MEDICAL HISTORY:   Past Medical History:  Diagnosis Date  . Colon cancer ([place])   . Diabetes mellitus without complication ([place])   . Stroke ([place])   . Thyroid disorder     PAST SURGICAL HISTORY:   Past Surgical History:  Procedure Laterality Date  . BACK SURGERY    . BREAST SURGERY    . COLON SURGERY    . TONSILLECTOMY      SOCIAL HISTORY:   Social History   Tobacco Use  . Smoking status: Never Smoker  . Smokeless tobacco: Never Used  Substance Use Topics  . Alcohol use: No    FAMILY HISTORY:   Family History  Problem Relation Age of Onset  . Breast cancer Mother   . Heart attack Father   . Diabetes Brother     DRUG ALLERGIES:   Allergies  Allergen Reactions  . Benadryl [Diphenhydramine Hcl] Other (See Comments)    Pt states that it makes her legs jump.   . Codeine Nausea And Vomiting  . Oxycodone Nausea And Vomiting and Other (See Comments)    "Makes me real irritable and I feel terrible."  .  Penicillins Other (See Comments)    Pt states that it does not work for her.   Has patient had a PCN reaction causing immediate rash, facial/tongue/throat swelling, SOB or lightheadedness with hypotension: No Has patient had a PCN reaction causing severe rash involving mucus membranes or skin necrosis: No Has patient had a PCN reaction that required hospitalization: No Has patient had a PCN reaction occurring within the last 10 years: No If all of the above answers are "NO", then may proceed with Cephalosporin use.     REVIEW OF SYSTEMS:   Review of Systems  Constitutional: Negative for chills, fever and malaise/fatigue.  HENT: Negative for sore throat.   Eyes: Negative for blurred vision and double vision.  Respiratory: Positive for cough. Negative for hemoptysis, shortness of breath, wheezing and stridor.   Cardiovascular: Negative for chest pain, palpitations, orthopnea and leg swelling.  Gastrointestinal: Negative for abdominal pain, blood in stool, diarrhea, melena, nausea and vomiting.  Genitourinary: Negative for dysuria, flank pain and hematuria.  Musculoskeletal: Positive for joint pain. Negative for back pain.  Skin: Negative for rash.  Neurological: Negative for dizziness, sensory change, focal weakness, seizures, loss of consciousness, weakness and headaches.  Endo/Heme/Allergies: Negative for polydipsia.  Psychiatric/Behavioral: Negative for depression. The patient is not nervous/anxious.     MEDICATIONS AT HOME:   Prior  to Admission medications   Medication Sig Start Date End Date Taking? Authorizing Provider  acetaminophen (TYLENOL) 500 MG tablet Take 500 mg every 8 (eight) hours as needed by mouth for headache.   Yes [provider]  amLODipine (NORVASC) 5 MG tablet Take 5 mg by mouth every morning.    Yes [provider]  aspirin 325 MG tablet Take 1 tablet (325 mg total) by mouth daily. 06/25/16  Yes Dustin Flock, MD  atorvastatin (LIPITOR) 40  MG tablet Take 1 tablet (40 mg total) by mouth daily. 06/22/17 06/22/18 Yes Dustin Flock, MD  citalopram (CELEXA) 20 MG tablet Take 20 mg by mouth daily.   Yes [provider]  levETIRAcetam (KEPPRA) 500 MG tablet Take 1 tablet (500 mg total) by mouth 2 (two) times daily. 06/24/16  Yes Dustin Flock, MD  levothyroxine (SYNTHROID, LEVOTHROID) 75 MCG tablet Take 75 mcg by mouth daily before breakfast.   Yes [provider]      VITAL SIGNS:  Blood pressure (!) 129/58, pulse 88, temperature 97.8 F (36.6 C), temperature source Oral, resp. rate 16, height 5\' 5"  (1.651 m), weight 150 lb (68 kg), SpO2 97 %.  PHYSICAL EXAMINATION:  Physical Exam  GENERAL:  83 y.o.-year-old patient lying in the bed with no acute distress.  EYES: Pupils equal, round, reactive to light and accommodation. No scleral icterus. Extraocular muscles intact.  HEENT: Head atraumatic, normocephalic. Oropharynx and nasopharynx clear.  NECK:  Supple, no jugular venous distention. No thyroid enlargement, no tenderness.  LUNGS: Normal breath sounds bilaterally, no wheezing, but has mild crackles on the left base,  no use of accessory muscles of respiration.  CARDIOVASCULAR: S1, S2 normal. No murmurs, rubs, or gallops.  ABDOMEN: Soft, nontender, nondistended. Bowel sounds present. No organomegaly or mass.  EXTREMITIES: No pedal edema, cyanosis, or clubbing.  NEUROLOGIC: Cranial nerves II through XII are intact. Muscle strength 4/5 in all extremities except the left leg, which she cannot move.. Sensation intact. Gait not checked.  PSYCHIATRIC: The patient is alert and oriented x 3.  SKIN: No obvious rash, lesion, or ulcer.   LABORATORY PANEL:   CBC Recent Labs  Lab 08/14/17 1639  WBC 8.6  HGB 11.7*  HCT 32.6*  PLT 223   ------------------------------------------------------------------------------------------------------------------  Chemistries  Recent Labs  Lab 08/14/17 1639  NA 137  K 4.5    CL 105  CO2 23  GLUCOSE 130*  BUN 26*  CREATININE 1.09*  CALCIUM 9.2   ------------------------------------------------------------------------------------------------------------------  Cardiac Enzymes No results for input(s): TROPONINI in the last 168 hours. ------------------------------------------------------------------------------------------------------------------  RADIOLOGY:  Ct Head Wo Contrast  Result Date: 08/14/2017 CLINICAL DATA:  [place] to the floor after acute onset of left leg weakness. Head struck table. EXAM: CT HEAD WITHOUT CONTRAST CT CERVICAL SPINE WITHOUT CONTRAST TECHNIQUE: Multidetector CT imaging of the head and cervical spine was performed following the standard protocol without intravenous contrast. Multiplanar CT image reconstructions of the cervical spine were also generated. COMPARISON:  CT and MRI 06/29/2017 FINDINGS: CT HEAD FINDINGS Brain: No acute finding. Generalized brain atrophy. Chronic small-vessel ischemic changes of the hemispheric white matter. Old infarction in the right basal ganglia region. No sign of acute infarction, mass lesion, hemorrhage, hydrocephalus or extra-axial collection. Vascular: No abnormal vascular finding. Skull: No skull fracture. Sinuses/Orbits: Clear/normal Other: None CT CERVICAL SPINE FINDINGS Alignment: Exaggerated cervical lordosis. 2 mm degenerative anterolisthesis C4-5. 2 mm degenerative anterolisthesis C6-7 and C7-T1. Skull base and vertebrae: No fracture. Soft tissues and spinal canal: No significant  soft tissue finding. Disc levels: Degenerative spondylosis throughout the cervical spine with disc space narrowing most pronounced at C2-3, C3-4 and C5-6. Facet osteoarthritis throughout the cervical region. No evidence of severe bony canal stenosis. Mild to moderate bony foraminal narrowing throughout the cervical region. Upper chest: Benign appearing scarring. Other: None IMPRESSION: Head CT: No acute or traumatic finding. Old  white matter ischemic changes an old right basal ganglia region infarction. Cervical spine CT: No acute or traumatic finding. Chronic degenerative changes throughout the cervical region as outlined above. Electronically Signed   By: Nelson Chimes M.D.   On: 08/14/2017 14:28   Ct Cervical Spine Wo Contrast  Result Date: 08/14/2017 CLINICAL DATA:  [place] to the floor after acute onset of left leg weakness. Head struck table. EXAM: CT HEAD WITHOUT CONTRAST CT CERVICAL SPINE WITHOUT CONTRAST TECHNIQUE: Multidetector CT imaging of the head and cervical spine was performed following the standard protocol without intravenous contrast. Multiplanar CT image reconstructions of the cervical spine were also generated. COMPARISON:  CT and MRI 06/29/2017 FINDINGS: CT HEAD FINDINGS Brain: No acute finding. Generalized brain atrophy. Chronic small-vessel ischemic changes of the hemispheric white matter. Old infarction in the right basal ganglia region. No sign of acute infarction, mass lesion, hemorrhage, hydrocephalus or extra-axial collection. Vascular: No abnormal vascular finding. Skull: No skull fracture. Sinuses/Orbits: Clear/normal Other: None CT CERVICAL SPINE FINDINGS Alignment: Exaggerated cervical lordosis. 2 mm degenerative anterolisthesis C4-5. 2 mm degenerative anterolisthesis C6-7 and C7-T1. Skull base and vertebrae: No fracture. Soft tissues and spinal canal: No significant soft tissue finding. Disc levels: Degenerative spondylosis throughout the cervical spine with disc space narrowing most pronounced at C2-3, C3-4 and C5-6. Facet osteoarthritis throughout the cervical region. No evidence of severe bony canal stenosis. Mild to moderate bony foraminal narrowing throughout the cervical region. Upper chest: Benign appearing scarring. Other: None IMPRESSION: Head CT: No acute or traumatic finding. Old white matter ischemic changes an old right basal ganglia region infarction. Cervical spine CT: No acute or traumatic  finding. Chronic degenerative changes throughout the cervical region as outlined above. Electronically Signed   By: Nelson Chimes M.D.   On: 08/14/2017 14:28   Ct Knee Left Wo Contrast  Result Date: 08/14/2017 CLINICAL DATA:  Possible medial tibial plateau fracture. EXAM: CT OF THE LEFT KNEE WITHOUT CONTRAST TECHNIQUE: Multidetector CT imaging of the left knee was performed according to the standard protocol. Multiplanar CT image reconstructions were also generated. COMPARISON:  Left knee x-rays from same day. FINDINGS: Bones/Joint/Cartilage There is a minimally depressed fracture of the medial tibial plateau. There is a moderate lipohemarthrosis. Minimal tricompartmental degenerative changes.  Diffuse osteopenia. Ligaments Suboptimally assessed by CT. Muscles and Tendons No focal abnormality.  The extensor mechanism is intact. Soft tissues Unremarkable. IMPRESSION: 1. Minimally depressed fracture of the medial tibial plateau. 2. Moderate lipohemarthrosis. Electronically Signed   By: Titus Dubin M.D.   On: 08/14/2017 15:36   Dg Knee Complete 4 Views Left  Result Date: 08/14/2017 CLINICAL DATA:  Patient fell when arising from a chair when the left leg gave out. Patient ports left knee and hip pain. EXAM: LEFT KNEE - COMPLETE 4+ VIEW COMPARISON:  None in PACs FINDINGS: The bones are subjectively osteopenic. There is subtle irregularity of the contour of the cortex of the proximal tibia at the level of the medial tibial plateau. This may reflect a non depressed or minimally depressed tibial plateau fracture. The lateral tibial plateau appears intact as do the femoral condyles. There is no  patellar fracture. The proximal fibula is intact. There is mild narrowing of the medial and lateral joint spaces. IMPRESSION: Possible non depressed or minimally depressed fracture of the medial tibial plateau. Further evaluation with CT scanning is recommended. Electronically Signed   By: David  Martinique M.D.   On:  08/14/2017 14:38   Dg Hip Unilat W Or Wo Pelvis 2-3 Views Left  Result Date: 08/14/2017 CLINICAL DATA:  Hip pain EXAM: DG HIP (WITH OR WITHOUT PELVIS) 2-3V LEFT COMPARISON:  05/21/2016 FINDINGS: Osteopenia. Surgical rod in the right femur. Pubic symphysis is intact. There is no fracture or dislocation. There are mild degenerative changes. IMPRESSION: No acute osseous abnormality.  Osteopenia. Electronically Signed   By: Donavan Foil M.D.   On: 08/14/2017 14:37      IMPRESSION AND PLAN:   Minimally depressed fracture of the medial tibial plateau. Patient will be placed for observation.  Pain control, PT evaluation and knee immobilizer. No surgery indication per Dr. Marry Guan.  Dehydration.  IV fluid support and follow-up BMP. History of CVA.  Continue aspirin and Lipitor. Hypertension.  Continue Norvasc.  All the records are reviewed and case discussed with ED provider. Management plans discussed with the patient, her daughter and they are in agreement.  CODE STATUS: Full code  TOTAL TIME TAKING CARE OF THIS PATIENT: 48 minutes.    Demetrios Loll M.D on 08/14/2017 at 5:31 PM  Between 7am to 6pm - Pager - [phone]  After 6pm go to [email or web address] - Proofreader  [facility]  Office  [phone]  CC: Primary care physician; Herminio Commons, MD   Note: This dictation was prepared with Dragon dictation along with smaller phrase technology. Any transcriptional errors that result from this process are unin

## 2017-08-14 NOTE — ED Triage Notes (Signed)
Arrives via EMS from home.  States stood up from a chair and left leg "gave out"  [place] onto the floor and hit head on a table.  C/O left knee pain.  No LOC.

## 2017-08-14 NOTE — ED Provider Notes (Signed)
Patient presents to the hospital after a fall with left knee pain.  Suspected left knee tibial plateau fracture confirmed on CT.  Pending call from Dr. Marry Guan about operative versus nonoperative management.  Physical Exam  BP 132/80 (BP Location: Right Arm)   Pulse 77   Temp 97.8 F (36.6 C) (Oral)   Resp 16   Ht 5\' 5"  (1.651 m)   Wt 68 kg (150 lb)   SpO2 99%   BMI 24.96 kg/m  ----------------------------------------- 4:55 PM on 08/14/2017 -----------------------------------------   Physical Exam Patient is calm and without distress at this time.  States she is not having any pain when lying still. ED Course  Procedures  MDM Dr. Marry Guan states that this is a nonoperative case and that the patient be placed in a left knee immobilizer.  I explained this to the patient and her daughter.  The patient is at home by herself at this time and her husband is admitted to the hospital and will likely be going to rehab.  Patient also has a history of a stroke with left hand contracture.  2 doses of IV pain medicine here in the emergency department.  Will be admitted for acute fracture of the left knee with need for IV pain medications for pain control.  Signed out to Dr. Vella Kohler.        Orbie Pyo, MD 08/14/17 [phone]

## 2017-08-14 NOTE — ED Notes (Signed)
Admitting MD at bedside.

## 2017-08-14 NOTE — ED Provider Notes (Signed)
[facility] Emergency Department Provider Note    First MD Initiated Contact with Patient 08/14/17 1326     (approximate)  I have reviewed the triage vital signs and the nursing notes.   HISTORY  Chief Complaint Fall    HPI Vanessa [name] is a 83 y.o. female with a history of stroke with residual left-sided weakness presents after mechanical fall from home after her left leg "gave out" on her this morning.  Patient did fall and hit her head.  There is no LOC.  Denies any numbness or tingling.  No abdominal pain.  No chest pain or shortness of breath.  States the pain in her left knee is mild to moderate and worse with movement.  She is status post left hip replacement.  Past Medical History:  Diagnosis Date  . Colon cancer ([place])   . Diabetes mellitus without complication ([place])   . Stroke ([place])   . Thyroid disorder    No family history on file. Past Surgical History:  Procedure Laterality Date  . BACK SURGERY    . BREAST SURGERY    . COLON SURGERY    . TONSILLECTOMY     Patient Active Problem List   Diagnosis Date Noted  . TIA (transient ischemic attack) 06/23/2016  . Absolute anemia 05/30/2015  . B12 deficiency 05/30/2015  . Cerebral vascular accident ([place]) 05/30/2015  . Colon cancer ([place]) 05/30/2015  . Diabetes mellitus, type 2 ([place]) 05/30/2015  . HLD (hyperlipidemia) 05/30/2015  . Heart attack ([place]) 05/30/2015  . Adult hypothyroidism 05/30/2015  . Avitaminosis D 05/30/2015  . Enthesopathy of hip 01/05/2014  . Temporary cerebral vascular dysfunction 03/02/2012      Prior to Admission medications   Medication Sig Start Date End Date Taking? Authorizing Provider  acetaminophen (TYLENOL) 500 MG tablet Take 500 mg every 8 (eight) hours as needed by mouth for headache.   Yes [provider]  amLODipine (NORVASC) 5 MG tablet Take 5 mg by mouth every morning.    Yes [provider]  aspirin 325 MG tablet Take 1 tablet (325  mg total) by mouth daily. 06/25/16  Yes Dustin Flock, MD  atorvastatin (LIPITOR) 40 MG tablet Take 1 tablet (40 mg total) by mouth daily. 06/22/17 06/22/18 Yes Dustin Flock, MD  citalopram (CELEXA) 20 MG tablet Take 20 mg by mouth daily.   Yes [provider]  levETIRAcetam (KEPPRA) 500 MG tablet Take 1 tablet (500 mg total) by mouth 2 (two) times daily. 06/24/16  Yes Dustin Flock, MD  levothyroxine (SYNTHROID, LEVOTHROID) 75 MCG tablet Take 75 mcg by mouth daily before breakfast.   Yes [provider]    Allergies Benadryl [diphenhydramine hcl]; Codeine; Oxycodone; and Penicillins    Social History Social History   Tobacco Use  . Smoking status: Never Smoker  . Smokeless tobacco: Never Used  Substance Use Topics  . Alcohol use: No  . Drug use: No    Review of Systems Patient denies headaches, rhinorrhea, blurry vision, numbness, shortness of breath, chest pain, edema, cough, abdominal pain, nausea, vomiting, diarrhea, dysuria, fevers, rashes or hallucinations unless otherwise stated above in HPI. ____________________________________________   PHYSICAL EXAM:  VITAL SIGNS: Vitals:   08/14/17 1332  BP: 132/80  Pulse: 77  Resp: 16  Temp: 97.8 F (36.6 C)  SpO2: 99%    Constitutional: Alert and oriented. Well appearing and in no acute distress. Eyes: Conjunctivae are normal.  Head: Atraumatic. Nose: No congestion/rhinnorhea. Mouth/Throat: Mucous membranes are  moist.   Neck: No stridor. Painless ROM.  Cardiovascular: Normal rate, regular rhythm. Grossly normal heart sounds.  Good peripheral circulation. Respiratory: Normal respiratory effort.  No retractions. Lungs CTAB. Gastrointestinal: Soft and nontender. No distention. No abdominal bruits. No CVA tenderness. Musculoskeletal: There is a tender effusion the left knee.  No overlying cellulitis or warmth.  No obvious deformity.  Neurovascular intact distally.  No pain with logroll.  Compartments are  soft bilaterally.  No edema.  No joint effusions. Neurologic:  Normal speech and language. No gross focal neurologic deficits are appreciated. No facial droop Skin:  Skin is warm, dry and intact. No rash noted. Psychiatric: Mood and affect are normal. Speech and behavior are normal.  ____________________________________________   LABS (all labs ordered are listed, but only abnormal results are displayed)  No results found for this or any previous visit (from the past 24 hour(s)). ____________________________________________ ____________________________________________  [id number]  I personally reviewed all radiographic images ordered to evaluate for the above acute complaints and reviewed radiology reports and findings.  These findings were personally discussed with the patient.  Please see medical record for radiology report.  ____________________________________________   PROCEDURES  Procedure(s) performed:  Procedures    Critical Care performed: no ____________________________________________   INITIAL IMPRESSION / ASSESSMENT AND PLAN / ED COURSE  Pertinent labs & imaging results that were available during my care of the patient were reviewed by me and considered in my medical decision making (see chart for details).  DDX: fracture, contusion, dislocation, ligamentous injury, infectious process  DORIEN BESSENT is a 83 y.o. who presents to the ED with acute knee pain after mechanical fall.  Patient did hit her head therefore will order CT imaging to evaluate for traumatic injury.  As she is high risk by Nexus criteria will order CT imaging.  Will order x-ray imaging of the left leg to evaluate for evidence of fracture dislocation.  Clinical Course as of Aug 14 1605  Tue Aug 14, 2017  1548 Patient with evidence of mildly displaced tibial plateau fracture.  [PR]    Clinical Course User Index [PR] Merlyn Lot, MD   Patient will be signed out to Dr. Clearnce Hasten pending  follow-up with Dr. Marry Guan of orthopedics.  ____________________________________________   FINAL CLINICAL IMPRESSION(S) / ED DIAGNOSES  Final diagnoses:  Acute pain of left knee  Tibial plateau fracture, left, closed, initial encounter      NEW MEDICATIONS STARTED DURING THIS VISIT:  This SmartLink is deprecated. Use AVSMEDLIST instead to display the medication list for a patient.   Note:  This document was prepared using Dragon voice recognition software and may include unintentional dictation errors.    Merlyn Lot, MD 08/14/17 [phone]

## 2017-08-15 ENCOUNTER — Other Ambulatory Visit: Payer: Self-pay

## 2017-08-15 DIAGNOSIS — S82142A Displaced bicondylar fracture of left tibia, initial encounter for closed fracture: Secondary | ICD-10-CM | POA: Diagnosis not present

## 2017-08-15 LAB — GLUCOSE, CAPILLARY
GLUCOSE-CAPILLARY: 107 mg/dL — AB (ref 65–99)
Glucose-Capillary: 118 mg/dL — ABNORMAL HIGH (ref 65–99)
Glucose-Capillary: 146 mg/dL — ABNORMAL HIGH (ref 65–99)

## 2017-08-15 LAB — BASIC METABOLIC PANEL
ANION GAP: 6 (ref 5–15)
BUN: 32 mg/dL — ABNORMAL HIGH (ref 6–20)
CALCIUM: 8.7 mg/dL — AB (ref 8.9–10.3)
CHLORIDE: 107 mmol/L (ref 101–111)
CO2: 23 mmol/L (ref 22–32)
CREATININE: 1.35 mg/dL — AB (ref 0.44–1.00)
GFR calc non Af Amer: 35 mL/min — ABNORMAL LOW (ref >60)
GFR, EST AFRICAN AMERICAN: 41 mL/min — AB (ref >60)
Glucose, Bld: 125 mg/dL — ABNORMAL HIGH (ref 65–99)
Potassium: 3.8 mmol/L (ref 3.5–5.1)
SODIUM: 136 mmol/L (ref 135–145)

## 2017-08-15 MED ORDER — INSULIN ASPART 100 UNIT/ML ~~LOC~~ SOLN: 0.0000 [IU] | Freq: Three times a day (TID) | SUBCUTANEOUS | Status: DC

## 2017-08-15 MED ORDER — HYDROCODONE-ACETAMINOPHEN 5-325 MG PO TABS
1.0000 | ORAL_TABLET | Freq: Four times a day (QID) | ORAL | Status: DC | PRN
  Administered 2017-08-15: 1 via ORAL
  Filled 2017-08-15: qty 1

## 2017-08-15 MED ORDER — ENOXAPARIN SODIUM 30 MG/0.3ML ~~LOC~~ SOLN
30.0000 mg | SUBCUTANEOUS | Status: DC
  Administered 2017-08-15: 30 mg via SUBCUTANEOUS
  Filled 2017-08-15: qty 0.3

## 2017-08-15 NOTE — Care Management Note (Signed)
Case Management Note  Patient Details  Name: Vanessa [name] MRN: [id number] Date of Birth: 1931-04-13  Subjective/Objective:                   Met with patient to deliver and explain Medicare Observation letter.  She has been staying with her daughter [name] because her husband is admitted upstairs on telemetry unit.  She was using her walker- lost her balance and fell- fracturing her lower leg.  She has private duty nurse that comes 5 hours/day also. [facility] and has been to [facility] for rehab.  Action/Plan:   Home health list delivered to patient. Updated CSW.  No current payer for SNF/rehab. Recommend aggressive rehab while here in the hospital. Unfortunately, I wanted to shared this observation letter with her daughter betty but there was no answer.  I have left message   Expected Discharge Date:                  Expected Discharge Plan:     In-House Referral:     Discharge planning Services  CM Consult  Post Acute Care Choice:  Home Health, Durable Medical Equipment Choice offered to:  Patient  DME Arranged:    DME Agency:     HH Arranged:  PT, Nurse's Aide, Social Work [employer] Agency:     Status of Service:  In process, will continue to follow  If discussed at Long Length of Stay Meetings, dates discussed:    Additional Comments:  Marshell Garfinkel, RN 08/15/2017, 12:21 PM

## 2017-08-15 NOTE — Evaluation (Signed)
Occupational Therapy Evaluation Patient Details Name: Vanessa [name] MRN: [id number] DOB: 1931/08/09 Today's Date: 08/15/2017    History of Present Illness Pt is a 83 y/o F who presented following a fall, hitting her head, and with L knee pain.  X ray showed minimally depressed fx of medial tibial plateau.  Dr. Marry Guan consulted and decision was made for no surgery but pt is to wear knee immobilizer and follow PWB.  Pt's PMH includes colon cancer, stroke, back surgery.    Clinical Impression   OT evaluation completed this date. Pt pain limited, able to get meds soon per pt report. Pt currently limited in functional mobility and self care skills due to pain, decreased strength/ROM of RLE, impaired balance, impaired activity tolerance, and high falls risk. Hx of multiple falls. Pt lives with spouse but he is currently in the hospital. No 24/7 assist available. Mobility deferred due to pain. Pt would benefit from skilled OT services to address noted impairments and functional deficits in bathing, dressing, functional transfers, and other ADL tasks in order to maximize safety, minimize future falls risk, and maximize return to PLOF. Recommend transition to STR following hospitalization.    Follow Up Recommendations  SNF    Equipment Recommendations  3 in 1 bedside commode    Recommendations for Other Services       Precautions / Restrictions Precautions Precautions: Fall Required Braces or Orthoses: Knee Immobilizer - Left Knee Immobilizer - Left: Other (comment)(not specified, assumed at all times) Restrictions Weight Bearing Restrictions: Yes LLE Weight Bearing: Partial weight bearing(per verbal order from Dr. Marry Guan) LLE Partial Weight Bearing Percentage or Pounds: 50      Mobility Bed Mobility   General bed mobility comments: deferred, pt in too much pain  Transfers         General transfer comment: deferred, pt in too much pain    Balance                            ADL either performed or assessed with clinical judgement   ADL Overall ADL's : Needs assistance/impaired                                       General ADL Comments: generally min assist for UB ADL, max assist for LB ADL tasks, pain limited this date     Vision Baseline Vision/History: No visual deficits Patient Visual Report: No change from baseline       Perception     Praxis      Pertinent Vitals/Pain Pain Assessment: 0-10 Pain Score: 8  Faces Pain Scale: Hurts even more Pain Location: L knee Pain Descriptors / Indicators: Aching;Grimacing;Guarding;Moaning Pain Intervention(s): Limited activity within patient's tolerance;Monitored during session     Hand Dominance Right   Extremity/Trunk Assessment Upper Extremity Assessment Upper Extremity Assessment: LUE deficits/detail;RUE deficits/detail RUE Deficits / Details: Strength grossly 4/5 LUE Deficits / Details: Increased tone and contracture of L 3-5th fingers due to h/o stroke.  Strength grossly 3/5 at shoulder and elbow   Lower Extremity Assessment Lower Extremity Assessment: Defer to PT evaluation RLE Deficits / Details: Strength grossly 4/5 RLE LLE Deficits / Details: Unable to formally assess strength of R knee but hip strength grossly 3-/5, DF 3-/5 LLE: Unable to fully assess due to immobilization;Unable to fully assess due to pain   Cervical / Trunk Assessment  Cervical / Trunk Assessment: Kyphotic   Communication Communication Communication: No difficulties   Cognition Arousal/Alertness: Awake/alert Behavior During Therapy: WFL for tasks assessed/performed;Anxious Overall Cognitive Status: Within Functional Limits for tasks assessed                                     General Comments      Exercises   Shoulder Instructions      Home Living Family/patient expects to be discharged to:: Skilled nursing facility Living Arrangements: Spouse/significant  other;Alone(husband is in hospital right now) Available Help at Discharge: Family;Personal care attendant;Available PRN/intermittently Type of Home: House Home Access: Ramped entrance     Home Layout: One level     Bathroom Shower/Tub: Occupational psychologist: Handicapped height     Home Equipment: Grab bars - tub/shower;Hand held shower head;Shower seat;Walker - 2 wheels;Wheelchair - Education administrator (comment)(platform for walker)          Prior Functioning/Environment Level of Independence: Needs assistance  Gait / Transfers Assistance Needed: Ambulates up to 5 ft with RW grasping RW with L index finger and thumb only.  Pt reports 5 falls in the past 6 months.  Does not use WC in home but has someone push her in [facility] for longer distances.   ADL's / Homemaking Assistance Needed: Has a personal care aide who comes MWF for 5 hrs who assists with cooking, cleaning, bathing (pt gets in the shower to bathe with assist).  Pt able to dress independently and does some light cooking.  Husband is the driver but he is currently at the hospital.  Pt endorses             OT Problem List: Decreased strength;Decreased knowledge of use of DME or AE;Decreased range of motion;Decreased activity tolerance;Pain;Impaired balance (sitting and/or standing)      OT Treatment/Interventions: Self-care/ADL training;Therapeutic exercise;Therapeutic activities;DME and/or AE instruction    OT Goals(Current goals can be found in the care plan section) Acute Rehab OT Goals Patient Stated Goal: decreased pain OT Goal Formulation: With patient Time For Goal Achievement: 08/29/17 Potential to Achieve Goals: Good ADL Goals Pt Will Perform Lower Body Dressing: with set-up;sit to/from stand;with min guard assist;with adaptive equipment Pt Will Transfer to Toilet: with min guard assist;ambulating(LRAD for ambulation, BSC over toilet)  OT Frequency: Min 1X/week   Barriers to D/C: Decreased caregiver  support          Co-evaluation              AM-PAC PT "6 Clicks" Daily Activity     Outcome Measure Help from another person eating meals?: A Little Help from another person taking care of personal grooming?: A Little Help from another person toileting, which includes using toliet, bedpan, or urinal?: A Lot Help from another person bathing (including washing, rinsing, drying)?: A Lot Help from another person to put on and taking off regular upper body clothing?: A Little Help from another person to put on and taking off regular lower body clothing?: A Lot 6 Click Score: 15   End of Session    Activity Tolerance: Patient limited by pain Patient left: in bed;with call bell/phone within reach;with bed alarm set  OT Visit Diagnosis: Other abnormalities of gait and mobility (R26.89)                Time: 6644-0347 OT Time Calculation (min): 22 min Charges:  OT General Charges $  OT Visit: 1 Visit OT Evaluation $OT Eval Low Complexity: 1 Low G-Codes: OT G-codes **NOT FOR INPATIENT CLASS** Functional Assessment Tool Used: AM-PAC 6 Clicks Daily Activity;Clinical judgement Functional Limitation: Self care Self Care Current Status (L8937): At least 40 percent but less than 60 percent impaired, limited or restricted Self Care Goal Status (D4287): At least 20 percent but less than 40 percent impaired, limited or restricted   Jeni Salles, MPH, MS, OTR/L ascom [phone] 08/15/17, 1:56 PM

## 2017-08-15 NOTE — Clinical Social Work Note (Signed)
Clinical Social Work Assessment  Patient Details  Name: Vanessa [name] MRN: [id number] Date of Birth: 03/10/1931  Date of referral:  08/15/17               Reason for consult:  Facility Placement                Permission sought to share information with:    Permission granted to share information::     Name::        Agency::     Relationship::     Contact Information:     Housing/Transportation Living arrangements for the past 2 months:  Single Family Home Source of Information:  Patient Patient Interpreter Needed:  None Criminal Activity/Legal Involvement Pertinent to Current Situation/Hospitalization:  No - Comment as needed Significant Relationships:  Adult Children, Spouse Lives with:  Spouse Do you feel safe going back to the place where you live?  Yes Need for family participation in patient care:  Yes (Comment)  Care giving concerns:  Patient lives in [place] with her husband Vanessa [name] however he husband is currently admitted to [facility].    Social Worker assessment / plan:  Holiday representative (CSW) received verbal consult from PT that recommendation is SNF. CSW met with patient alone at bedside to discuss D/C plan. Patient was alert and oriented X4 and was laying in the bed. CSW introduced self and explained role of CSW department. Patient reported that she normally lives with her husband in [place] however he is at [facility] admitted to the second floor. CSW explained that patient's medicare will not pay for SNF because she is under observation. CSW discussed private pay options for SNF. Patient reported that she can't pay privately for SNF and she will have to go home. RN case Freight forwarder, MD, RN and PT aware of above. CSW will continue to follow and assist as needed.   Employment status:  Disabled (Comment on whether or not currently receiving Disability), Retired Forensic scientist:  Medicare PT Recommendations:  [place] / Referral to community  resources:  Other (Comment Required)(Patient is under medicare observation and can't pay privately for SNF. )  Patient/Family's Response to care:  Patient reported that she can't pay privately for SNF.   Patient/Family's Understanding of and Emotional Response to Diagnosis, Current Treatment, and Prognosis:  Patient was pleasant and thanked CSW for visit.   Emotional Assessment Appearance:  Appears stated age Attitude/Demeanor/Rapport:    Affect (typically observed):  Accepting, Adaptable, Pleasant Orientation:  Oriented to Self, Oriented to Place, Oriented to  Time, Oriented to Situation Alcohol / Substance use:  Not Applicable Psych involvement (Current and /or in the community):  No (Comment)  Discharge Needs  Concerns to be addressed:  Discharge Planning Concerns Readmission within the last 30 days:  No Current discharge risk:  Dependent with Mobility Barriers to Discharge:  Continued Medical Work up   [employer], Vanessa Beets, LCSW 08/15/2017, 4:04 PM

## 2017-08-15 NOTE — Care Management Obs Status (Signed)
MEDICARE OBSERVATION STATUS NOTIFICATION   Patient Details  Name: Vanessa [name] MRN: [id number] Date of Birth: 11-03-1930   Medicare Observation Status Notification Given:  Yes    Marshell Garfinkel, RN 08/15/2017, 12:26 PM

## 2017-08-15 NOTE — Progress Notes (Signed)
Received call from Dr. Marry Guan at 15:15 on 11/14 with updated orders that pt is now NWB LLE.    Collie Siad PT, DPT

## 2017-08-15 NOTE — Progress Notes (Signed)
Order for enoxaparin 40 mg subcutaneously daily changed to enoxaparin 30 mg daily dose per protocol for CrCl < 30 mL/min.  Lenis Noon, PharmD 08/15/17 10:53 AM

## 2017-08-15 NOTE — Evaluation (Signed)
Physical Therapy Evaluation Patient Details Name: Vanessa [name] MRN: [id number] DOB: [birth year] Today's Date: 08/15/2017   History of Present Illness  Pt is a 83 y/o F who presented following a fall, hitting her head, and with L knee pain.  X ray showed minimally depressed fx of medial tibial plateau.  Dr. Marry Guan consulted and decision was made for no surgery but pt is to wear knee immobilizer and follow PWB.  Pt's PMH includes colon cancer, stroke, back surgery.     Clinical Impression  Pt currently with functional limitations due to the deficits listed below (see PT Problem List). At baseline pt ambulating up to 5 ft with RW, otherwise has someone push her in her WC.  She has a personal care aide who comes MWF to assist with bathing, cooking, cleaning.  Pt required mod assist for bed mobility and max assist for sit<>stand.  Pt says to this therapist, "If I had known I would be 88 and have this injury, I would have committed suicide."  RN notified. Pt provided with encouragement throughout session and pt appears motivated, attempting sit<>stand x6.  Given pt's current mobility status, recommending SNF at d/c.   Pt will benefit from skilled PT to increase their independence and safety with mobility to allow discharge to the venue listed below.      Follow Up Recommendations SNF    Equipment Recommendations  None recommended by PT    Recommendations for Other Services       Precautions / Restrictions Precautions Precautions: Fall Required Braces or Orthoses: Knee Immobilizer - Left Knee Immobilizer - Left: Other (comment)(Not specified, assumed at all times) Restrictions Weight Bearing Restrictions: Yes LLE Weight Bearing: Partial weight bearing(per verbal order from Dr. Marry Guan) LLE Partial Weight Bearing Percentage or Pounds: 50      Mobility  Bed Mobility Overal bed mobility: Needs Assistance Bed Mobility: Supine to Sit;Sit to Supine     Supine to sit: Mod assist;HOB  elevated Sit to supine: +2 for physical assistance;Max assist   General bed mobility comments: Cues for sequencing and pt relies heavily on bed rail with assist to elevate trunk and to manage LLE.  Pt able to scoot to EOB with increased time.   Transfers Overall transfer level: Needs assistance Equipment used: Left platform walker Transfers: Sit to/from Stand Sit to Stand: Max assist;From elevated surface         General transfer comment: Elevated bed for ease and improved mechanical advantage.  Max verbal cues and encouragement as on each attempt for sit>stand the pt says, "I can't do it". With each attempt (performed x6) the pt improved and progressed to being able to slide LLE back even with RLE in partial standing.  Pt unable to stand upright due to fear of falling and weakness.  She required max assist to boost to standing and to control descent to sit.   Ambulation/Gait             General Gait Details: Unable to assess  Stairs            Wheelchair Mobility    Modified Rankin (Stroke Patients Only)       Balance Overall balance assessment: Needs assistance;History of Falls Sitting-balance support: No upper extremity supported;Feet supported Sitting balance-Leahy Scale: Good     Standing balance support: Bilateral upper extremity supported;During functional activity Standing balance-Leahy Scale: Poor Standing balance comment: Pt relies on UE support for sit<>partial standing  Pertinent Vitals/Pain Pain Assessment: Faces Faces Pain Scale: Hurts even more Pain Location: L knee Pain Descriptors / Indicators: Aching;Grimacing;Guarding;Moaning Pain Intervention(s): Limited activity within patient's tolerance;Monitored during session;Repositioned;Patient requesting pain meds-RN notified    Home Living Family/patient expects to be discharged to:: Skilled nursing facility Living Arrangements: Alone(Husband in hospital and  will d/c to SNF) Available Help at Discharge: Family;Personal care attendant;Available PRN/intermittently Type of Home: House Home Access: Ramped entrance     Home Layout: One level Home Equipment: Grab bars - tub/shower;Hand held shower head;Shower seat;Walker - 2 wheels;Wheelchair - [employer] for [employer])      Prior Function Level of Independence: Needs assistance   Gait / Transfers Assistance Needed: Ambualtes up to 5 ft with RW grasping RW with L index finger and thumb only.  Pt reports 5 falls in the past 6 months.  Does not use WC in home but has someone push her in [facility] for longer distances.    ADL's / Homemaking Assistance Needed: Has a personal care aide who comes MWF for 5 hrs who assists with cooking, cleaning, bathing (pt gets in the shower to bathe with assist).  Pt able to dress independently and does some light cooking.  Husband is the driver but he is currently at the hospital.         Hand Dominance   Dominant Hand: Right    Extremity/Trunk Assessment   Upper Extremity Assessment Upper Extremity Assessment: LUE deficits/detail;RUE deficits/detail RUE Deficits / Details: Strength grossly 4/5 LUE Deficits / Details: Increased tone and contracture of L fingers due to h/o stroke.  Strength grossly 3/5 at shoulder and elbow    Lower Extremity Assessment Lower Extremity Assessment: RLE deficits/detail;LLE deficits/detail RLE Deficits / Details: Strength grossly 4/5 RLE LLE Deficits / Details: Unable to formally assess strength of R knee but hip strength grossly 3-/5, DF 3-/5 LLE: Unable to fully assess due to immobilization;Unable to fully assess due to pain    Cervical / Trunk Assessment Cervical / Trunk Assessment: Kyphotic  Communication   Communication: No difficulties  Cognition Arousal/Alertness: Awake/alert Behavior During Therapy: WFL for tasks assessed/performed;Anxious Overall Cognitive Status: Within Functional Limits for tasks assessed                                         General Comments General comments (skin integrity, edema, etc.): Pt says to this therapist, "If I had known I would be 40 and have this injury, I would have committed suicide."  RN notified. Pt provided with encouragement throughout session and pt appears motivated, attempting sit<>stand x6.     Exercises General Exercises - Lower Extremity Ankle Circles/Pumps: AROM;Both;10 reps;Supine Hip ABduction/ADduction: Strengthening;Left;10 reps;Supine;AAROM Straight Leg Raises: Strengthening;AAROM;Left;10 reps;Supine   Assessment/Plan    PT Assessment Patient needs continued PT services  PT Problem List Decreased strength;Decreased range of motion;Decreased activity tolerance;Decreased balance;Decreased mobility;Decreased knowledge of use of DME;Decreased safety awareness;Pain       PT Treatment Interventions DME instruction;Gait training;Functional mobility training;Stair training;Therapeutic activities;Therapeutic exercise;Balance training;Neuromuscular re-education;Patient/family education;Wheelchair mobility training;Modalities    PT Goals (Current goals can be found in the Care Plan section)  Acute Rehab PT Goals Patient Stated Goal: decreased pain PT Goal Formulation: With patient Potential to Achieve Goals: Good    Frequency 7X/week   Barriers to discharge Decreased caregiver support Pt with only intermittent assist avaialble at discharge from combination of family and personal care provider  Co-evaluation               AM-PAC PT "6 Clicks" Daily Activity  Outcome Measure Difficulty turning over in bed (including adjusting bedclothes, sheets and blankets)?: Unable Difficulty moving from lying on back to sitting on the side of the bed? : Unable Difficulty sitting down on and standing up from a chair with arms (e.g., wheelchair, bedside commode, etc,.)?: Unable Help needed moving to and from a bed to chair (including a  wheelchair)?: Total Help needed walking in hospital room?: Total Help needed climbing 3-5 steps with a railing? : Total 6 Click Score: 6    End of Session Equipment Utilized During Treatment: Gait belt;Left knee immobilizer Activity Tolerance: Patient tolerated treatment well;Patient limited by fatigue;Patient limited by pain Patient left: in bed;with call bell/phone within reach;with bed alarm set;with nursing/sitter in room Nurse Communication: Mobility status;Weight bearing status PT Visit Diagnosis: Pain;Muscle weakness (generalized) (M62.81);Unsteadiness on feet (R26.81);Difficulty in walking, not elsewhere classified (R26.2) Pain - Right/Left: Left Pain - part of body: Knee    Time: 6734-1937 PT Time Calculation (min) (ACUTE ONLY): 44 min   Charges:   PT Evaluation $PT Eval Moderate Complexity: 1 Mod PT Treatments $Therapeutic Activity: 23-37 mins   PT G Codes:   PT G-Codes **NOT FOR INPATIENT CLASS** Functional Assessment Tool Used: AM-PAC 6 Clicks Basic Mobility;Clinical judgement Functional Limitation: Mobility: Walking and moving around Mobility: Walking and Moving Around Current Status (T0240): 100 percent impaired, limited or restricted Mobility: Walking and Moving Around Goal Status (X7353): At least 60 percent but less than 80 percent impaired, limited or restricted    Collie Siad PT, DPT 08/15/2017, 1:33 PM

## 2017-08-15 NOTE — NC FL2 (Signed)
[place] LEVEL OF CARE SCREENING TOOL     IDENTIFICATION  Patient Name: Vanessa [name] Birthdate: Mar 26, 1931 Sex: female Admission Date (Current Location): 08/14/2017  [place] and [place] Number:  Engineering geologist and Address:  [facility], [address], [place], [place] [address]      Provider Number: [id number]  Attending Physician Name and Address:  Bettey Costa, MD  Relative Name and Phone Number:       Current Level of Care: Hospital Recommended Level of Care: [place] Prior Approval Number:    Date Approved/Denied:   PASRR Number: ([id number] )  Discharge Plan: SNF    Current Diagnoses: Patient Active Problem List   Diagnosis Date Noted  . Closed tibia fracture 08/14/2017  . TIA (transient ischemic attack) 06/23/2016  . Absolute anemia 05/30/2015  . B12 deficiency 05/30/2015  . Cerebral vascular accident ([place]) 05/30/2015  . Colon cancer ([place]) 05/30/2015  . Diabetes mellitus, type 2 ([place]) 05/30/2015  . HLD (hyperlipidemia) 05/30/2015  . Heart attack ([place]) 05/30/2015  . Adult hypothyroidism 05/30/2015  . Avitaminosis D 05/30/2015  . Enthesopathy of hip 01/05/2014  . Temporary cerebral vascular dysfunction 03/02/2012    Orientation RESPIRATION BLADDER Height & Weight     Self, Time, Situation, Place  Normal Continent Weight: 150 lb (68 kg) Height:  5\' 5"  (165.1 cm)  BEHAVIORAL SYMPTOMS/MOOD NEUROLOGICAL BOWEL NUTRITION STATUS      Continent Diet(Diet: Heart Healthy )  AMBULATORY STATUS COMMUNICATION OF NEEDS Skin   Extensive Assist Verbally Normal                       Personal Care Assistance Level of Assistance  Bathing, Feeding, Dressing Bathing Assistance: Limited assistance Feeding assistance: Independent Dressing Assistance: Limited assistance     Functional Limitations Info  Sight, Hearing, Speech Sight Info: Adequate Hearing Info: Adequate Speech Info: Adequate     SPECIAL CARE FACTORS FREQUENCY  PT (By licensed PT), OT (By licensed OT)     PT Frequency: (5) OT Frequency: (5)            Contractures      Additional Factors Info  Code Status, Allergies Code Status Info: (Full Code. ) Allergies Info: (Benadryl Diphenhydramine Hcl, Codeine, Oxycodone, Penicillins)           Current Medications (08/15/2017):  This is the current hospital active medication list Current Facility-Administered Medications  Medication Dose Route Frequency Provider Last Rate Last Dose  . 0.9 %  sodium chloride infusion  250 mL Intravenous PRN Demetrios Loll, MD      . 0.9 %  sodium chloride infusion   Intravenous Continuous Demetrios Loll, MD      . acetaminophen (TYLENOL) tablet 650 mg  650 mg Oral Q6H PRN Demetrios Loll, MD       Or  . acetaminophen (TYLENOL) suppository 650 mg  650 mg Rectal Q6H PRN Demetrios Loll, MD      . albuterol (PROVENTIL) (2.5 MG/3ML) 0.083% nebulizer solution 2.5 mg  2.5 mg Nebulization Q2H PRN Demetrios Loll, MD      . amLODipine (NORVASC) tablet 5 mg  5 mg Oral [name], Sheppard Evens, MD      . aspirin EC tablet 325 mg  325 mg Oral Daily Demetrios Loll, MD   325 mg at 08/15/17 5366  . atorvastatin (LIPITOR) tablet 40 mg  40 mg Oral QPM Demetrios Loll, MD   40 mg at 08/14/17 2101  .  bisacodyl (DULCOLAX) EC tablet 5 mg  5 mg Oral Daily PRN Demetrios Loll, MD      . citalopram (CELEXA) tablet 20 mg  20 mg Oral QPM Demetrios Loll, MD   20 mg at 08/14/17 2102  . enoxaparin (LOVENOX) injection 30 mg  30 mg Subcutaneous Q24H Lenis Noon, [facility]      . fentaNYL (SUBLIMAZE) injection 12.5 mcg  12.5 mcg Intravenous Q1H PRN Merlyn Lot, MD   12.5 mcg at 08/14/17 2259  . insulin aspart (novoLOG) injection 0-9 Units  0-9 Units Subcutaneous TID WC Mody, Sital, MD      . ketorolac (TORADOL) 15 MG/ML injection 15 mg  15 mg Intravenous Q6H PRN Demetrios Loll, MD   15 mg at 08/15/17 1232  . levETIRAcetam (KEPPRA) tablet 500 mg  500 mg Oral BID Demetrios Loll, MD   500 mg at 08/15/17 2637   . levothyroxine (SYNTHROID, LEVOTHROID) tablet 75 mcg  75 mcg Oral QAC breakfast Demetrios Loll, MD   75 mcg at 08/15/17 0510  . ondansetron (ZOFRAN) tablet 4 mg  4 mg Oral Q6H PRN Demetrios Loll, MD       Or  . ondansetron [facility]) injection 4 mg  4 mg Intravenous Q6H PRN Demetrios Loll, MD      . senna-docusate (Senokot-S) tablet 1 tablet  1 tablet Oral QHS PRN Demetrios Loll, MD      . sodium chloride flush (NS) 0.9 % injection 3 mL  3 mL Intravenous Q12H Demetrios Loll, MD   3 mL at 08/15/17 1136  . sodium chloride flush (NS) 0.9 % injection 3 mL  3 mL Intravenous PRN Demetrios Loll, MD         Discharge Medications: Please see discharge summary for a list of discharge medications.  Relevant Imaging Results:  Relevant Lab Results:   Additional Information (SSN: [id number])  Darey Hershberger, Veronia Beets, LCSW

## 2017-08-15 NOTE — Progress Notes (Signed)
[place] [facility] [place] NAME: Vanessa [name]    MR#:  [id number]  DATE OF BIRTH:  1930-12-19  SUBJECTIVE:   Patient presents with accidental fall and has suffered a non operative tibial  plateau fx  REVIEW OF SYSTEMS:    Review of Systems  Constitutional: Negative for fever, chills weight loss HENT: Negative for ear pain, nosebleeds, congestion, facial swelling, rhinorrhea, neck pain, neck stiffness and ear discharge.   Respiratory: Negative for cough, shortness of breath, wheezing  Cardiovascular: Negative for chest pain, palpitations and leg swelling.  Gastrointestinal: Negative for heartburn, abdominal pain, vomiting, diarrhea or consitpation Genitourinary: Negative for dysuria, urgency, frequency, hematuria Musculoskeletal: Negative for back pain ++leg pain Neurological: Negative for dizziness, seizures, syncope, focal weakness,  numbness and headaches.  Hematological: Does not bruise/bleed easily.  Psychiatric/Behavioral: Negative for hallucinations, confusion, dysphoric mood    Tolerating Diet:yes      DRUG ALLERGIES:   Allergies  Allergen Reactions  . Benadryl [Diphenhydramine Hcl] Other (See Comments)    Pt states that it makes her legs jump.   . Codeine Nausea And Vomiting  . Oxycodone Nausea And Vomiting and Other (See Comments)    "Makes me real irritable and I feel terrible."  . Penicillins Other (See Comments)    Pt states that it does not work for her.   Has patient had a PCN reaction causing immediate rash, facial/tongue/throat swelling, SOB or lightheadedness with hypotension: No Has patient had a PCN reaction causing severe rash involving mucus membranes or skin necrosis: No Has patient had a PCN reaction that required hospitalization: No Has patient had a PCN reaction occurring within the last 10 years: No If all of the above answers are "NO", then may proceed with Cephalosporin use.     VITALS:  Blood pressure  (!) 103/42, pulse 87, temperature 98.4 F (36.9 C), temperature source Oral, resp. rate 18, height 5\' 5"  (1.651 m), weight 68 kg (150 lb), SpO2 94 %.  PHYSICAL EXAMINATION:  Constitutional: Appears well-developed and well-nourished. No distress. HENT: Normocephalic. Marland Kitchen Oropharynx is clear and moist.  Eyes: Conjunctivae and EOM are normal. PERRLA, no scleral icterus.  Neck: Normal ROM. Neck supple. No JVD. No tracheal deviation. CVS: RRR, S1/S2 +, no murmurs, no gallops, no carotid bruit.  Pulmonary: Effort and breath sounds normal, no stridor, rhonchi, wheezes, rales.  Abdominal: Soft. BS +,  no distension, tenderness, rebound or guarding.  Musculoskeletal: she has brace placed left leg , left hand contracture  Neuro: Alert. CN 2-12 grossly intact. No focal deficits. Skin: Skin is warm and dry. No rash noted. Psychiatric: Normal mood and affect.      LABORATORY PANEL:   CBC Recent Labs  Lab 08/14/17 1639  WBC 8.6  HGB 11.7*  HCT 32.6*  PLT 223   ------------------------------------------------------------------------------------------------------------------  Chemistries  Recent Labs  Lab 08/15/17 0520  NA 136  K 3.8  CL 107  CO2 23  GLUCOSE 125*  BUN 32*  CREATININE 1.35*  CALCIUM 8.7*   ------------------------------------------------------------------------------------------------------------------  Cardiac Enzymes No results for input(s): TROPONINI in the last 168 hours. ------------------------------------------------------------------------------------------------------------------  RADIOLOGY:  Ct Head Wo Contrast  Result Date: 08/14/2017 CLINICAL DATA:  [place] to the floor after acute onset of left leg weakness. Head struck table. EXAM: CT HEAD WITHOUT CONTRAST CT CERVICAL SPINE WITHOUT CONTRAST TECHNIQUE: Multidetector CT imaging of the head and cervical spine was performed following the standard protocol without intravenous contrast. Multiplanar CT image  reconstructions of the  cervical spine were also generated. COMPARISON:  CT and MRI 06/29/2017 FINDINGS: CT HEAD FINDINGS Brain: No acute finding. Generalized brain atrophy. Chronic small-vessel ischemic changes of the hemispheric white matter. Old infarction in the right basal ganglia region. No sign of acute infarction, mass lesion, hemorrhage, hydrocephalus or extra-axial collection. Vascular: No abnormal vascular finding. Skull: No skull fracture. Sinuses/Orbits: Clear/normal Other: None CT CERVICAL SPINE FINDINGS Alignment: Exaggerated cervical lordosis. 2 mm degenerative anterolisthesis C4-5. 2 mm degenerative anterolisthesis C6-7 and C7-T1. Skull base and vertebrae: No fracture. Soft tissues and spinal canal: No significant soft tissue finding. Disc levels: Degenerative spondylosis throughout the cervical spine with disc space narrowing most pronounced at C2-3, C3-4 and C5-6. Facet osteoarthritis throughout the cervical region. No evidence of severe bony canal stenosis. Mild to moderate bony foraminal narrowing throughout the cervical region. Upper chest: Benign appearing scarring. Other: None IMPRESSION: Head CT: No acute or traumatic finding. Old white matter ischemic changes an old right basal ganglia region infarction. Cervical spine CT: No acute or traumatic finding. Chronic degenerative changes throughout the cervical region as outlined above. Electronically Signed   By: Nelson Chimes M.D.   On: 08/14/2017 14:28   Ct Cervical Spine Wo Contrast  Result Date: 08/14/2017 CLINICAL DATA:  [place] to the floor after acute onset of left leg weakness. Head struck table. EXAM: CT HEAD WITHOUT CONTRAST CT CERVICAL SPINE WITHOUT CONTRAST TECHNIQUE: Multidetector CT imaging of the head and cervical spine was performed following the standard protocol without intravenous contrast. Multiplanar CT image reconstructions of the cervical spine were also generated. COMPARISON:  CT and MRI 06/29/2017 FINDINGS: CT HEAD  FINDINGS Brain: No acute finding. Generalized brain atrophy. Chronic small-vessel ischemic changes of the hemispheric white matter. Old infarction in the right basal ganglia region. No sign of acute infarction, mass lesion, hemorrhage, hydrocephalus or extra-axial collection. Vascular: No abnormal vascular finding. Skull: No skull fracture. Sinuses/Orbits: Clear/normal Other: None CT CERVICAL SPINE FINDINGS Alignment: Exaggerated cervical lordosis. 2 mm degenerative anterolisthesis C4-5. 2 mm degenerative anterolisthesis C6-7 and C7-T1. Skull base and vertebrae: No fracture. Soft tissues and spinal canal: No significant soft tissue finding. Disc levels: Degenerative spondylosis throughout the cervical spine with disc space narrowing most pronounced at C2-3, C3-4 and C5-6. Facet osteoarthritis throughout the cervical region. No evidence of severe bony canal stenosis. Mild to moderate bony foraminal narrowing throughout the cervical region. Upper chest: Benign appearing scarring. Other: None IMPRESSION: Head CT: No acute or traumatic finding. Old white matter ischemic changes an old right basal ganglia region infarction. Cervical spine CT: No acute or traumatic finding. Chronic degenerative changes throughout the cervical region as outlined above. Electronically Signed   By: Nelson Chimes M.D.   On: 08/14/2017 14:28   Ct Knee Left Wo Contrast  Result Date: 08/14/2017 CLINICAL DATA:  Possible medial tibial plateau fracture. EXAM: CT OF THE LEFT KNEE WITHOUT CONTRAST TECHNIQUE: Multidetector CT imaging of the left knee was performed according to the standard protocol. Multiplanar CT image reconstructions were also generated. COMPARISON:  Left knee x-rays from same day. FINDINGS: Bones/Joint/Cartilage There is a minimally depressed fracture of the medial tibial plateau. There is a moderate lipohemarthrosis. Minimal tricompartmental degenerative changes.  Diffuse osteopenia. Ligaments Suboptimally assessed by CT.  Muscles and Tendons No focal abnormality.  The extensor mechanism is intact. Soft tissues Unremarkable. IMPRESSION: 1. Minimally depressed fracture of the medial tibial plateau. 2. Moderate lipohemarthrosis. Electronically Signed   By: Titus Dubin M.D.   On: 08/14/2017 15:36   Dg Chest  Port 1 View  Result Date: 08/14/2017 CLINICAL DATA:  83 year old female with cough. EXAM: PORTABLE CHEST 1 VIEW COMPARISON:  Chest radiograph dated 06/23/2016 FINDINGS: There is emphysematous changes of the lungs. No focal consolidation, pleural effusion, or pneumothorax. The cardiac silhouette is within normal limits. There is atherosclerotic calcification of the aortic arch. No acute osseous pathology. Old healed left posterior rib fractures noted. IMPRESSION: 1. No acute cardiopulmonary process. 2. Emphysema. Electronically Signed   By: Anner Crete M.D.   On: 08/14/2017 20:20   Dg Knee Complete 4 Views Left  Result Date: 08/14/2017 CLINICAL DATA:  Patient fell when arising from a chair when the left leg gave out. Patient ports left knee and hip pain. EXAM: LEFT KNEE - COMPLETE 4+ VIEW COMPARISON:  None in PACs FINDINGS: The bones are subjectively osteopenic. There is subtle irregularity of the contour of the cortex of the proximal tibia at the level of the medial tibial plateau. This may reflect a non depressed or minimally depressed tibial plateau fracture. The lateral tibial plateau appears intact as do the femoral condyles. There is no patellar fracture. The proximal fibula is intact. There is mild narrowing of the medial and lateral joint spaces. IMPRESSION: Possible non depressed or minimally depressed fracture of the medial tibial plateau. Further evaluation with CT scanning is recommended. Electronically Signed   By: David  Martinique M.D.   On: 08/14/2017 14:38   Dg Hip Unilat W Or Wo Pelvis 2-3 Views Left  Result Date: 08/14/2017 CLINICAL DATA:  Hip pain EXAM: DG HIP (WITH OR WITHOUT PELVIS) 2-3V LEFT  COMPARISON:  05/21/2016 FINDINGS: Osteopenia. Surgical rod in the right femur. Pubic symphysis is intact. There is no fracture or dislocation. There are mild degenerative changes. IMPRESSION: No acute osseous abnormality.  Osteopenia. Electronically Signed   By: Donavan Foil M.D.   On: 08/14/2017 14:37     ASSESSMENT AND PLAN:    83 year old female with a history of previous stroke with residual left-sided contracture and diabetes who presents after mechanical fall and has suffered a minimally displaced medial tibial fracture  1. Minimally displaced fracture of medial tibial plateau, left leg: Await physical therapy consultation Await orthopedic surgery consultation (apparently this is nonoperable) Continue when necessary pain management Continue with knee immobilizer  2. Diet controlled diabetes: Order sliding scale  3. History of CVA with left hand contracture: Continue aspirin and statin  4. Hypothyroidism: Continue Synthroid  5. Depression: Continue Celexa  6. Mild acute kidney injury: Hold nephrotoxic medications. Continue IV fluids and recheck BMP in a.m.  Management plans discussed with the patient and she is in agreement.  CODE STATUS: FULL  TOTAL TIME TAKING CARE OF THIS PATIENT: 30 minutes.     POSSIBLE D/C tomorrow, DEPENDING ON CLINICAL CONDITION.   Mory Herrman M.D on 08/15/2017 at 8:25 AM  Between 7am to 6pm - Pager - [phone] After 6pm go to [email or web address] - password [email or web address] [facility]  Office  [phone]  CC: Primary care physician; Herminio Commons, MD  Note: This dictation was prepared with Dragon dictation along with smaller phrase technology. Any transcriptional errors that result from this process are unintentional.

## 2017-08-16 DIAGNOSIS — F4325 Adjustment disorder with mixed disturbance of emotions and conduct: Secondary | ICD-10-CM | POA: Diagnosis not present

## 2017-08-16 DIAGNOSIS — S82142A Displaced bicondylar fracture of left tibia, initial encounter for closed fracture: Secondary | ICD-10-CM | POA: Diagnosis not present

## 2017-08-16 LAB — BASIC METABOLIC PANEL
Anion gap: 5 (ref 5–15)
BUN: 36 mg/dL — AB (ref 6–20)
CALCIUM: 8.6 mg/dL — AB (ref 8.9–10.3)
CHLORIDE: 108 mmol/L (ref 101–111)
CO2: 23 mmol/L (ref 22–32)
CREATININE: 1.11 mg/dL — AB (ref 0.44–1.00)
GFR calc non Af Amer: 44 mL/min — ABNORMAL LOW (ref >60)
GFR, EST AFRICAN AMERICAN: 51 mL/min — AB (ref >60)
GLUCOSE: 113 mg/dL — AB (ref 65–99)
Potassium: 3.8 mmol/L (ref 3.5–5.1)
Sodium: 136 mmol/L (ref 135–145)

## 2017-08-16 LAB — GLUCOSE, CAPILLARY
GLUCOSE-CAPILLARY: 118 mg/dL — AB (ref 65–99)
Glucose-Capillary: 109 mg/dL — ABNORMAL HIGH (ref 65–99)

## 2017-08-16 MED ORDER — HYDROCODONE-ACETAMINOPHEN 5-325 MG PO TABS: 1.0000 | ORAL_TABLET | Freq: Four times a day (QID) | ORAL | 0 refills | Status: DC | PRN

## 2017-08-16 MED ORDER — SENNOSIDES-DOCUSATE SODIUM 8.6-50 MG PO TABS: 1.0000 | ORAL_TABLET | Freq: Every evening | ORAL | Status: AC | PRN
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
## 2017-08-16 NOTE — Care Management (Signed)
Pt is recommending left leg elevated leg rest for home wheelchair. I have requested this from [place] [facility] and notified Tim with [facility] of this assessment/need.

## 2017-08-16 NOTE — Discharge Summary (Signed)
[facility] [place] NAME: Vanessa [name]    MR#:  [id number]  DATE OF BIRTH:  1931/08/24  DATE OF ADMISSION:  08/14/2017 ADMITTING PHYSICIAN: Demetrios Loll, MD  DATE OF DISCHARGE: 08/16/2017  PRIMARY CARE PHYSICIAN: Herminio Commons, MD    ADMISSION DIAGNOSIS:  Tibial plateau fracture, left, closed, initial encounter [S82.142A] Acute pain of left knee [M25.562]  DISCHARGE DIAGNOSIS:  Active Problems:   Closed tibia fracture   SECONDARY DIAGNOSIS:   Past Medical History:  Diagnosis Date  . Colon cancer ([place])   . Diabetes mellitus without complication ([place])   . Stroke ([place])   . Thyroid disorder     HOSPITAL COURSE:   83 year old female with a history of previous stroke with residual left-sided contracture and diabetes who presents after mechanical fall and has suffered a minimally displaced medial tibial fracture  1. Minimally displaced fracture of medial tibial plateau, left leg: She was evaluated by [facility] and this is non operable. She should be non weightbearing left leg. She was under OBSERVATION and therefore could not be discharged to SNF Continue when necessary pain management Continue with knee immobilizer She will follow up with ORTHO in 1 week  2. Diet controlled diabetes:She may continue ADA diet at home 3. History of CVA with left hand contracture: Continue aspirin and statin  4. Hypothyroidism: Continue Synthroid  5. Depression: Continue Celexa  6. Mild acute kidney injury: Resolved with IVF  DISCHARGE CONDITIONS AND DIET:  Stable Diabetic diet  CONSULTS OBTAINED:  Treatment Team:  Dereck Leep, MD  DRUG ALLERGIES:   Allergies  Allergen Reactions  . Benadryl [Diphenhydramine Hcl] Other (See Comments)    Pt states that it makes her legs jump.   . Codeine Nausea And Vomiting  . Oxycodone Nausea And Vomiting and Other (See Comments)    "Makes me real irritable and I feel terrible."  . Penicillins  Other (See Comments)    Pt states that it does not work for her.   Has patient had a PCN reaction causing immediate rash, facial/tongue/throat swelling, SOB or lightheadedness with hypotension: No Has patient had a PCN reaction causing severe rash involving mucus membranes or skin necrosis: No Has patient had a PCN reaction that required hospitalization: No Has patient had a PCN reaction occurring within the last 10 years: No If all of the above answers are "NO", then may proceed with Cephalosporin use.     DISCHARGE MEDICATIONS:   Current Discharge Medication List    START taking these medications   Details  HYDROcodone-acetaminophen (NORCO/VICODIN) 5-325 MG tablet Take 1 tablet every 6 (six) hours as needed by mouth for moderate pain. Qty: 30 tablet, Refills: 0    senna-docusate (SENOKOT-S) 8.6-50 MG tablet Take 1 tablet at bedtime as needed by mouth for mild constipation.      CONTINUE these medications which have NOT CHANGED   Details  acetaminophen (TYLENOL) 500 MG tablet Take 500 mg every 8 (eight) hours as needed by mouth for headache.    amLODipine (NORVASC) 5 MG tablet Take 5 mg by mouth every morning.     aspirin 325 MG tablet Take 1 tablet (325 mg total) by mouth daily.    atorvastatin (LIPITOR) 40 MG tablet Take 1 tablet (40 mg total) by mouth daily. Qty: 30 tablet, Refills: 11    citalopram (CELEXA) 20 MG tablet Take 20 mg by mouth daily.    levETIRAcetam (KEPPRA) 500 MG tablet Take 1 tablet (500  mg total) by mouth 2 (two) times daily. Qty: 60 tablet, Refills: 2    levothyroxine (SYNTHROID, LEVOTHROID) 75 MCG tablet Take 75 mcg by mouth daily before breakfast.          Today   CHIEF COMPLAINT:  Still with some leg pain She knows she cannot go to SNF she cannot afford it   VITAL SIGNS:  Blood pressure (!) 112/51, pulse 85, temperature 98.8 F (37.1 C), temperature source Oral, resp. rate 20, height 5\' 5"  (1.651 m), weight 68 kg (150 lb), SpO2 94  %.   REVIEW OF SYSTEMS:  Review of Systems  Constitutional: Negative.  Negative for chills, fever and malaise/fatigue.  HENT: Negative.  Negative for ear discharge, ear pain, hearing loss, nosebleeds and sore throat.   Eyes: Negative.  Negative for blurred vision and pain.  Respiratory: Negative.  Negative for cough, hemoptysis, shortness of breath and wheezing.   Cardiovascular: Negative.  Negative for chest pain, palpitations and leg swelling.  Gastrointestinal: Negative.  Negative for abdominal pain, blood in stool, diarrhea, nausea and vomiting.  Genitourinary: Negative.  Negative for dysuria.  Musculoskeletal: Positive for joint pain. Negative for back pain.  Skin: Negative.   Neurological: Negative for dizziness, tremors, speech change, focal weakness, seizures and headaches.  Endo/Heme/Allergies: Negative.  Does not bruise/bleed easily.  Psychiatric/Behavioral: Negative.  Negative for depression, hallucinations and suicidal ideas.     PHYSICAL EXAMINATION:   GENERAL:  83 y.o.-year-old patient lying in the bed with no acute distress.  NECK:  Supple, no jugular venous distention. No thyroid enlargement, no tenderness.  LUNGS: Normal breath sounds bilaterally, no wheezing, rales,rhonchi  No use of accessory muscles of respiration.  CARDIOVASCULAR: S1, S2 normal. No murmurs, rubs, or gallops.  ABDOMEN: Soft, non-tender, non-distended. Bowel sounds present. No organomegaly or mass.  EXTREMITIES: No pedal edema, cyanosis, or clubbing. She has immobilser placed on leg PSYCHIATRIC: The patient is alert and oriented x 3.  SKIN: No obvious rash, lesion, or ulcer.   DATA REVIEW:   CBC Recent Labs  Lab 08/14/17 1639  WBC 8.6  HGB 11.7*  HCT 32.6*  PLT 223    Chemistries  Recent Labs  Lab 08/16/17 0348  NA 136  K 3.8  CL 108  CO2 23  GLUCOSE 113*  BUN 36*  CREATININE 1.11*  CALCIUM 8.6*    Cardiac Enzymes No results for input(s): TROPONINI in the last 168  hours.  Microbiology Results  @[email or web address] @  RADIOLOGY:  Ct Head Wo Contrast  Result Date: 08/14/2017 CLINICAL DATA:  [place] to the floor after acute onset of left leg weakness. Head struck table. EXAM: CT HEAD WITHOUT CONTRAST CT CERVICAL SPINE WITHOUT CONTRAST TECHNIQUE: Multidetector CT imaging of the head and cervical spine was performed following the standard protocol without intravenous contrast. Multiplanar CT image reconstructions of the cervical spine were also generated. COMPARISON:  CT and MRI 06/29/2017 FINDINGS: CT HEAD FINDINGS Brain: No acute finding. Generalized brain atrophy. Chronic small-vessel ischemic changes of the hemispheric white matter. Old infarction in the right basal ganglia region. No sign of acute infarction, mass lesion, hemorrhage, hydrocephalus or extra-axial collection. Vascular: No abnormal vascular finding. Skull: No skull fracture. Sinuses/Orbits: Clear/normal Other: None CT CERVICAL SPINE FINDINGS Alignment: Exaggerated cervical lordosis. 2 mm degenerative anterolisthesis C4-5. 2 mm degenerative anterolisthesis C6-7 and C7-T1. Skull base and vertebrae: No fracture. Soft tissues and spinal canal: No significant soft tissue finding. Disc levels: Degenerative spondylosis throughout the cervical spine with disc space narrowing most pronounced at  C2-3, C3-4 and C5-6. Facet osteoarthritis throughout the cervical region. No evidence of severe bony canal stenosis. Mild to moderate bony foraminal narrowing throughout the cervical region. Upper chest: Benign appearing scarring. Other: None IMPRESSION: Head CT: No acute or traumatic finding. Old white matter ischemic changes an old right basal ganglia region infarction. Cervical spine CT: No acute or traumatic finding. Chronic degenerative changes throughout the cervical region as outlined above. Electronically Signed   By: Nelson Chimes M.D.   On: 08/14/2017 14:28   Ct Cervical Spine Wo Contrast  Result Date:  08/14/2017 CLINICAL DATA:  [place] to the floor after acute onset of left leg weakness. Head struck table. EXAM: CT HEAD WITHOUT CONTRAST CT CERVICAL SPINE WITHOUT CONTRAST TECHNIQUE: Multidetector CT imaging of the head and cervical spine was performed following the standard protocol without intravenous contrast. Multiplanar CT image reconstructions of the cervical spine were also generated. COMPARISON:  CT and MRI 06/29/2017 FINDINGS: CT HEAD FINDINGS Brain: No acute finding. Generalized brain atrophy. Chronic small-vessel ischemic changes of the hemispheric white matter. Old infarction in the right basal ganglia region. No sign of acute infarction, mass lesion, hemorrhage, hydrocephalus or extra-axial collection. Vascular: No abnormal vascular finding. Skull: No skull fracture. Sinuses/Orbits: Clear/normal Other: None CT CERVICAL SPINE FINDINGS Alignment: Exaggerated cervical lordosis. 2 mm degenerative anterolisthesis C4-5. 2 mm degenerative anterolisthesis C6-7 and C7-T1. Skull base and vertebrae: No fracture. Soft tissues and spinal canal: No significant soft tissue finding. Disc levels: Degenerative spondylosis throughout the cervical spine with disc space narrowing most pronounced at C2-3, C3-4 and C5-6. Facet osteoarthritis throughout the cervical region. No evidence of severe bony canal stenosis. Mild to moderate bony foraminal narrowing throughout the cervical region. Upper chest: Benign appearing scarring. Other: None IMPRESSION: Head CT: No acute or traumatic finding. Old white matter ischemic changes an old right basal ganglia region infarction. Cervical spine CT: No acute or traumatic finding. Chronic degenerative changes throughout the cervical region as outlined above. Electronically Signed   By: Nelson Chimes M.D.   On: 08/14/2017 14:28   Ct Knee Left Wo Contrast  Result Date: 08/14/2017 CLINICAL DATA:  Possible medial tibial plateau fracture. EXAM: CT OF THE LEFT KNEE WITHOUT CONTRAST TECHNIQUE:  Multidetector CT imaging of the left knee was performed according to the standard protocol. Multiplanar CT image reconstructions were also generated. COMPARISON:  Left knee x-rays from same day. FINDINGS: Bones/Joint/Cartilage There is a minimally depressed fracture of the medial tibial plateau. There is a moderate lipohemarthrosis. Minimal tricompartmental degenerative changes.  Diffuse osteopenia. Ligaments Suboptimally assessed by CT. Muscles and Tendons No focal abnormality.  The extensor mechanism is intact. Soft tissues Unremarkable. IMPRESSION: 1. Minimally depressed fracture of the medial tibial plateau. 2. Moderate lipohemarthrosis. Electronically Signed   By: Titus Dubin M.D.   On: 08/14/2017 15:36   Dg Chest Port 1 View  Result Date: 08/14/2017 CLINICAL DATA:  83 year old female with cough. EXAM: PORTABLE CHEST 1 VIEW COMPARISON:  Chest radiograph dated 06/23/2016 FINDINGS: There is emphysematous changes of the lungs. No focal consolidation, pleural effusion, or pneumothorax. The cardiac silhouette is within normal limits. There is atherosclerotic calcification of the aortic arch. No acute osseous pathology. Old healed left posterior rib fractures noted. IMPRESSION: 1. No acute cardiopulmonary process. 2. Emphysema. Electronically Signed   By: Anner Crete M.D.   On: 08/14/2017 20:20   Dg Knee Complete 4 Views Left  Result Date: 08/14/2017 CLINICAL DATA:  Patient fell when arising from a chair when the left leg gave  out. Patient ports left knee and hip pain. EXAM: LEFT KNEE - COMPLETE 4+ VIEW COMPARISON:  None in PACs FINDINGS: The bones are subjectively osteopenic. There is subtle irregularity of the contour of the cortex of the proximal tibia at the level of the medial tibial plateau. This may reflect a non depressed or minimally depressed tibial plateau fracture. The lateral tibial plateau appears intact as do the femoral condyles. There is no patellar fracture. The proximal fibula is  intact. There is mild narrowing of the medial and lateral joint spaces. IMPRESSION: Possible non depressed or minimally depressed fracture of the medial tibial plateau. Further evaluation with CT scanning is recommended. Electronically Signed   By: David  Martinique M.D.   On: 08/14/2017 14:38   Dg Hip Unilat W Or Wo Pelvis 2-3 Views Left  Result Date: 08/14/2017 CLINICAL DATA:  Hip pain EXAM: DG HIP (WITH OR WITHOUT PELVIS) 2-3V LEFT COMPARISON:  05/21/2016 FINDINGS: Osteopenia. Surgical rod in the right femur. Pubic symphysis is intact. There is no fracture or dislocation. There are mild degenerative changes. IMPRESSION: No acute osseous abnormality.  Osteopenia. Electronically Signed   By: Donavan Foil M.D.   On: 08/14/2017 14:37      Current Discharge Medication List    START taking these medications   Details  HYDROcodone-acetaminophen (NORCO/VICODIN) 5-325 MG tablet Take 1 tablet every 6 (six) hours as needed by mouth for moderate pain. Qty: 30 tablet, Refills: 0    senna-docusate (SENOKOT-S) 8.6-50 MG tablet Take 1 tablet at bedtime as needed by mouth for mild constipation.      CONTINUE these medications which have NOT CHANGED   Details  acetaminophen (TYLENOL) 500 MG tablet Take 500 mg every 8 (eight) hours as needed by mouth for headache.    amLODipine (NORVASC) 5 MG tablet Take 5 mg by mouth every morning.     aspirin 325 MG tablet Take 1 tablet (325 mg total) by mouth daily.    atorvastatin (LIPITOR) 40 MG tablet Take 1 tablet (40 mg total) by mouth daily. Qty: 30 tablet, Refills: 11    citalopram (CELEXA) 20 MG tablet Take 20 mg by mouth daily.    levETIRAcetam (KEPPRA) 500 MG tablet Take 1 tablet (500 mg total) by mouth 2 (two) times daily. Qty: 60 tablet, Refills: 2    levothyroxine (SYNTHROID, LEVOTHROID) 75 MCG tablet Take 75 mcg by mouth daily before breakfast.            Management plans discussed with the patient and she is in agreement. Stable for  discharge home with [facility]  Patient should follow up with ortho  CODE STATUS:     Code Status Orders  (From admission, onward)        Start     Ordered   08/14/17 1821  Full code  Continuous     08/14/17 1820    Code Status History    Date Active Date Inactive Code Status Order ID Comments User Context   06/21/2017 12:38 06/22/2017 18:25 Full Code [id number]  Nicholes Mango, MD Inpatient   06/23/2016 12:53 06/24/2016 07:04 DNR [id number]  Bettey Costa, MD Inpatient   06/23/2016 12:11 06/23/2016 12:53 Full Code [id number]  Bettey Costa, MD Inpatient   06/23/2016 11:18 06/23/2016 12:11 DNR [id number]  Bettey Costa, MD ED      TOTAL TIME TAKING CARE OF THIS PATIENT: 38 minutes.    Note: This dictation was prepared with Dragon dictation along with smaller phrase technology. Any transcriptional errors that result from  this process are unintentional.  Izzabelle Bouley M.D on 08/16/2017 at 9:37 AM  Between 7am to 6pm - Pager - [phone] After 6pm go to [email or web address] - password [email or web address] [facility]  Office  [phone]  CC: Primary care physician; Herminio Commons, MD

## 2017-08-16 NOTE — Consult Note (Signed)
ORTHOPAEDIC CONSULTATION  PATIENT NAME: Vanessa [name] DOB: 1931-01-31  MRN: [id number]  REQUESTING PHYSICIAN: Bettey Costa, MD  Chief Complaint: Left knee pain  HPI: Vanessa [name] is a 83 y.o. female who complains of left knee pain.  The patient was at home and sustained a mechanical fall, landing on her left side and knee.  She had the immediate onset of left knee pain and had difficulty with standing or bearing weight due to the knee pain.  Consciousness.  She denied any other injuries.  Past Medical History:  Diagnosis Date  . Colon cancer ([place])   . Diabetes mellitus without complication ([place])   . Stroke ([place])   . Thyroid disorder    Past Surgical History:  Procedure Laterality Date  . BACK SURGERY    . BREAST SURGERY    . COLON SURGERY    . TONSILLECTOMY     Social History   Socioeconomic History  . Marital status: Married    Spouse name: None  . Number of children: None  . Years of education: None  . Highest education level: None  Social Needs  . Financial resource strain: None  . Food insecurity - worry: None  . Food insecurity - inability: None  . Transportation needs - medical: None  . Transportation needs - non-medical: None  Occupational History  . None  Tobacco Use  . Smoking status: Never Smoker  . Smokeless tobacco: Never Used  Substance and Sexual Activity  . Alcohol use: No  . Drug use: No  . Sexual activity: None  Other Topics Concern  . None  Social History Narrative  . None   Family History  Problem Relation Age of Onset  . Breast cancer Mother   . Heart attack Father   . Diabetes Brother    Allergies  Allergen Reactions  . Benadryl [Diphenhydramine Hcl] Other (See Comments)    Pt states that it makes her legs jump.   . Codeine Nausea And Vomiting  . Oxycodone Nausea And Vomiting and Other (See Comments)    "Makes me real irritable and I feel terrible."  . Penicillins Other (See Comments)    Pt states that it does not work for her.    Has patient had a PCN reaction causing immediate rash, facial/tongue/throat swelling, SOB or lightheadedness with hypotension: No Has patient had a PCN reaction causing severe rash involving mucus membranes or skin necrosis: No Has patient had a PCN reaction that required hospitalization: No Has patient had a PCN reaction occurring within the last 10 years: No If all of the above answers are "NO", then may proceed with Cephalosporin use.    Prior to Admission medications   Medication Sig Start Date End Date Taking? Authorizing Provider  acetaminophen (TYLENOL) 500 MG tablet Take 500 mg every 8 (eight) hours as needed by mouth for headache.   Yes [provider]  amLODipine (NORVASC) 5 MG tablet Take 5 mg by mouth every morning.    Yes [provider]  aspirin 325 MG tablet Take 1 tablet (325 mg total) by mouth daily. 06/25/16  Yes Dustin Flock, MD  atorvastatin (LIPITOR) 40 MG tablet Take 1 tablet (40 mg total) by mouth daily. 06/22/17 06/22/18 Yes Dustin Flock, MD  citalopram (CELEXA) 20 MG tablet Take 20 mg by mouth daily.   Yes [provider]  levETIRAcetam (KEPPRA) 500 MG tablet Take 1 tablet (500 mg total) by mouth 2 (two) times daily. 06/24/16  Yes Dustin Flock, MD  levothyroxine (SYNTHROID, LEVOTHROID) 75 MCG tablet Take 75 mcg by mouth daily before breakfast.   Yes [provider]   Ct Head Wo Contrast  Result Date: 08/14/2017 CLINICAL DATA:  [place] to the floor after acute onset of left leg weakness. Head struck table. EXAM: CT HEAD WITHOUT CONTRAST CT CERVICAL SPINE WITHOUT CONTRAST TECHNIQUE: Multidetector CT imaging of the head and cervical spine was performed following the standard protocol without intravenous contrast. Multiplanar CT image reconstructions of the cervical spine were also generated. COMPARISON:  CT and MRI 06/29/2017 FINDINGS: CT HEAD FINDINGS Brain: No acute finding. Generalized brain atrophy. Chronic small-vessel ischemic  changes of the hemispheric white matter. Old infarction in the right basal ganglia region. No sign of acute infarction, mass lesion, hemorrhage, hydrocephalus or extra-axial collection. Vascular: No abnormal vascular finding. Skull: No skull fracture. Sinuses/Orbits: Clear/normal Other: None CT CERVICAL SPINE FINDINGS Alignment: Exaggerated cervical lordosis. 2 mm degenerative anterolisthesis C4-5. 2 mm degenerative anterolisthesis C6-7 and C7-T1. Skull base and vertebrae: No fracture. Soft tissues and spinal canal: No significant soft tissue finding. Disc levels: Degenerative spondylosis throughout the cervical spine with disc space narrowing most pronounced at C2-3, C3-4 and C5-6. Facet osteoarthritis throughout the cervical region. No evidence of severe bony canal stenosis. Mild to moderate bony foraminal narrowing throughout the cervical region. Upper chest: Benign appearing scarring. Other: None IMPRESSION: Head CT: No acute or traumatic finding. Old white matter ischemic changes an old right basal ganglia region infarction. Cervical spine CT: No acute or traumatic finding. Chronic degenerative changes throughout the cervical region as outlined above. Electronically Signed   By: Nelson Chimes M.D.   On: 08/14/2017 14:28   Ct Cervical Spine Wo Contrast  Result Date: 08/14/2017 CLINICAL DATA:  [place] to the floor after acute onset of left leg weakness. Head struck table. EXAM: CT HEAD WITHOUT CONTRAST CT CERVICAL SPINE WITHOUT CONTRAST TECHNIQUE: Multidetector CT imaging of the head and cervical spine was performed following the standard protocol without intravenous contrast. Multiplanar CT image reconstructions of the cervical spine were also generated. COMPARISON:  CT and MRI 06/29/2017 FINDINGS: CT HEAD FINDINGS Brain: No acute finding. Generalized brain atrophy. Chronic small-vessel ischemic changes of the hemispheric white matter. Old infarction in the right basal ganglia region. No sign of acute  infarction, mass lesion, hemorrhage, hydrocephalus or extra-axial collection. Vascular: No abnormal vascular finding. Skull: No skull fracture. Sinuses/Orbits: Clear/normal Other: None CT CERVICAL SPINE FINDINGS Alignment: Exaggerated cervical lordosis. 2 mm degenerative anterolisthesis C4-5. 2 mm degenerative anterolisthesis C6-7 and C7-T1. Skull base and vertebrae: No fracture. Soft tissues and spinal canal: No significant soft tissue finding. Disc levels: Degenerative spondylosis throughout the cervical spine with disc space narrowing most pronounced at C2-3, C3-4 and C5-6. Facet osteoarthritis throughout the cervical region. No evidence of severe bony canal stenosis. Mild to moderate bony foraminal narrowing throughout the cervical region. Upper chest: Benign appearing scarring. Other: None IMPRESSION: Head CT: No acute or traumatic finding. Old white matter ischemic changes an old right basal ganglia region infarction. Cervical spine CT: No acute or traumatic finding. Chronic degenerative changes throughout the cervical region as outlined above. Electronically Signed   By: Nelson Chimes M.D.   On: 08/14/2017 14:28   Ct Knee Left Wo Contrast  Result Date: 08/14/2017 CLINICAL DATA:  Possible medial tibial plateau fracture. EXAM: CT OF THE LEFT KNEE WITHOUT CONTRAST TECHNIQUE: Multidetector CT imaging of the left knee was performed according to the standard protocol. Multiplanar CT image reconstructions were also generated. COMPARISON:  Left knee x-rays from same day. FINDINGS: Bones/Joint/Cartilage There is a minimally depressed fracture of the medial tibial plateau. There is a moderate lipohemarthrosis. Minimal tricompartmental degenerative changes.  Diffuse osteopenia. Ligaments Suboptimally assessed by CT. Muscles and Tendons No focal abnormality.  The extensor mechanism is intact. Soft tissues Unremarkable. IMPRESSION: 1. Minimally depressed fracture of the medial tibial plateau. 2. Moderate  lipohemarthrosis. Electronically Signed   By: Titus Dubin M.D.   On: 08/14/2017 15:36   Dg Chest Port 1 View  Result Date: 08/14/2017 CLINICAL DATA:  83 year old female with cough. EXAM: PORTABLE CHEST 1 VIEW COMPARISON:  Chest radiograph dated 06/23/2016 FINDINGS: There is emphysematous changes of the lungs. No focal consolidation, pleural effusion, or pneumothorax. The cardiac silhouette is within normal limits. There is atherosclerotic calcification of the aortic arch. No acute osseous pathology. Old healed left posterior rib fractures noted. IMPRESSION: 1. No acute cardiopulmonary process. 2. Emphysema. Electronically Signed   By: Anner Crete M.D.   On: 08/14/2017 20:20   Dg Knee Complete 4 Views Left  Result Date: 08/14/2017 CLINICAL DATA:  Patient fell when arising from a chair when the left leg gave out. Patient ports left knee and hip pain. EXAM: LEFT KNEE - COMPLETE 4+ VIEW COMPARISON:  None in PACs FINDINGS: The bones are subjectively osteopenic. There is subtle irregularity of the contour of the cortex of the proximal tibia at the level of the medial tibial plateau. This may reflect a non depressed or minimally depressed tibial plateau fracture. The lateral tibial plateau appears intact as do the femoral condyles. There is no patellar fracture. The proximal fibula is intact. There is mild narrowing of the medial and lateral joint spaces. IMPRESSION: Possible non depressed or minimally depressed fracture of the medial tibial plateau. Further evaluation with CT scanning is recommended. Electronically Signed   By: David  Martinique M.D.   On: 08/14/2017 14:38   Dg Hip Unilat W Or Wo Pelvis 2-3 Views Left  Result Date: 08/14/2017 CLINICAL DATA:  Hip pain EXAM: DG HIP (WITH OR WITHOUT PELVIS) 2-3V LEFT COMPARISON:  05/21/2016 FINDINGS: Osteopenia. Surgical rod in the right femur. Pubic symphysis is intact. There is no fracture or dislocation. There are mild degenerative changes. IMPRESSION:  No acute osseous abnormality.  Osteopenia. Electronically Signed   By: Donavan Foil M.D.   On: 08/14/2017 14:37    Positive ROS: All other systems have been reviewed and were otherwise negative with the exception of those mentioned in the HPI and as above.  Physical Exam: General: Well developed, well nourished female seen in no acute distress. HEENT: Atraumatic and normocephalic. Sclera are clear. Extraocular motion is intact. Oropharynx is clear with moist mucosa. Neck: Supple, nontender, good range of motion.  Lungs: Clear to auscultation bilaterally. Cardiovascular: Regular rate and rhythm with normal S1 and S2. No murmurs. No gallops or rubs. Pedal pulses are palpable bilaterally. Homans test is negative bilaterally. No significant pretibial or ankle edema. Abdomen: Soft, nontender, and nondistended. Bowel sounds are present. Skin: No lesions in the area of chief complaint Neurologic: Awake, alert, and oriented. Sensory function is grossly intact. Motor strength is felt to be 5 over 5 bilaterally. No clonus or tremor. Good motor coordination. Lymphatic: No axillary or cervical lymphadenopathy  MUSCULOSKELETAL: Examination of the left knee shows no gross effusion.  Mild soft tissue swelling is appreciated.Ecchymosis or erythema.  There is tenderness to palpation along the medial aspect of the knee.  Gentle range of motion is tolerated but somewhat painful.  The knee is grossly stable to varus valgus stress.  Fair quadriceps tone.  Assessment: Left medial tibial plateau fracture  Plan: The findings were discussed in detail with the patient.  The knee immobilizer was adjusted.  I had previously spoken to physical therapy.  I recommended nonweightbearing to the left lower extremity.  At the present time there is minimal displacement and I would recommend nonsurgical treatment.  James P. Holley Bouche M.D.

## 2017-08-16 NOTE — Progress Notes (Signed)
Discharge summary reviewed with and patient and daughter with verbal understanding. 1 narcotic Rx given upon discharge. EMS called for transport.

## 2017-08-16 NOTE — Care Management (Signed)
Another message left for patient's daughter [name]. [name] left message for [facility] late yesterday after requesting to discuss discharge planning.
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
## 2017-08-16 NOTE — Care Management (Addendum)
Per Dr. Benjie Karvonen patient is ready for discharge to home today. RNCM received callback from patient's daughter [name] that is the sole provider to both her father (going to [facility] today from this hospital), her husband that has lung cancer, and now her mother that has Left medial tibial plateau fracture and non-weight bearing.  She would like to talk with Dr. Benjie Karvonen first.  She also has to go to [facility] to get her father admitted for rehab.  Patient has bedside commode, wheelchair, and walker. Per [name] she will not have room for a hospital bed. She will need EMS to home.  [facility]  Patient has private duty/private pay assistance M, W, F for 5 hours. EMS packet completed. RN updated that daughter will let me know when she is ready to receive patient by EMS. Address: [address] [place] [address].

## 2017-08-16 NOTE — Care Management (Signed)
Daughter [name] called this RNCM back requesting change to home health agency to [facility].  I have notified Octavia Bruckner and Sonia Side with [facility] of this need and that patient's is discharging today. No other RNCM needs.Marland Kitchen
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
## 2017-08-16 NOTE — Care Management (Signed)
Daughter [name] text this RNCM stating that she is ready for EMS to be called.  AD RN notifed and she will tell nurse. No other RNCM needs. I have notified Tim with [facility] of patient discharge.
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
## 2017-08-17 NOTE — Care Management (Signed)
Late note entry 08/17/17: RNCM received notification from PT that patient expressed that she was suicidal and that home social worker should be added to order.  I have made this request and included [facility].  Per psych note patient was not suicidal on 08/16/17. I spoke to daughter [name] and she said "she says that all the time but she's a [place] and believes she will go to hell if she does that so she won't do it".

## 2017-08-21 ENCOUNTER — Emergency Department: Payer: Medicare Other

## 2017-08-21 ENCOUNTER — Emergency Department
Admission: EM | Admit: 2017-08-21 | Discharge: 2017-08-21 | Disposition: A | Discharge status: Home / Self Care | Payer: Medicare Other | Attending: Emergency Medicine | Admitting: Emergency Medicine

## 2017-08-21 ENCOUNTER — Other Ambulatory Visit: Payer: Self-pay

## 2017-08-21 DIAGNOSIS — E785 Hyperlipidemia, unspecified: Secondary | ICD-10-CM | POA: Insufficient documentation

## 2017-08-21 DIAGNOSIS — Z7982 Long term (current) use of aspirin: Secondary | ICD-10-CM | POA: Diagnosis not present

## 2017-08-21 DIAGNOSIS — Z8673 Personal history of transient ischemic attack (TIA), and cerebral infarction without residual deficits: Secondary | ICD-10-CM | POA: Diagnosis not present

## 2017-08-21 DIAGNOSIS — R200 Anesthesia of skin: Secondary | ICD-10-CM | POA: Diagnosis not present

## 2017-08-21 DIAGNOSIS — J441 Chronic obstructive pulmonary disease with (acute) exacerbation: Secondary | ICD-10-CM | POA: Insufficient documentation

## 2017-08-21 DIAGNOSIS — R079 Chest pain, unspecified: Secondary | ICD-10-CM | POA: Insufficient documentation

## 2017-08-21 DIAGNOSIS — E119 Type 2 diabetes mellitus without complications: Secondary | ICD-10-CM | POA: Diagnosis not present

## 2017-08-21 DIAGNOSIS — Z85038 Personal history of other malignant neoplasm of large intestine: Secondary | ICD-10-CM | POA: Insufficient documentation

## 2017-08-21 DIAGNOSIS — Z79899 Other long term (current) drug therapy: Secondary | ICD-10-CM | POA: Insufficient documentation

## 2017-08-21 LAB — CBC WITH DIFFERENTIAL/PLATELET
BASOS ABS: 0 10*3/uL (ref 0–0.1)
Basophils Relative: 0 %
EOS ABS: 0 10*3/uL (ref 0–0.7)
EOS PCT: 0 %
HCT: 30.8 % — ABNORMAL LOW (ref 35.0–47.0)
Hemoglobin: 11 g/dL — ABNORMAL LOW (ref 12.0–16.0)
Lymphocytes Relative: 21 %
Lymphs Abs: 1.5 10*3/uL (ref 1.0–3.6)
MCH: 36.1 pg — ABNORMAL HIGH (ref 26.0–34.0)
MCHC: 35.8 g/dL (ref 32.0–36.0)
MCV: 101 fL — ABNORMAL HIGH (ref 80.0–100.0)
Monocytes Absolute: 0.6 10*3/uL (ref 0.2–0.9)
Monocytes Relative: 8 %
Neutro Abs: 5.2 10*3/uL (ref 1.4–6.5)
Neutrophils Relative %: 71 %
PLATELETS: 219 10*3/uL (ref 150–440)
RBC: 3.05 MIL/uL — AB (ref 3.80–5.20)
RDW: 14.9 % — ABNORMAL HIGH (ref 11.5–14.5)
WBC: 7.4 10*3/uL (ref 3.6–11.0)

## 2017-08-21 LAB — COMPREHENSIVE METABOLIC PANEL
ALT: 32 U/L (ref 14–54)
AST: 77 U/L — AB (ref 15–41)
Albumin: 4 g/dL (ref 3.5–5.0)
Alkaline Phosphatase: 100 U/L (ref 38–126)
Anion gap: 9 (ref 5–15)
BILIRUBIN TOTAL: 3.5 mg/dL — AB (ref 0.3–1.2)
BUN: 19 mg/dL (ref 6–20)
CO2: 22 mmol/L (ref 22–32)
CREATININE: 1.02 mg/dL — AB (ref 0.44–1.00)
Calcium: 9.2 mg/dL (ref 8.9–10.3)
Chloride: 103 mmol/L (ref 101–111)
GFR, EST AFRICAN AMERICAN: 57 mL/min — AB (ref >60)
GFR, EST NON AFRICAN AMERICAN: 49 mL/min — AB (ref >60)
Glucose, Bld: 122 mg/dL — ABNORMAL HIGH (ref 65–99)
POTASSIUM: 4.4 mmol/L (ref 3.5–5.1)
SODIUM: 134 mmol/L — AB (ref 135–145)
TOTAL PROTEIN: 6.8 g/dL (ref 6.5–8.1)

## 2017-08-21 LAB — TROPONIN I: Troponin I: <0.03 ng/mL (ref <0.03)

## 2017-08-21 MED ORDER — PREDNISONE 10 MG PO TABS: ORAL_TABLET | ORAL | 0 refills | Status: DC

## 2017-08-21 MED ORDER — IPRATROPIUM-ALBUTEROL 0.5-2.5 (3) MG/3ML IN SOLN
3.0000 mL | Freq: Once | RESPIRATORY_TRACT | Status: AC
  Administered 2017-08-21: 3 mL via RESPIRATORY_TRACT
  Filled 2017-08-21: qty 3

## 2017-08-21 MED ORDER — IPRATROPIUM-ALBUTEROL 0.5-2.5 (3) MG/3ML IN SOLN
3.0000 mL | Freq: Once | RESPIRATORY_TRACT | Status: DC
  Filled 2017-08-21: qty 3

## 2017-08-21 MED ORDER — ASPIRIN 81 MG PO CHEW
324.0000 mg | CHEWABLE_TABLET | Freq: Once | ORAL | Status: AC
  Administered 2017-08-21: 324 mg via ORAL
  Filled 2017-08-21: qty 4

## 2017-08-21 MED ORDER — METHYLPREDNISOLONE SODIUM SUCC 125 MG IJ SOLR
125.0000 mg | Freq: Once | INTRAMUSCULAR | Status: AC
  Administered 2017-08-21: 125 mg via INTRAVENOUS
  Filled 2017-08-21: qty 2

## 2017-08-21 MED ORDER — ACETAMINOPHEN 325 MG PO TABS
650.0000 mg | ORAL_TABLET | Freq: Once | ORAL | Status: AC
  Administered 2017-08-21: 650 mg via ORAL
  Filled 2017-08-21: qty 2

## 2017-08-21 NOTE — Care Management Note (Signed)
Case Management Note  Patient Details  Name: Vanessa [name] MRN: [id number] Date of Birth: 01-08-1931  Subjective/Objective:        Verified services are provided with [facility] [employer]. The address for d/c is the daughter [name] [address]. The patient and daughter [name] have been made aware as well as the MD and RN for the patient.            Action/Plan:   Expected Discharge Date:                  Expected Discharge Plan:     In-House Referral:     Discharge planning Services     Post Acute Care Choice:    Choice offered to:     DME Arranged:    DME Agency:     HH Arranged:    HH Agency:     Status of Service:     If discussed at [employer] of Stay Meetings, dates discussed:    Additional Comments:  Beau Fanny, RN 08/21/2017, 1:15 PM

## 2017-08-21 NOTE — Clinical Social Work Note (Signed)
CSW consulted for "needs nh placement." CSW and [facility] Vanessa [name] met with pt and dtrButch [name] at bedside. Pt came intoday via EMS with C/O chest pain. Pt was admitted to [facility] under OBS status on 08/14/17 and discharged to Ms. Vanessa [name]'s home on 08/16/17. Pt and dtr-Vanessa [name] verified pt is staying with other dtr- Vanessa [name] in her home. CSW verified pt unable to pay privately at this time and Medicare will not pay without 3 night qualifying stay. RNCM to verify [place] services are in place and pt will discharge back to dtr-Vanessa [name]'s home, when medically ready. MD updated. RNCM following for discharge needs. CSW signing off as no further Social Work needs identified.   Vanessa [name], Vanessa [name], [place] Social Worker-ED [phone]

## 2017-08-21 NOTE — ED Notes (Signed)
Patient transported to MR. 

## 2017-08-21 NOTE — ED Notes (Signed)
Pt currently waiting for [facility] for a ride to her daughter [name]'s residence, where pt lives currently. Pt and daughter [name], who signed for discharge for pt, verbalized understanding of discharge instructions, prescription and follow-up care. Pt is non-ambulatory d/t fractured left tibial plateau dx here this past week. VSS. A&O x4. Skin warm and dry.

## 2017-08-21 NOTE — ED Provider Notes (Signed)
[facility] Emergency Department Provider Note ____________________________________________   I have reviewed the triage vital signs and the triage nursing note.  HISTORY  Chief Complaint Chest Pain   Historian Patient  HPI Vanessa [name] is a 83 y.o. female arrived by EMS, patient states that this morning her daughter was changing her diaper wheezing and dyspnea and developed central chest pain that feels like a boot kicked her in her chest.  EMS reports any chest pain was reproducible when they got there and patient received DuoNeb treatment with some reported improvement in her discomfort.  Patient states that the chest discomfort is nearly gone but she still feeling dyspneic and wheezy.  History of MI about a decade ago, patient states this does not necessarily feel any similar.  She has a recent left leg fracture as a result of a fall.   Past Medical History:  Diagnosis Date  . Colon cancer ([place])   . Diabetes mellitus without complication ([place])   . Stroke ([place])   . Thyroid disorder     Patient Active Problem List   Diagnosis Date Noted  . 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 Closed tibia fracture 08/14/2017  . TIA (transient ischemic attack) 06/23/2016  . Absolute anemia 05/30/2015  . B12 deficiency 05/30/2015  . Cerebral vascular accident ([place]) 05/30/2015  . Colon cancer ([place]) 05/30/2015  . Diabetes mellitus, type 2 ([place]) 05/30/2015  . HLD (hyperlipidemia) 05/30/2015  . Heart attack ([place]) 05/30/2015  . Adult hypothyroidism 05/30/2015  . Avitaminosis D 05/30/2015  . Enthesopathy of hip 01/05/2014  . Temporary cerebral vascular dysfunction 03/02/2012    Past Surgical History:  Procedure Laterality Date  . BACK SURGERY    . BREAST SURGERY    . COLON SURGERY    . TONSILLECTOMY      Prior to Admission medications   Medication Sig Start Date End Date Taking? Authorizing Provider  acetaminophen  (TYLENOL) 500 MG tablet Take 500 mg every 8 (eight) hours as needed by mouth for headache.   Yes [provider]  amLODipine (NORVASC) 5 MG tablet Take 5 mg by mouth every morning.    Yes [provider]  aspirin 325 MG tablet Take 1 tablet (325 mg total) by mouth daily. Patient taking differently: Take 81 mg by mouth daily.  06/25/16  Yes Dustin Flock, MD  atorvastatin (LIPITOR) 40 MG tablet Take 1 tablet (40 mg total) by mouth daily. 06/22/17 06/22/18 Yes Dustin Flock, MD  citalopram (CELEXA) 20 MG tablet Take 20 mg by mouth daily.   Yes [provider]  levETIRAcetam (KEPPRA) 500 MG tablet Take 1 tablet (500 mg total) by mouth 2 (two) times daily. 06/24/16  Yes Dustin Flock, MD  levothyroxine (SYNTHROID, LEVOTHROID) 75 MCG tablet Take 75 mcg by mouth daily before breakfast.   Yes [provider]  senna-docusate (SENOKOT-S) 8.6-50 MG tablet Take 1 tablet at bedtime as needed by mouth for mild constipation. 08/16/17  Yes Bettey Costa, MD  HYDROcodone-acetaminophen (NORCO/VICODIN) 5-325 MG tablet Take 1 tablet every 6 (six) hours as needed by mouth for moderate pain. Patient not taking: Reported on 08/21/2017 08/16/17   Bettey Costa, MD  predniSONE (DELTASONE) 10 MG tablet 4 tablets daily for 4 more days 08/21/17   Lisa Roca, MD    Allergies  Allergen Reactions  . Benadryl [Diphenhydramine Hcl] Other (See Comments)    Pt states that it makes her legs jump.   . Codeine Nausea And  Vomiting  . Oxycodone Nausea And Vomiting and Other (See Comments)    "Makes me real irritable and I feel terrible."  . Penicillins Other (See Comments)    Pt states that it does not work for her.   Has patient had a PCN reaction causing immediate rash, facial/tongue/throat swelling, SOB or lightheadedness with hypotension: No Has patient had a PCN reaction causing severe rash involving mucus membranes or skin necrosis: No Has patient had a PCN reaction that required  hospitalization: No Has patient had a PCN reaction occurring within the last 10 years: No If all of the above answers are "NO", then may proceed with Cephalosporin use.     Family History  Problem Relation Age of Onset  . Breast cancer Mother   . Heart attack Father   . Diabetes Brother     Social History Social History   Tobacco Use  . Smoking status: Never Smoker  . Smokeless tobacco: Never Used  Substance Use Topics  . Alcohol use: No  . Drug use: No    Review of Systems  Constitutional: Negative for fever. Eyes: Negative for visual changes. ENT: Negative for sore throat. Cardiovascular: Positive for chest pain. Respiratory: Positive for shortness of breath. Gastrointestinal: Negative for abdominal pain, vomiting and diarrhea. Genitourinary: Negative for dysuria. Musculoskeletal: Negative for back pain. Skin: Negative for rash. Neurological: Negative for headache.  ____________________________________________   PHYSICAL EXAM:  VITAL SIGNS: ED Triage Vitals  Enc Vitals Group     BP 08/21/17 0908 131/68     Pulse Rate 08/21/17 0908 100     Resp 08/21/17 0908 18     Temp --      Temp src --      SpO2 08/21/17 0906 95 %     Weight 08/21/17 0909 158 lb (71.7 kg)     Height 08/21/17 0909 5\' 5"  (1.651 m)     Head Circumference --      Peak Flow --      Pain Score 08/21/17 0906 3     Pain Loc --      Pain Edu? --      Excl. in [place]? --      Constitutional: Alert and oriented. Well appearing and in no distress, but actively wheezing HEENT   Head: Normocephalic and atraumatic.      Eyes: Conjunctivae are normal. Pupils equal and round.       Ears:         Nose: No congestion/rhinnorhea.   Mouth/Throat: Mucous membranes are moist.   Neck: No stridor. Cardiovascular/Chest: Normal rate, regular rhythm.  No murmurs, rubs, or gallops. Respiratory: No retractions, but significant wheezing in all lung fields. Gastrointestinal: Soft. No distention, no  guarding, no rebound. Nontender.    Genitourinary/rectal:Deferred Musculoskeletal: Left leg in long leg splint/knee immobilizer. Neurologic:  Normal speech and language. No gross or focal neurologic deficits are appreciated. Skin:  Skin is warm, dry and intact. No rash noted. Psychiatric: Mood and affect are normal. Speech and behavior are normal. Patient exhibits appropriate insight and judgment.   ____________________________________________  LABS (pertinent positives/negatives) I, Lisa Roca, MD the attending physician have reviewed the labs noted below.  Labs Reviewed  CBC WITH DIFFERENTIAL/PLATELET - Abnormal; Notable for the following components:      Result Value   RBC 3.05 (*)    Hemoglobin 11.0 (*)    HCT 30.8 (*)    MCV 101.0 (*)    MCH 36.1 (*)    RDW 14.9 (*)  All other components within normal limits  COMPREHENSIVE METABOLIC PANEL - Abnormal; Notable for the following components:   Sodium 134 (*)    Glucose, Bld 122 (*)    Creatinine, Ser 1.02 (*)    AST 77 (*)    Total Bilirubin 3.5 (*)    GFR calc non Af Amer 49 (*)    GFR calc Af Amer 57 (*)    All other components within normal limits  TROPONIN I  TROPONIN I    ____________________________________________    EKG I, Lisa Roca, MD, the attending physician have personally viewed and interpreted all ECGs.  98 bpm.  Normal sinus rhythm.  Narrow QRS.  Normal axis.  Nonspecific T wave.  Incomplete right bundle branch block. ____________________________________________  RADIOLOGY All Xrays were viewed by me.  Imaging interpreted by Radiologist, and I, Lisa Roca, MD the attending physician have reviewed the radiologist interpretation noted below.  Chest x-ray portable:  IMPRESSION: No acute cardiopulmonary disease.  Aortic Atherosclerosis (ICD10-I70.0).  CT head without contrast:IMPRESSION: 1. No acute finding or change from prior. 2. Remote right lateral lenticulostriate infarct. 3. Known  18 mm meningioma along the left occipital convexity.  MRI brain without contrast:  IMPRESSION: 1. No acute intracranial abnormality. 2. Stable noncontrast MRI appearance of the brain since September, including chronic right MCA lacunar infarct and left occipital lobe meningioma with mild adjacent edema. __________________________________________  PROCEDURES  Procedure(s) performed: None  Critical Care performed: None   ____________________________________________  ED COURSE / ASSESSMENT AND PLAN  Pertinent labs & imaging results that were available during my care of the patient were reviewed by me and considered in my medical decision making (see chart for details).  Patient arrived with a complaint of chest pain episode earlier, which is now essentially resolved.  She is here with COPD exacerbation.  She initiated treatment duo nebs and was breathing much better.  Nurse came to me to let me know the patient was complaining of some left leg numbness.  She does have a known fracture on that side with a knee immobilizer, initial suspicion was potential peripheral nerve impingement or location related irritation in the stretcher.  However I went to examine her and she was complaining not only of the left leg numbness which happened while the nurse was in there, approximately less than 10 minutes ago, but also reporting paresthesias to the left upper extremity and also the right face.  At this point I did go ahead and initiate code stroke.  She went to CT scan.  I discussed with the radiologist negative read.  When Dr. Doy Mince, neurology reexamined the patient, she was having some improvement of his symptoms from previous and was recommended no TPA due to minor improving symptoms.  It was recommended the patient have an MRI and if negative okay for outpatient management rather than need for inpatient.  For the chest discomfort standpoint, I am going to send a repeat troponin although  she has not had any additional or ongoing symptoms.  I am going to go ahead and give her an additional DuoNeb treatment although she is much improved from previous.  I discussed with the patient whether not she feels like her physical ability to take care of herself is reasonable at this point given the fracture and she lives alone and she states she thinks that she needs more help.  I am in agreement because she is nonweightbearing essentially by the left lower extremity and is really unable to care for  herself at home and is living alone.  Her daughter did come by today to change her diaper, however she needs more daily management than that.  I placed a consult for physical therapy and social work to hopefully find placement option.  Social work discussed options with patient and daughter -- she's been staying recently with her other daughter -they have no payment coverage for her level care right now, and are set up to have home health come into the home, patient is comfort with this plan.  MRI is reassuring.  Repeat troponin is reassuring.  Patient okay for outpatient management.  I will prescribe prednisone for CBD exacerbation.  DIFFERENTIAL DIAGNOSIS: Differential diagnosis includes, but is not limited to, ACS, aortic dissection, pulmonary embolism, cardiac tamponade, pneumothorax, pneumonia, pericarditis, myocarditis, GI-related causes including esophagitis/gastritis, and musculoskeletal chest wall pain.    CONSULTATIONS:   Dr. Doy Mince, neurology, seen the patient at bedside due to code stroke, review head CT after I discussed with radiologist read as negative, and due to minor improving symptoms, does not recommend TPA.  Dr. Tyrell Antonio recommends MRI and if negative, okay for outpatient management.  Consulted physical therapy and social work due to patient is not able to care for herself at home because she cannot bear weight on the known left lower extremity fracture and is living alone right  now, as her husband has gone to acute care rehab.   Patient / Family / Caregiver informed of clinical course, medical decision-making process, and agree with plan.  Discharge instructions:  You are evaluated for chest pain and found to have wheezing consistent with COPD exacerbation.  No certain cause was found for the chest discomfort episode, but your exam and evaluation are overall reassuring in the emergency department today.  I am recommending follow-up with your primary care doctor as well as her cardiologist.  Here in the emergency department you had an episode of left sided numbness, and neurology evaluation as well as head CT and brain MRI are reassuring for no new stroke.  Continue your aspirin.  You were evaluated by the social worker to discuss different possibilities for additional care, and plan will be to return home with your daughter and have home health care evaluate for any additional resources.  Return to emergency department immediately for any new or worsening condition including chest pain, trouble breathing, fever, confusion or altered mental status, new weakness or numbness, or any other symptoms concerning to you.   ___________________________________________   FINAL CLINICAL IMPRESSION(S) / ED DIAGNOSES   Final diagnoses:  Nonspecific chest pain  COPD with acute exacerbation ([place])  Left sided numbness      ___________________________________________  ED Discharge Orders        Ordered    predniSONE (DELTASONE) 10 MG tablet     08/21/17 1141            Note: This dictation was prepared with Dragon dictation. Any transcriptional errors that result from this process are unintentional    Lisa Roca, MD 08/21/17 1442

## 2017-08-21 NOTE — Discharge Instructions (Signed)
You are evaluated for chest pain and found to have wheezing consistent with COPD exacerbation.  No certain cause was found for the chest discomfort episode, but your exam and evaluation are overall reassuring in the emergency department today.  I am recommending follow-up with your primary care doctor as well as her cardiologist.  Here in the emergency department you had an episode of left sided numbness, and neurology evaluation as well as head CT and brain MRI are reassuring for no new stroke.  Continue your aspirin.  You were evaluated by the social worker to discuss different possibilities for additional care, and plan will be to return home with your daughter and have home health care evaluate for any additional resources.  Return to emergency department immediately for any new or worsening condition including chest pain, trouble breathing, fever, confusion or altered mental status, new weakness or numbness, or any other symptoms concerning to you.

## 2017-08-21 NOTE — ED Triage Notes (Signed)
Pt arrived via EMS from home d/t chest pain upon wakening. Pt was discharged 1 week ago d/t a broken right leg. Pt has audible wheezing and rates her current chest pain at a 3 on a 1-10 scale. Pt states she has a hx of heart attack about 10 years ago.

## 2017-08-21 NOTE — Progress Notes (Signed)
PT Cancellation Note  Patient Details Name: Vanessa [name] MRN: [id number] DOB: Jul 23, 1931   Cancelled Treatment:    Reason Eval/Treat Not Completed: Medical issues which prohibited therapy(Consult received and chart reviewed (after notification from ER about pending evaluation order).  Per chart review, patient actively undergoing code stroke work up due to acute onset of L UE/LE weakness and paresthesia; MRI pending.  Will hold evaluation at this time pending results of MRI/testing prior to initiation of exertional activity.  Will continue to follow for full diagnosis and medical work-up and will initiate PT consult as medically appropriate.)   Alixis Harmon H. Owens Shark, PT, DPT, NCS 08/21/17, 11:55 AM [phone]

## 2017-08-21 NOTE — Consult Note (Signed)
Referring Physician: Reita Cliche    Chief Complaint: Left sided numbness  HPI: Vanessa [name] is an 83 y.o. female with a history of stroke who presented the ED today with complaints of chest pain.  While in the ED also complained of left sided numbness.  Code stroke called at that time.  Initial NIHSS of 9.   Patient had similar presentation in September of this year.  Work up at that time was unremarkable.   Had a fall about a week ago and has been on bedrest since that time.    Date last known well: Date: 08/21/2017 Time last known well: Time: 10:10 tPA Given: No: Improvement in symptoms  Past Medical History:  Diagnosis Date  . Colon cancer ([place])   . Diabetes mellitus without complication ([place])   . Stroke ([place])   . Thyroid disorder     Past Surgical History:  Procedure Laterality Date  . BACK SURGERY    . BREAST SURGERY    . COLON SURGERY    . TONSILLECTOMY      Family History  Problem Relation Age of Onset  . Breast cancer Mother   . Heart attack Father   . Diabetes Brother    Social History:  reports that  has never smoked. she has never used smokeless tobacco. She reports that she does not drink alcohol or use drugs.  Allergies:  Allergies  Allergen Reactions  . Benadryl [Diphenhydramine Hcl] Other (See Comments)    Pt states that it makes her legs jump.   . Codeine Nausea And Vomiting  . Oxycodone Nausea And Vomiting and Other (See Comments)    "Makes me real irritable and I feel terrible."  . Penicillins Other (See Comments)    Pt states that it does not work for her.   Has patient had a PCN reaction causing immediate rash, facial/tongue/throat swelling, SOB or lightheadedness with hypotension: No Has patient had a PCN reaction causing severe rash involving mucus membranes or skin necrosis: No Has patient had a PCN reaction that required hospitalization: No Has patient had a PCN reaction occurring within the last 10 years: No If all of the above answers are "NO",  then may proceed with Cephalosporin use.     Medications: I have reviewed the patient's current medications. Prior to Admission:  Prior to Admission medications   Medication Sig Start Date End Date Taking? Authorizing Provider  acetaminophen (TYLENOL) 500 MG tablet Take 500 mg every 8 (eight) hours as needed by mouth for headache.   Yes [provider]  amLODipine (NORVASC) 5 MG tablet Take 5 mg by mouth every morning.    Yes [provider]  aspirin 325 MG tablet Take 1 tablet (325 mg total) by mouth daily. Patient taking differently: Take 81 mg by mouth daily.  06/25/16  Yes Dustin Flock, MD  atorvastatin (LIPITOR) 40 MG tablet Take 1 tablet (40 mg total) by mouth daily. 06/22/17 06/22/18 Yes Dustin Flock, MD  citalopram (CELEXA) 20 MG tablet Take 20 mg by mouth daily.   Yes [provider]  levETIRAcetam (KEPPRA) 500 MG tablet Take 1 tablet (500 mg total) by mouth 2 (two) times daily. 06/24/16  Yes Dustin Flock, MD  levothyroxine (SYNTHROID, LEVOTHROID) 75 MCG tablet Take 75 mcg by mouth daily before breakfast.   Yes [provider]  senna-docusate (SENOKOT-S) 8.6-50 MG tablet Take 1 tablet at bedtime as needed by mouth for mild constipation. 08/16/17  Yes Bettey Costa, MD  HYDROcodone-acetaminophen (NORCO/VICODIN) 5-325 MG  tablet Take 1 tablet every 6 (six) hours as needed by mouth for moderate pain. Patient not taking: Reported on 08/21/2017 08/16/17   Bettey Costa, MD    ROS: History obtained from the patient  General ROS: negative for - chills, fatigue, fever, night sweats, weight gain or weight loss Psychological ROS: negative for - behavioral disorder, hallucinations, memory difficulties, mood swings or suicidal ideation Ophthalmic ROS: negative for - blurry vision, double vision, eye pain or loss of vision ENT ROS: negative for - epistaxis, nasal discharge, oral lesions, sore throat, tinnitus or vertigo Allergy and Immunology ROS: negative  for - hives or itchy/watery eyes Hematological and Lymphatic ROS: negative for - bleeding problems, bruising or swollen lymph nodes Endocrine ROS: negative for - galactorrhea, hair pattern changes, polydipsia/polyuria or temperature intolerance Respiratory ROS: negative for - cough, hemoptysis, shortness of breath or wheezing Cardiovascular ROS: chest pain Gastrointestinal ROS: negative for - abdominal pain, diarrhea, hematemesis, nausea/vomiting or stool incontinence Genito-Urinary ROS: negative for - dysuria, hematuria, incontinence or urinary frequency/urgency Musculoskeletal ROS: LLE pain Neurological ROS: as noted in HPI Dermatological ROS: negative for rash and skin lesion changes  Physical Examination: Blood pressure 131/68, pulse 100, temperature 98 F (36.7 C), resp. rate 18, height 5\' 5"  (1.651 m), weight 71.7 kg (158 lb), SpO2 100 %.  HEENT-  Normocephalic, no lesions, without obvious abnormality.  Normal external eye and conjunctiva.  Normal TM's bilaterally.  Normal auditory canals and external ears. Normal external nose, mucus membranes and septum.  Normal pharynx. Cardiovascular- S1, S2 normal, pulses palpable throughout   Lungs- chest clear, no wheezing, rales, normal symmetric air entry Abdomen- soft, non-tender; bowel sounds normal; no masses,  no organomegaly Extremities- LLE in brace Lymph-no adenopathy palpable Musculoskeletal-left leg pain to palpation Skin-warm and dry, no hyperpigmentation, vitiligo, or suspicious lesions  Neurological Examination   Mental Status: Alert, oriented, thought content appropriate.  Speech fluent without evidence of aphasia.  Able to follow 3 step commands without difficulty. Cranial Nerves: II: Discs flat bilaterally; Visual fields grossly normal, pupils equal, round, reactive to light and accommodation III,IV, VI: ptosis not present, extra-ocular motions intact bilaterally V,VII: mild left facial droop, facial light touch sensation  decreased on the left VIII: hearing normal bilaterally IX,X: gag reflex present XI: bilateral shoulder shrug XII: midline tongue extension Motor: Right : Upper extremity   5/5    Left:     Upper extremity   5-/5 with increased tone and hand fisting  Lower extremity   3-/5     Lower extremity   2/5 Sensory: Pinprick and light touch decreased in the LUE and LLE Deep Tendon Reflexes: 2+ and symmetric with absent AJ's bilaterally Plantars: Right: upgoing   Left: upgoing Cerebellar: Normal finger-to-nose testing with the RUE.  LE not tested due to weakness Gait: not tested due to safety concerns    Laboratory Studies:  Basic Metabolic Panel: Recent Labs  Lab 08/14/17 1639 08/15/17 0520 08/16/17 0348 08/21/17 0929  NA 137 136 136 134*  K 4.5 3.8 3.8 4.4  CL 105 107 108 103  CO2 23 23 23 22   GLUCOSE 130* 125* 113* 122*  BUN 26* 32* 36* 19  CREATININE 1.09* 1.35* 1.11* 1.02*  CALCIUM 9.2 8.7* 8.6* 9.2    Liver Function Tests: Recent Labs  Lab 08/21/17 0929  AST 77*  ALT 32  ALKPHOS 100  BILITOT 3.5*  PROT 6.8  ALBUMIN 4.0   No results for input(s): LIPASE, AMYLASE in the last 168 hours. No  results for input(s): AMMONIA in the last 168 hours.  CBC: Recent Labs  Lab 08/14/17 1639 08/21/17 0929  WBC 8.6 7.4  NEUTROABS 6.3 5.2  HGB 11.7* 11.0*  HCT 32.6* 30.8*  MCV 101.3* 101.0*  PLT 223 219    Cardiac Enzymes: Recent Labs  Lab 08/21/17 0929  TROPONINI <0.03    BNP: Invalid input(s): POCBNP  CBG: Recent Labs  Lab 08/15/17 1238 08/15/17 1717 08/15/17 2122 08/16/17 0750 08/16/17 1215  GLUCAP 107* 118* 146* 109* 118*    Microbiology: Results for orders placed or performed during the hospital encounter of 03/11/17  Urine culture     Status: None   Collection Time: 03/11/17  8:51 AM  Result Value Ref Range Status   Specimen Description URINE, RANDOM  Final   Special Requests NONE  Final   Culture   Final    NO GROWTH Performed at [facility], [place] [address], [place], [place] [address]    Report Status 03/12/2017 FINAL  Final    Coagulation Studies: No results for input(s): LABPROT, INR in the last 72 hours.  Urinalysis: No results for input(s): COLORURINE, LABSPEC, PHURINE, GLUCOSEU, HGBUR, BILIRUBINUR, KETONESUR, PROTEINUR, UROBILINOGEN, NITRITE, LEUKOCYTESUR in the last 168 hours.  Invalid input(s): APPERANCEUR  Lipid Panel:    Component Value Date/Time   CHOL 182 06/22/2017 0417   CHOL 188 03/12/2012 0456   TRIG 103 06/22/2017 0417   TRIG 145 03/12/2012 0456   HDL 44 06/22/2017 0417   HDL 41 03/12/2012 0456   CHOLHDL 4.1 06/22/2017 0417   VLDL 21 06/22/2017 0417   VLDL 29 03/12/2012 0456   LDLCALC 117 (H) 06/22/2017 0417   LDLCALC 118 (H) 03/12/2012 0456    HgbA1C:  Lab Results  Component Value Date   HGBA1C <4.2 (L) 06/22/2017    Urine Drug Screen:      Component Value Date/Time   LABOPIA NONE DETECTED 06/21/2017 0947   COCAINSCRNUR NONE DETECTED 06/21/2017 0947   LABBENZ NONE DETECTED 06/21/2017 0947   AMPHETMU NONE DETECTED 06/21/2017 0947   THCU NONE DETECTED 06/21/2017 0947   LABBARB NONE DETECTED 06/21/2017 0947    Alcohol Level: No results for input(s): ETH in the last 168 hours.  Other results: EKG: sinus rhythm at 98 bpm.  Imaging: Dg Chest Port 1 View  Result Date: 08/21/2017 CLINICAL DATA:  Pt had chest pain upon wakening this morning and is having wheezing her chest. Hx of colon cancer, diabetes,stroke. Non smoker EXAM: PORTABLE CHEST 1 VIEW COMPARISON:  08/14/2017 FINDINGS: Numerous leads and wires project over the chest. Remote left rib fractures. Patient rotated minimally left. Midline trachea. Normal heart size. Atherosclerosis in the transverse aorta. No pleural effusion or pneumothorax. Hyperinflation. Mild biapical pleural thickening. Clear lungs. IMPRESSION: No acute cardiopulmonary disease. Aortic Atherosclerosis (ICD10-I70.0). Electronically Signed   By: Abigail Miyamoto  M.D.   On: 08/21/2017 09:25   Ct Head Code Stroke Wo Contrast`  Result Date: 08/21/2017 CLINICAL DATA:  Code stroke. Numbness in the left extremities and right-sided face. EXAM: CT HEAD WITHOUT CONTRAST TECHNIQUE: Contiguous axial images were obtained from the base of the skull through the vertex without intravenous contrast. COMPARISON:  08/14/2017 FINDINGS: Brain: No evidence of acute infarction, hemorrhage, hydrocephalus, extra-axial collection or mass-effect. Streak artifact across the basilar. Remote right lateral lenticulostriate infarct affecting basal ganglia and anterior limb internal capsule. Known 18 mm meningioma above the tentorium the left of the falx. Mild for age generalized volume loss. Vascular: Atherosclerotic calcification.  No hyperdense vessel. Skull: No acute or aggressive finding. Sinuses/Orbits: Negative Other: These results were called by telephone at the time of interpretation on 08/21/2017 at 10:33 am to Dr. Lisa Roca , who verbally acknowledged these results. ASPECTS [facility] Stroke Program Early CT Score) - Not scored with this deficit pattern. IMPRESSION: 1. No acute finding or change from prior. 2. Remote right lateral lenticulostriate infarct. 3. Known 18 mm meningioma along the left occipital convexity. Electronically Signed   By: [place] Fantasia M.D.   On: 08/21/2017 10:37    Assessment: 83 y.o. female presenting initially with chest pain but developing left sided numbness in the ED.  Symptoms improved somewhat in the ED.  Patient with similar presentation 2 months ago.  Work up at that time was unremarkable. Head CT reviewed and shows no acute changes.  Chronic right IC infarct noted.  Patient on ASA at home.    Stroke Risk Factors - diabetes mellitus  Plan: 1. MRI of the brain without contrast.  If MRI unremarkable, no further neurological work up recommended at this time.  Patient to remain on ASA. 2. Telemetry monitoring 3. Frequent neuro checks   Case  discussed with Dr. Orlie Dakin, MD Neurology [phone] 08/21/2017, 11:23 AM

## 2017-08-21 NOTE — Progress Notes (Signed)
Chaplain responded to Code Stroke Page for pt in ED Rm07. Nurse had just returned the pt to the Rm from CT. Pt was coherent and spoke to [facility] about her health challenges. No family member was present. However, pt told [place] that one of her daughter was on her way to hospital. Pt asked [place] for prayers, which the [facility] offered to pt with ministry of presence.   08/21/17 1100  Clinical Encounter Type  Visited With Patient  Visit Type Initial;Follow-up;Spiritual support;Code  Referral From Nurse  Consult/Referral To Chaplain  Spiritual Encounters  Spiritual Needs Prayer;Emotional;Other (Comment)

## 2017-08-21 NOTE — ED Notes (Signed)
Tylenol pulled for patient and she was taken to MRI. Will administer when she returns.

## 2017-08-21 NOTE — ED Notes (Signed)
Hooked patient up to monitor and gave patient a warm blanket.

## 2017-08-21 NOTE — ED Notes (Signed)
Social work and case management at bedside.

## 2017-08-21 NOTE — ED Notes (Signed)
CODE STROKE CALLED TO [phone] 

## 2018-03-04 ENCOUNTER — Encounter: Payer: Self-pay | Admitting: Emergency Medicine

## 2018-03-04 ENCOUNTER — Emergency Department
Admission: EM | Admit: 2018-03-04 | Discharge: 2018-03-04 | Disposition: A | Discharge status: Home / Self Care | Payer: Medicare Other | Attending: Emergency Medicine | Admitting: Emergency Medicine

## 2018-03-04 ENCOUNTER — Other Ambulatory Visit: Payer: Self-pay

## 2018-03-04 ENCOUNTER — Emergency Department: Payer: Medicare Other

## 2018-03-04 DIAGNOSIS — E119 Type 2 diabetes mellitus without complications: Secondary | ICD-10-CM | POA: Insufficient documentation

## 2018-03-04 DIAGNOSIS — Z7982 Long term (current) use of aspirin: Secondary | ICD-10-CM | POA: Insufficient documentation

## 2018-03-04 DIAGNOSIS — N390 Urinary tract infection, site not specified: Secondary | ICD-10-CM

## 2018-03-04 DIAGNOSIS — R319 Hematuria, unspecified: Secondary | ICD-10-CM

## 2018-03-04 DIAGNOSIS — E039 Hypothyroidism, unspecified: Secondary | ICD-10-CM | POA: Diagnosis not present

## 2018-03-04 DIAGNOSIS — R531 Weakness: Secondary | ICD-10-CM | POA: Diagnosis present

## 2018-03-04 DIAGNOSIS — N3001 Acute cystitis with hematuria: Secondary | ICD-10-CM | POA: Diagnosis not present

## 2018-03-04 DIAGNOSIS — J029 Acute pharyngitis, unspecified: Secondary | ICD-10-CM | POA: Diagnosis not present

## 2018-03-04 LAB — COMPREHENSIVE METABOLIC PANEL
ALBUMIN: 3.4 g/dL — AB (ref 3.5–5.0)
ALT: 14 U/L (ref 14–54)
ANION GAP: 9 (ref 5–15)
AST: 33 U/L (ref 15–41)
Alkaline Phosphatase: 108 U/L (ref 38–126)
BUN: 21 mg/dL — AB (ref 6–20)
CHLORIDE: 104 mmol/L (ref 101–111)
CO2: 20 mmol/L — ABNORMAL LOW (ref 22–32)
Calcium: 8.5 mg/dL — ABNORMAL LOW (ref 8.9–10.3)
Creatinine, Ser: 1.02 mg/dL — ABNORMAL HIGH (ref 0.44–1.00)
GFR calc Af Amer: 56 mL/min — ABNORMAL LOW (ref >60)
GFR calc non Af Amer: 49 mL/min — ABNORMAL LOW (ref >60)
Glucose, Bld: 126 mg/dL — ABNORMAL HIGH (ref 65–99)
POTASSIUM: 3.9 mmol/L (ref 3.5–5.1)
Sodium: 133 mmol/L — ABNORMAL LOW (ref 135–145)
Total Bilirubin: 2.4 mg/dL — ABNORMAL HIGH (ref 0.3–1.2)
Total Protein: 6.5 g/dL (ref 6.5–8.1)

## 2018-03-04 LAB — URINALYSIS, COMPLETE (UACMP) WITH MICROSCOPIC
Bilirubin Urine: NEGATIVE
Glucose, UA: NEGATIVE mg/dL
Ketones, ur: 5 mg/dL — AB
Leukocytes, UA: NEGATIVE
Nitrite: POSITIVE — AB
PROTEIN: 30 mg/dL — AB
Specific Gravity, Urine: 1.02 (ref 1.005–1.030)
pH: 5 (ref 5.0–8.0)

## 2018-03-04 LAB — CBC
HEMATOCRIT: 34.1 % — AB (ref 35.0–47.0)
HEMOGLOBIN: 12.3 g/dL (ref 12.0–16.0)
MCH: 34.4 pg — AB (ref 26.0–34.0)
MCHC: 36 g/dL (ref 32.0–36.0)
MCV: 95.4 fL (ref 80.0–100.0)
Platelets: 206 10*3/uL (ref 150–440)
RBC: 3.57 MIL/uL — ABNORMAL LOW (ref 3.80–5.20)
RDW: 13.9 % (ref 11.5–14.5)
WBC: 13.8 10*3/uL — ABNORMAL HIGH (ref 3.6–11.0)

## 2018-03-04 LAB — BASIC METABOLIC PANEL
ANION GAP: 11 (ref 5–15)
BUN: 21 mg/dL — ABNORMAL HIGH (ref 6–20)
CO2: 21 mmol/L — AB (ref 22–32)
Calcium: 9.1 mg/dL (ref 8.9–10.3)
Chloride: 100 mmol/L — ABNORMAL LOW (ref 101–111)
Creatinine, Ser: 1.17 mg/dL — ABNORMAL HIGH (ref 0.44–1.00)
GFR calc Af Amer: 48 mL/min — ABNORMAL LOW (ref >60)
GFR calc non Af Amer: 41 mL/min — ABNORMAL LOW (ref >60)
GLUCOSE: 132 mg/dL — AB (ref 65–99)
POTASSIUM: 4.2 mmol/L (ref 3.5–5.1)
Sodium: 132 mmol/L — ABNORMAL LOW (ref 135–145)

## 2018-03-04 LAB — GROUP A STREP BY PCR: GROUP A STREP BY PCR: NOT DETECTED

## 2018-03-04 MED ORDER — SODIUM CHLORIDE 0.9 % IV BOLUS
1000.0000 mL | Freq: Once | INTRAVENOUS | Status: AC
  Administered 2018-03-04: 1000 mL via INTRAVENOUS

## 2018-03-04 MED ORDER — ACETAMINOPHEN 325 MG PO TABS
650.0000 mg | ORAL_TABLET | Freq: Once | ORAL | Status: AC
  Administered 2018-03-04: 650 mg via ORAL
  Filled 2018-03-04: qty 2

## 2018-03-04 MED ORDER — CEPHALEXIN 500 MG PO CAPS: 500.0000 mg | ORAL_CAPSULE | Freq: Three times a day (TID) | ORAL | 0 refills | Status: AC

## 2018-03-04 MED ORDER — SODIUM CHLORIDE 0.9 % IV SOLN
1.0000 g | Freq: Once | INTRAVENOUS | Status: AC
  Administered 2018-03-04: 1 g via INTRAVENOUS
  Filled 2018-03-04: qty 10

## 2018-03-04 NOTE — ED Notes (Signed)
Called pt's husband to inform of pt being discharged. Pt's husband verbalized understanding and states that he will be here to pick her up.

## 2018-03-04 NOTE — ED Notes (Signed)
Pt's gold ring placed in specimen cup and placed on bedside table.

## 2018-03-04 NOTE — Discharge Instructions (Addendum)
Please take the keflex 1 pill 3 times a day for the urinary tract infection.  Please return if you are feeling worse or not better in the next 2 to 3 days.  Also please return for fever vomiting or abdominal pain.

## 2018-03-04 NOTE — ED Provider Notes (Signed)
-----------------------------------------   3:41 PM on 03/04/2018 -----------------------------------------   Assuming care from Dr. Cinda Quest.  In short, Vanessa [name] is a 84 y.o. female with a chief complaint of weakness.  Refer to the original H&P for additional details.  The current plan of care is to evaluate the results of the MRI.  Anticipate discharge if MRI is normal.    ----------------------------------------- 5:12 PM on 03/04/2018 -----------------------------------------  The patient remained stable.  MRI demonstrated the known chronic infarction but no acute or subacute changes and the neuroradiologist reports that the MRI appears stable since 2018.  I updated the patient and she was pleased that there are no acute changes and then immediately asked for a phone so that she can call her husband to come pick her up. I gave my usual and customary return precautions.  I will discharge her as per Dr. Sonny Dandy plan     Hinda Kehr, MD 03/04/18 [phone]

## 2018-03-04 NOTE — ED Triage Notes (Signed)
PT to ED via EMS from home with c/o generalized weakness xfew days. PT has hx of tia with RT side deficits . Pt A&OX4, VSS, NSR on monitor

## 2018-03-04 NOTE — ED Notes (Signed)
Transported pt to MRI

## 2018-03-04 NOTE — ED Notes (Signed)
MRI contacted about an ETA for pt's test. Possible in 30 mins.

## 2018-03-04 NOTE — ED Provider Notes (Signed)
[facility] Emergency Department Provider Note   ____________________________________________   First MD Initiated Contact with Patient 03/04/18 1128     (approximate)  I have reviewed the triage vital signs and the nursing notes.   HISTORY  Chief Complaint Weakness    HPI Vanessa [name] is a 84 y.o. female Who complains of sore throat and diffuse generalized weakness for 3 days getting worse. She does not have any pain anywhere she does not have any worsening of her focal weakness she just doesn't feel good.   Past Medical History:  Diagnosis Date  . Colon cancer ([place])   . Diabetes mellitus without complication ([place])   . Stroke ([place])   . Thyroid disorder     Patient Active Problem List   Diagnosis Date Noted  . 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 Closed tibia fracture 08/14/2017  . TIA (transient ischemic attack) 06/23/2016  . Absolute anemia 05/30/2015  . B12 deficiency 05/30/2015  . Cerebral vascular accident ([place]) 05/30/2015  . Colon cancer ([place]) 05/30/2015  . Diabetes mellitus, type 2 ([place]) 05/30/2015  . HLD (hyperlipidemia) 05/30/2015  . Heart attack ([place]) 05/30/2015  . Adult hypothyroidism 05/30/2015  . Avitaminosis D 05/30/2015  . Enthesopathy of hip 01/05/2014  . Temporary cerebral vascular dysfunction 03/02/2012    Past Surgical History:  Procedure Laterality Date  . BACK SURGERY    . BREAST SURGERY    . COLON SURGERY    . TONSILLECTOMY      Prior to Admission medications   Medication Sig Start Date End Date Taking? Authorizing Provider  acetaminophen (TYLENOL) 500 MG tablet Take 500 mg every 8 (eight) hours as needed by mouth for headache.    [provider]  amLODipine (NORVASC) 5 MG tablet Take 5 mg by mouth every morning.     [provider]  aspirin 325 MG tablet Take 1 tablet (325 mg total) by mouth daily. Patient taking differently: Take 81 mg by mouth  daily.  06/25/16   Dustin Flock, MD  atorvastatin (LIPITOR) 40 MG tablet Take 1 tablet (40 mg total) by mouth daily. 06/22/17 06/22/18  Dustin Flock, MD  cephALEXin (KEFLEX) 500 MG capsule Take 1 capsule (500 mg total) by mouth 3 (three) times daily for 10 days. 03/04/18 03/14/18  Nena Polio, MD  citalopram (CELEXA) 20 MG tablet Take 20 mg by mouth daily.    [provider]  HYDROcodone-acetaminophen (NORCO/VICODIN) 5-325 MG tablet Take 1 tablet every 6 (six) hours as needed by mouth for moderate pain. Patient not taking: Reported on 08/21/2017 08/16/17   Bettey Costa, MD  levETIRAcetam (KEPPRA) 500 MG tablet Take 1 tablet (500 mg total) by mouth 2 (two) times daily. 06/24/16   Dustin Flock, MD  levothyroxine (SYNTHROID, LEVOTHROID) 75 MCG tablet Take 75 mcg by mouth daily before breakfast.    [provider]  predniSONE (DELTASONE) 10 MG tablet 4 tablets daily for 4 more days 08/21/17   Lisa Roca, MD  senna-docusate (SENOKOT-S) 8.6-50 MG tablet Take 1 tablet at bedtime as needed by mouth for mild constipation. 08/16/17   Bettey Costa, MD    Allergies Benadryl [diphenhydramine hcl]; Codeine; Oxycodone; and Penicillins  Family History  Problem Relation Age of Onset  . Breast cancer Mother   . Heart attack Father   . Diabetes Brother     Social History Social History   Tobacco Use  . Smoking status: Never Smoker  . Smokeless tobacco:  Never Used  Substance Use Topics  . Alcohol use: No  . Drug use: No    Review of Systems  Constitutional: No fever/chills Eyes: No visual changes. ENT:  sore throat. Cardiovascular: Denies chest pain. Respiratory: Denies shortness of breath. Gastrointestinal: No abdominal pain.  No nausea, no vomiting.  No diarrhea.  No constipation. Genitourinary: Negative for dysuria. Musculoskeletal: Negative for back pain. Skin: Negative for rash. Neurological: Negative for headaches, focal weakness    ____________________________________________   PHYSICAL EXAM:  VITAL SIGNS: ED Triage Vitals  Enc Vitals Group     BP 03/04/18 1040 120/67     Pulse Rate 03/04/18 1040 98     Resp 03/04/18 1040 16     Temp 03/04/18 1040 97.6 F (36.4 C)     Temp Source 03/04/18 1040 Oral     SpO2 03/04/18 1040 97 %     Weight --      Height --      Head Circumference --      Peak Flow --      Pain Score 03/04/18 1041 0     Pain Loc --      Pain Edu? --      Excl. in [place]? --     Constitutional: Alert and oriented. Well appearing and in no acute distress. Eyes: Conjunctivae are normal. PER. EOMI. Head: Atraumatic. Nose: No congestion/rhinnorhea. Mouth/Throat: Mucous membranes are moist.  Oropharynx non-erythematous. Neck: No stridor. Cardiovascular: Normal rate, regular rhythm. Grossly normal heart sounds.  Good peripheral circulation. Respiratory: Normal respiratory effort.  No retractions. Lungs CTAB. Gastrointestinal: Soft and nontender. No distention. No abdominal bruits. No CVA tenderness. Musculoskeletal: No lower extremity tenderness nor edema.   Neurologic:  Normal speech and language. No new gross focal neurologic deficits are appreciated.she does haqve l sided weakness Skin:  Skin is warm, dry and intact. No rash noted. Psychiatric: Mood and affect are normal. Speech and behavior are normal.  ____________________________________________   LABS (all labs ordered are listed, but only abnormal results are displayed)  Labs Reviewed  BASIC METABOLIC PANEL - Abnormal; Notable for the following components:      Result Value   Sodium 132 (*)    Chloride 100 (*)    CO2 21 (*)    Glucose, Bld 132 (*)    BUN 21 (*)    Creatinine, Ser 1.17 (*)    GFR calc non Af Amer 41 (*)    GFR calc Af Amer 48 (*)    All other components within normal limits  CBC - Abnormal; Notable for the following components:   WBC 13.8 (*)    RBC 3.57 (*)    HCT 34.1 (*)    MCH 34.4 (*)    All other  components within normal limits  URINALYSIS, COMPLETE (UACMP) WITH MICROSCOPIC - Abnormal; Notable for the following components:   Color, Urine AMBER (*)    APPearance CLEAR (*)    Hgb urine dipstick MODERATE (*)    Ketones, ur 5 (*)    Protein, ur 30 (*)    Nitrite POSITIVE (*)    Bacteria, UA MANY (*)    All other components within normal limits  COMPREHENSIVE METABOLIC PANEL - Abnormal; Notable for the following components:   Sodium 133 (*)    CO2 20 (*)    Glucose, Bld 126 (*)    BUN 21 (*)    Creatinine, Ser 1.02 (*)    Calcium 8.5 (*)    Albumin 3.4 (*)  Total Bilirubin 2.4 (*)    GFR calc non Af Amer 49 (*)    GFR calc Af Amer 56 (*)    All other components within normal limits  GROUP A STREP BY PCR  CBG MONITORING, ED   ____________________________________________  EKG  EKG read and interpreted by me shows normal sinus rhythm rate of 98 left axis incomplete right bundle branch block no acute ST-T changes ____________________________________________  RADIOLOGY  ED MD interpretation: MRI still pending  Official radiology report(s): Dg Chest Portable 1 View  Result Date: 03/04/2018 CLINICAL DATA:  Several day history of generalized weakness. Previous history of TIA-CVA with residual right-sided deficits. EXAM: PORTABLE CHEST 1 VIEW COMPARISON:  Portable chest x-ray of August 21, 2017 FINDINGS: The lungs are mildly hyperinflated. The interstitial markings are coarse though stable. There is no alveolar infiltrate or pleural effusion. The heart and pulmonary vascularity are normal. There is calcification in the wall of the aortic arch. The observed bony thorax is unremarkable. IMPRESSION: Chronic bronchitic changes, stable. No pneumonia, CHF, nor other acute cardiopulmonary abnormality. Thoracic aortic atherosclerosis. Electronically Signed   By: David  [place] M.D.   On: 03/04/2018 12:27    ____________________________________________   PROCEDURES  Procedure(s)  performed: `1  Procedures  Critical Care performed:   ____________________________________________   INITIAL IMPRESSION / ASSESSMENT AND PLAN / ED COURSE  ----------------------------------------- 1:35 PM on 03/04/2018 -----------------------------------------  Patient says she's had right hand weakness for a week. She hadn't told me that before otherwise everything seems to be okay except for old left-sided weakness. I will get an MRI of her head. I'll give her some Rocephin for UTI and recheck her BMP to see if it's better after the fluids. Her penicillin "allergy" is "it doesn't work for her". I have asked her now 3 times if she having any trouble with her breathing because she's breathing fairly rapidly but she says this is the way she always breathes in her breathing does not feel any different than normally.  I have signed patient out to Dr. Karma Greaser.  The plan is if the MRI does not show any acute findings I will let her go with the Keflex prescription which I have written for her.  She will return for any further problems   Clinical Course as of Mar 05 1547  Mon Mar 04, 2018  1530 Nitrite(!): POSITIVE [PM]    Clinical Course User Index [PM] Nena Polio, MD     ____________________________________________   FINAL CLINICAL IMPRESSION(S) / ED DIAGNOSES  Final diagnoses:  Weakness  Urinary tract infection with hematuria, site unspecified     ED Discharge Orders        Ordered    cephALEXin (KEFLEX) 500 MG capsule  3 times daily     03/04/18 1531       Note:  This document was prepared using Dragon voice recognition software and may include unintentional dictation errors.    Nena Polio, MD 03/04/18 [phone]

## 2018-03-04 NOTE — ED Notes (Addendum)
Pt back from MRI and ring placed back on pt's finger.

## 2018-03-10 ENCOUNTER — Other Ambulatory Visit: Payer: Self-pay

## 2018-03-10 ENCOUNTER — Emergency Department: Payer: Medicare Other

## 2018-03-10 ENCOUNTER — Inpatient Hospital Stay
Admission: EM | Admit: 2018-03-10 | Discharge: 2018-04-01 | DRG: 871 | Disposition: E | Payer: Medicare Other | Attending: Specialist | Admitting: Specialist

## 2018-03-10 DIAGNOSIS — Z79890 Hormone replacement therapy (postmenopausal): Secondary | ICD-10-CM | POA: Diagnosis not present

## 2018-03-10 DIAGNOSIS — Z880 Allergy status to penicillin: Secondary | ICD-10-CM

## 2018-03-10 DIAGNOSIS — E440 Moderate protein-calorie malnutrition: Secondary | ICD-10-CM | POA: Diagnosis present

## 2018-03-10 DIAGNOSIS — R079 Chest pain, unspecified: Secondary | ICD-10-CM | POA: Diagnosis not present

## 2018-03-10 DIAGNOSIS — J9601 Acute respiratory failure with hypoxia: Secondary | ICD-10-CM | POA: Diagnosis present

## 2018-03-10 DIAGNOSIS — Z8673 Personal history of transient ischemic attack (TIA), and cerebral infarction without residual deficits: Secondary | ICD-10-CM

## 2018-03-10 DIAGNOSIS — G40909 Epilepsy, unspecified, not intractable, without status epilepticus: Secondary | ICD-10-CM | POA: Diagnosis present

## 2018-03-10 DIAGNOSIS — R410 Disorientation, unspecified: Secondary | ICD-10-CM | POA: Diagnosis not present

## 2018-03-10 DIAGNOSIS — G9341 Metabolic encephalopathy: Secondary | ICD-10-CM | POA: Diagnosis present

## 2018-03-10 DIAGNOSIS — E872 Acidosis: Secondary | ICD-10-CM | POA: Diagnosis present

## 2018-03-10 DIAGNOSIS — N179 Acute kidney failure, unspecified: Secondary | ICD-10-CM | POA: Diagnosis present

## 2018-03-10 DIAGNOSIS — E119 Type 2 diabetes mellitus without complications: Secondary | ICD-10-CM | POA: Diagnosis present

## 2018-03-10 DIAGNOSIS — Z885 Allergy status to narcotic agent status: Secondary | ICD-10-CM | POA: Diagnosis not present

## 2018-03-10 DIAGNOSIS — R34 Anuria and oliguria: Secondary | ICD-10-CM | POA: Diagnosis not present

## 2018-03-10 DIAGNOSIS — J440 Chronic obstructive pulmonary disease with acute lower respiratory infection: Secondary | ICD-10-CM | POA: Diagnosis present

## 2018-03-10 DIAGNOSIS — Z515 Encounter for palliative care: Secondary | ICD-10-CM | POA: Diagnosis present

## 2018-03-10 DIAGNOSIS — Z7982 Long term (current) use of aspirin: Secondary | ICD-10-CM

## 2018-03-10 DIAGNOSIS — D649 Anemia, unspecified: Secondary | ICD-10-CM | POA: Diagnosis present

## 2018-03-10 DIAGNOSIS — A419 Sepsis, unspecified organism: Secondary | ICD-10-CM | POA: Diagnosis present

## 2018-03-10 DIAGNOSIS — R6521 Severe sepsis with septic shock: Secondary | ICD-10-CM | POA: Diagnosis present

## 2018-03-10 DIAGNOSIS — Z8249 Family history of ischemic heart disease and other diseases of the circulatory system: Secondary | ICD-10-CM | POA: Diagnosis not present

## 2018-03-10 DIAGNOSIS — R0603 Acute respiratory distress: Secondary | ICD-10-CM | POA: Diagnosis not present

## 2018-03-10 DIAGNOSIS — I4891 Unspecified atrial fibrillation: Secondary | ICD-10-CM | POA: Diagnosis not present

## 2018-03-10 DIAGNOSIS — I471 Supraventricular tachycardia: Secondary | ICD-10-CM | POA: Diagnosis not present

## 2018-03-10 DIAGNOSIS — R6520 Severe sepsis without septic shock: Secondary | ICD-10-CM | POA: Diagnosis not present

## 2018-03-10 DIAGNOSIS — Z8674 Personal history of sudden cardiac arrest: Secondary | ICD-10-CM | POA: Diagnosis not present

## 2018-03-10 DIAGNOSIS — Z85038 Personal history of other malignant neoplasm of large intestine: Secondary | ICD-10-CM | POA: Diagnosis not present

## 2018-03-10 DIAGNOSIS — Z7189 Other specified counseling: Secondary | ICD-10-CM | POA: Diagnosis not present

## 2018-03-10 DIAGNOSIS — E039 Hypothyroidism, unspecified: Secondary | ICD-10-CM | POA: Diagnosis present

## 2018-03-10 DIAGNOSIS — Z79899 Other long term (current) drug therapy: Secondary | ICD-10-CM | POA: Diagnosis not present

## 2018-03-10 DIAGNOSIS — J181 Lobar pneumonia, unspecified organism: Secondary | ICD-10-CM | POA: Diagnosis present

## 2018-03-10 DIAGNOSIS — J9600 Acute respiratory failure, unspecified whether with hypoxia or hypercapnia: Secondary | ICD-10-CM

## 2018-03-10 DIAGNOSIS — Z4659 Encounter for fitting and adjustment of other gastrointestinal appliance and device: Secondary | ICD-10-CM

## 2018-03-10 DIAGNOSIS — D329 Benign neoplasm of meninges, unspecified: Secondary | ICD-10-CM | POA: Diagnosis present

## 2018-03-10 DIAGNOSIS — Z66 Do not resuscitate: Secondary | ICD-10-CM | POA: Diagnosis present

## 2018-03-10 DIAGNOSIS — I959 Hypotension, unspecified: Secondary | ICD-10-CM | POA: Diagnosis not present

## 2018-03-10 DIAGNOSIS — J9690 Respiratory failure, unspecified, unspecified whether with hypoxia or hypercapnia: Secondary | ICD-10-CM

## 2018-03-10 DIAGNOSIS — Z0189 Encounter for other specified special examinations: Secondary | ICD-10-CM

## 2018-03-10 LAB — URINALYSIS, ROUTINE W REFLEX MICROSCOPIC
BILIRUBIN URINE: NEGATIVE
Bacteria, UA: NONE SEEN
GLUCOSE, UA: NEGATIVE mg/dL
HGB URINE DIPSTICK: NEGATIVE
Ketones, ur: NEGATIVE mg/dL
LEUKOCYTES UA: NEGATIVE
NITRITE: NEGATIVE
Protein, ur: 30 mg/dL — AB
SPECIFIC GRAVITY, URINE: 1.02 (ref 1.005–1.030)
WBC UA: NONE SEEN WBC/hpf (ref 0–5)
pH: 5 (ref 5.0–8.0)

## 2018-03-10 LAB — CBC WITH DIFFERENTIAL/PLATELET
BASOS ABS: 0 10*3/uL (ref 0–0.1)
BASOS PCT: 0 %
Eosinophils Absolute: 0 10*3/uL (ref 0–0.7)
Eosinophils Relative: 0 %
HEMATOCRIT: 29 % — AB (ref 35.0–47.0)
Hemoglobin: 10.4 g/dL — ABNORMAL LOW (ref 12.0–16.0)
LYMPHS PCT: 3 %
Lymphs Abs: 1 10*3/uL (ref 1.0–3.6)
MCH: 33.1 pg (ref 26.0–34.0)
MCHC: 35.7 g/dL (ref 32.0–36.0)
MCV: 92.8 fL (ref 80.0–100.0)
MONO ABS: 2 10*3/uL — AB (ref 0.2–0.9)
Monocytes Relative: 6 %
Neutro Abs: 31.2 10*3/uL — ABNORMAL HIGH (ref 1.4–6.5)
Neutrophils Relative %: 91 %
Platelets: 243 10*3/uL (ref 150–440)
RBC: 3.13 MIL/uL — AB (ref 3.80–5.20)
RDW: 15.1 % — AB (ref 11.5–14.5)
WBC: 34.2 10*3/uL — ABNORMAL HIGH (ref 3.6–11.0)

## 2018-03-10 LAB — COMPREHENSIVE METABOLIC PANEL
ALT: 14 U/L (ref 14–54)
AST: 57 U/L — AB (ref 15–41)
Albumin: 3.1 g/dL — ABNORMAL LOW (ref 3.5–5.0)
Alkaline Phosphatase: 118 U/L (ref 38–126)
Anion gap: 9 (ref 5–15)
BUN: 38 mg/dL — AB (ref 6–20)
CO2: 22 mmol/L (ref 22–32)
CREATININE: 1.35 mg/dL — AB (ref 0.44–1.00)
Calcium: 8.3 mg/dL — ABNORMAL LOW (ref 8.9–10.3)
Chloride: 104 mmol/L (ref 101–111)
GFR calc non Af Amer: 35 mL/min — ABNORMAL LOW (ref >60)
GFR, EST AFRICAN AMERICAN: 40 mL/min — AB (ref >60)
Glucose, Bld: 196 mg/dL — ABNORMAL HIGH (ref 65–99)
POTASSIUM: 4 mmol/L (ref 3.5–5.1)
SODIUM: 135 mmol/L (ref 135–145)
Total Bilirubin: 2.4 mg/dL — ABNORMAL HIGH (ref 0.3–1.2)
Total Protein: 6.4 g/dL — ABNORMAL LOW (ref 6.5–8.1)

## 2018-03-10 LAB — TROPONIN I

## 2018-03-10 LAB — LIPASE, BLOOD: LIPASE: 28 U/L (ref 11–51)

## 2018-03-10 LAB — TRIGLYCERIDES: Triglycerides: 148 mg/dL (ref <150)

## 2018-03-10 LAB — GLUCOSE, CAPILLARY: Glucose-Capillary: 252 mg/dL — ABNORMAL HIGH (ref 65–99)

## 2018-03-10 LAB — PROTIME-INR
INR: 1.32
PROTHROMBIN TIME: 16.3 s — AB (ref 11.4–15.2)

## 2018-03-10 LAB — PROCALCITONIN: Procalcitonin: 2.95 ng/mL

## 2018-03-10 LAB — MRSA PCR SCREENING: MRSA BY PCR: NEGATIVE

## 2018-03-10 LAB — LACTIC ACID, PLASMA
LACTIC ACID, VENOUS: 2 mmol/L — AB (ref 0.5–1.9)
LACTIC ACID, VENOUS: 1.8 mmol/L (ref 0.5–1.9)

## 2018-03-10 MED ORDER — SODIUM CHLORIDE 0.9 % IV SOLN
2.0000 g | Freq: Once | INTRAVENOUS | Status: AC
  Administered 2018-03-10: 2 g via INTRAVENOUS
  Filled 2018-03-10: qty 2

## 2018-03-10 MED ORDER — ORAL CARE MOUTH RINSE
15.0000 mL | OROMUCOSAL | Status: DC
  Administered 2018-03-11 (×6): 15 mL via OROMUCOSAL

## 2018-03-10 MED ORDER — ASPIRIN 300 MG RE SUPP
RECTAL | Status: AC
  Administered 2018-03-10: 300 mg via RECTAL
  Filled 2018-03-10: qty 1

## 2018-03-10 MED ORDER — FENTANYL CITRATE (PF) 100 MCG/2ML IJ SOLN
100.0000 ug | Freq: Once | INTRAMUSCULAR | Status: AC
  Administered 2018-03-10: 100 ug via INTRAVENOUS

## 2018-03-10 MED ORDER — FENTANYL 2500MCG IN NS 250ML (10MCG/ML) PREMIX INFUSION
100.0000 ug/h | INTRAVENOUS | Status: DC
  Administered 2018-03-10: 25 ug/h via INTRAVENOUS
  Filled 2018-03-10: qty 250

## 2018-03-10 MED ORDER — AMIODARONE LOAD VIA INFUSION
150.0000 mg | Freq: Once | INTRAVENOUS | Status: AC
  Administered 2018-03-10: 150 mg via INTRAVENOUS
  Filled 2018-03-10: qty 83.34

## 2018-03-10 MED ORDER — AMIODARONE HCL IN DEXTROSE 360-4.14 MG/200ML-% IV SOLN
30.0000 mg/h | INTRAVENOUS | Status: DC
  Administered 2018-03-10 – 2018-03-11 (×2): 30 mg/h via INTRAVENOUS
  Filled 2018-03-10: qty 200

## 2018-03-10 MED ORDER — PROPOFOL 1000 MG/100ML IV EMUL
5.0000 ug/kg/min | Freq: Once | INTRAVENOUS | Status: AC
  Administered 2018-03-10: 5 ug/kg/min via INTRAVENOUS
  Filled 2018-03-10: qty 100

## 2018-03-10 MED ORDER — ONDANSETRON HCL 4 MG/2ML IJ SOLN: 4.0000 mg | Freq: Four times a day (QID) | INTRAMUSCULAR | Status: DC | PRN

## 2018-03-10 MED ORDER — PROPOFOL 1000 MG/100ML IV EMUL
5.0000 ug/kg/min | INTRAVENOUS | Status: DC
  Administered 2018-03-10: 20 ug/kg/min via INTRAVENOUS
  Filled 2018-03-10: qty 100

## 2018-03-10 MED ORDER — VANCOMYCIN HCL IN DEXTROSE 1-5 GM/200ML-% IV SOLN
1000.0000 mg | INTRAVENOUS | Status: DC
  Administered 2018-03-11: 1000 mg via INTRAVENOUS
  Filled 2018-03-10 (×2): qty 200

## 2018-03-10 MED ORDER — ACETAMINOPHEN 650 MG RE SUPP
650.0000 mg | Freq: Four times a day (QID) | RECTAL | Status: DC | PRN
  Administered 2018-03-12 – 2018-03-13 (×2): 650 mg via RECTAL
  Filled 2018-03-10 (×2): qty 1

## 2018-03-10 MED ORDER — LEVETIRACETAM IN NACL 500 MG/100ML IV SOLN
500.0000 mg | Freq: Two times a day (BID) | INTRAVENOUS | Status: DC
  Filled 2018-03-10 (×3): qty 100

## 2018-03-10 MED ORDER — SUCCINYLCHOLINE CHLORIDE 20 MG/ML IJ SOLN
140.0000 mg | Freq: Once | INTRAMUSCULAR | Status: AC
  Administered 2018-03-10: 140 mg via INTRAVENOUS

## 2018-03-10 MED ORDER — SODIUM CHLORIDE 0.9 % IV SOLN
2.0000 g | INTRAVENOUS | Status: DC
  Filled 2018-03-10: qty 2

## 2018-03-10 MED ORDER — VANCOMYCIN HCL IN DEXTROSE 1-5 GM/200ML-% IV SOLN
1000.0000 mg | Freq: Once | INTRAVENOUS | Status: AC
Start: 2018-03-10 — End: 2018-03-10
  Administered 2018-03-10: 1000 mg via INTRAVENOUS
  Filled 2018-03-10: qty 200

## 2018-03-10 MED ORDER — KETAMINE HCL 10 MG/ML IJ SOLN
140.0000 mg | Freq: Once | INTRAMUSCULAR | Status: AC
  Administered 2018-03-10: 140 mg via INTRAVENOUS

## 2018-03-10 MED ORDER — ACETAMINOPHEN 325 MG PO TABS: 650.0000 mg | ORAL_TABLET | Freq: Four times a day (QID) | ORAL | Status: DC | PRN

## 2018-03-10 MED ORDER — SODIUM CHLORIDE 0.9 % IV BOLUS
1000.0000 mL | Freq: Once | INTRAVENOUS | Status: AC
  Administered 2018-03-10: 1000 mL via INTRAVENOUS

## 2018-03-10 MED ORDER — ONDANSETRON HCL 4 MG PO TABS: 4.0000 mg | ORAL_TABLET | Freq: Four times a day (QID) | ORAL | Status: DC | PRN

## 2018-03-10 MED ORDER — ENOXAPARIN SODIUM 40 MG/0.4ML ~~LOC~~ SOLN
40.0000 mg | SUBCUTANEOUS | Status: DC
  Administered 2018-03-10: 40 mg via SUBCUTANEOUS
  Filled 2018-03-10: qty 0.4

## 2018-03-10 MED ORDER — AMIODARONE HCL IN DEXTROSE 360-4.14 MG/200ML-% IV SOLN
60.0000 mg/h | INTRAVENOUS | Status: AC
  Administered 2018-03-10: 60 mg/h via INTRAVENOUS
  Filled 2018-03-10 (×2): qty 200

## 2018-03-10 MED ORDER — IPRATROPIUM-ALBUTEROL 0.5-2.5 (3) MG/3ML IN SOLN
3.0000 mL | Freq: Once | RESPIRATORY_TRACT | Status: AC
  Administered 2018-03-10: 3 mL via RESPIRATORY_TRACT
  Filled 2018-03-10: qty 3

## 2018-03-10 MED ORDER — SODIUM CHLORIDE 0.9 % IV SOLN
Freq: Two times a day (BID) | INTRAVENOUS | Status: DC
  Administered 2018-03-11 – 2018-03-14 (×7): via INTRAVENOUS
  Filled 2018-03-10: qty 5
  Filled 2018-03-10: qty 500
  Filled 2018-03-10 (×5): qty 5
  Filled 2018-03-10 (×4): qty 500
  Filled 2018-03-10: qty 5

## 2018-03-10 MED ORDER — LEVOTHYROXINE SODIUM 50 MCG PO TABS
75.0000 ug | ORAL_TABLET | Freq: Every day | ORAL | Status: DC
  Administered 2018-03-11: 75 ug via NASOGASTRIC
  Filled 2018-03-10: qty 2

## 2018-03-10 MED ORDER — ASPIRIN 300 MG RE SUPP
300.0000 mg | Freq: Every day | RECTAL | Status: DC
  Administered 2018-03-10 – 2018-03-11 (×2): 300 mg via RECTAL
  Filled 2018-03-10: qty 1

## 2018-03-10 MED ORDER — CHLORHEXIDINE GLUCONATE 0.12% ORAL RINSE (MEDLINE KIT)
15.0000 mL | Freq: Two times a day (BID) | OROMUCOSAL | Status: DC
  Administered 2018-03-10 – 2018-03-11 (×2): 15 mL via OROMUCOSAL

## 2018-03-10 NOTE — Progress Notes (Signed)
Patient ID: Vanessa [name], female   DOB: 1931-02-27, 84 y.o.   MRN: [id number]   I did speak with the daughter on the phone who advised me to call the patient's husband at the home phone number.  I gave the daughter and update on the phone.  I spoke with the husband on the phone it was a very short conversation he stated he did not want to lose his wife.  I will keep the patient full code at this point.  Dr. Loletha Grayer

## 2018-03-10 NOTE — Progress Notes (Signed)
Pharmacy Antibiotic Note  Vanessa [name] is a 84 y.o. female admitted on 03/02/2018 with pneumonia.  Pharmacy has been consulted for Cefepime and vanco dosing.  Plan: Cefepime 2g IV q24h (CrCl 39.1 ml/min)   Vancomycin 1g IV x once followed by vancomycin 1g IV q36h with a 16 hour stack dose.   Estimated Cmin 17.7 with a goal trough 15-20. Will check first trough before 4th dose. Patient's ScCr=1.35 a little over baseline. Will continue to follow kidney function and adjust antibiotics per protocol.   ke 0.029, t1/2 23.90, Vd 38.3, IBW 54.7kg    Height: 5\' 4"  (162.6 cm) Weight: 158 lb (71.7 kg) IBW/kg (Calculated) : 54.7  Temp (24hrs), Avg:99 F (37.2 C), Min:99 F (37.2 C), Max:99 F (37.2 C)  Recent Labs  Lab 03/04/18 1043 03/04/18 1340  WBC 13.8*  --   CREATININE 1.17* 1.02*    Estimated Creatinine Clearance: 39.1 mL/min (A) (by C-G formula based on SCr of 1.02 mg/dL (H)).    Allergies  Allergen Reactions  . Benadryl [Diphenhydramine Hcl] Other (See Comments)    Pt states that it makes her legs jump.   . Codeine Nausea And Vomiting  . Oxycodone Nausea And Vomiting and Other (See Comments)    "Makes me real irritable and I feel terrible."  . Penicillins Other (See Comments)    Pt states that it does not work for her.   Has patient had a PCN reaction causing immediate rash, facial/tongue/throat swelling, SOB or lightheadedness with hypotension: No Has patient had a PCN reaction causing severe rash involving mucus membranes or skin necrosis: No Has patient had a PCN reaction that required hospitalization: No Has patient had a PCN reaction occurring within the last 10 years: No If all of the above answers are "NO", then may proceed with Cephalosporin use.     Antimicrobials this admission: 6/9 Vanco >>  6/9 Cefepime >>   Dose adjustments this admission:   Microbiology results: 6/9 BCx: Sent 6/9 UCx: Sent 6/9 MRSA PCR: Ordered   Thank you for allowing pharmacy  to be a part of this patient's care.  Candelaria Stagers, PharmD Pharmacy Resident  03/17/2018 1:51 PM

## 2018-03-10 NOTE — ED Notes (Signed)
[name], daughter [phone]

## 2018-03-10 NOTE — ED Provider Notes (Signed)
[facility] Emergency Department Provider Note  ____________________________________________   First MD Initiated Contact with Patient 03/20/2018 1332     (approximate)  I have reviewed the triage vital signs and the nursing notes.   HISTORY  Chief Complaint Blood Infection  Level 5 exemption history limited by the patient's critical illness  HPI Vanessa [name] is a 84 y.o. female who comes to the emergency department by EMS with shortness of breath and fever that began today.  According to EMS the patient was unable to provide much history but she was 102.9 degrees and breathing heavily with heart rate in the 130s.  The patient is confused and unable to provide any meaningful history herself.  Apparently she had a urinary tract infection recently.  Past Medical History:  Diagnosis Date  . Colon cancer ([place])   . Diabetes mellitus without complication ([place])   . Stroke ([place])   . Thyroid disorder     Patient Active Problem List   Diagnosis Date Noted  . Acute respiratory failure with hypoxia ([place]) 03/03/2018  . 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 Closed tibia fracture 08/14/2017  . TIA (transient ischemic attack) 06/23/2016  . Absolute anemia 05/30/2015  . B12 deficiency 05/30/2015  . Cerebral vascular accident ([place]) 05/30/2015  . Colon cancer ([place]) 05/30/2015  . Diabetes mellitus, type 2 ([place]) 05/30/2015  . HLD (hyperlipidemia) 05/30/2015  . Heart attack ([place]) 05/30/2015  . Adult hypothyroidism 05/30/2015  . Avitaminosis D 05/30/2015  . Enthesopathy of hip 01/05/2014  . Temporary cerebral vascular dysfunction 03/02/2012    Past Surgical History:  Procedure Laterality Date  . BACK SURGERY    . BREAST SURGERY    . COLON SURGERY    . TONSILLECTOMY      Prior to Admission medications   Medication Sig Start Date End Date Taking? Authorizing Provider  acetaminophen (TYLENOL) 500 MG tablet Take 500 mg  every 8 (eight) hours as needed by mouth for headache.   Yes [provider]  amLODipine (NORVASC) 5 MG tablet Take 5 mg by mouth every morning.    Yes [provider]  aspirin 325 MG tablet Take 1 tablet (325 mg total) by mouth daily. Patient taking differently: Take 81 mg by mouth daily.  06/25/16  Yes Dustin Flock, MD  atorvastatin (LIPITOR) 40 MG tablet Take 1 tablet (40 mg total) by mouth daily. 06/22/17 06/22/18 Yes Dustin Flock, MD  cephALEXin (KEFLEX) 500 MG capsule Take 1 capsule (500 mg total) by mouth 3 (three) times daily for 10 days. 03/04/18 03/14/18 Yes Nena Polio, MD  citalopram (CELEXA) 20 MG tablet Take 20 mg by mouth daily.   Yes [provider]  levETIRAcetam (KEPPRA) 500 MG tablet Take 1 tablet (500 mg total) by mouth 2 (two) times daily. 06/24/16  Yes Dustin Flock, MD  levothyroxine (SYNTHROID, LEVOTHROID) 75 MCG tablet Take 75 mcg by mouth daily before breakfast.   Yes [provider]  senna-docusate (SENOKOT-S) 8.6-50 MG tablet Take 1 tablet at bedtime as needed by mouth for mild constipation. 08/16/17  Yes Bettey Costa, MD    Allergies Benadryl [diphenhydramine hcl]; Codeine; Oxycodone; and Penicillins  Family History  Problem Relation Age of Onset  . Breast cancer Mother   . Heart attack Father   . Diabetes Brother     Social History Social History   Tobacco Use  . Smoking status: Never Smoker  . Smokeless tobacco: Never Used  Substance Use  Topics  . Alcohol use: No  . Drug use: No    Review of Systems Level 5 exemption history limited by the patient's critical illness  ____________________________________________   PHYSICAL EXAM:  VITAL SIGNS: ED Triage Vitals  Enc Vitals Group     BP      Pulse      Resp      Temp      Temp src      SpO2      Weight      Height      Head Circumference      Peak Flow      Pain Score      Pain Loc      Pain Edu?      Excl. in [place]?     Constitutional: Appears  critically ill.  Knows her name and the president but does not know the year where she has or why she is here gasping for air speaking in 1-2 word sentences Eyes: PERRL EOMI. midrange and brisk Head: Atraumatic. Nose: No congestion/rhinnorhea. Mouth/Throat: No trismus Neck: No stridor.   Cardiovascular: Tachycardic rate, regular rhythm. Grossly normal heart sounds.  Good peripheral circulation. Respiratory: Severe respiratory distress using all accessory muscles lungs are actually clear to auscultation Gastrointestinal: Soft nontender Musculoskeletal: No lower extremity edema   Neurologic: Moves all 4 Skin: Diaphoretic Psychiatric: Encephalopathic    ____________________________________________   DIFFERENTIAL includes but not limited to  Sepsis, pneumonia, urinary tract infection, intracerebral hemorrhage, metabolic derangement ____________________________________________   LABS (all labs ordered are listed, but only abnormal results are displayed)  Labs Reviewed  BLOOD CULTURE ID PANEL (REFLEXED) - Abnormal; Notable for the following components:      Result Value   Staphylococcus species DETECTED (*)    All other components within normal limits  BLOOD GAS, VENOUS - Abnormal; Notable for the following components:   pCO2, Ven 36 (*)    All other components within normal limits  LACTIC ACID, PLASMA - Abnormal; Notable for the following components:   Lactic Acid, Venous 2.0 (*)    All other components within normal limits  COMPREHENSIVE METABOLIC PANEL - Abnormal; Notable for the following components:   Glucose, Bld 196 (*)    BUN 38 (*)    Creatinine, Ser 1.35 (*)    Calcium 8.3 (*)    Total Protein 6.4 (*)    Albumin 3.1 (*)    AST 57 (*)    Total Bilirubin 2.4 (*)    GFR calc non Af Amer 35 (*)    GFR calc Af Amer 40 (*)    All other components within normal limits  CBC WITH DIFFERENTIAL/PLATELET - Abnormal; Notable for the following components:   WBC 34.2 (*)     RBC 3.13 (*)    Hemoglobin 10.4 (*)    HCT 29.0 (*)    RDW 15.1 (*)    Neutro Abs 31.2 (*)    Monocytes Absolute 2.0 (*)    All other components within normal limits  PROTIME-INR - Abnormal; Notable for the following components:   Prothrombin Time 16.3 (*)    All other components within normal limits  URINALYSIS, ROUTINE W REFLEX MICROSCOPIC - Abnormal; Notable for the following components:   Color, Urine AMBER (*)    APPearance CLOUDY (*)    Protein, ur 30 (*)    All other components within normal limits  GLUCOSE, CAPILLARY - Abnormal; Notable for the following components:   Glucose-Capillary 252 (*)  All other components within normal limits  BASIC METABOLIC PANEL - Abnormal; Notable for the following components:   Sodium 133 (*)    CO2 21 (*)    Glucose, Bld 322 (*)    BUN 47 (*)    Creatinine, Ser 1.54 (*)    Calcium 8.3 (*)    GFR calc non Af Amer 30 (*)    GFR calc Af Amer 34 (*)    All other components within normal limits  CBC - Abnormal; Notable for the following components:   WBC 33.9 (*)    RBC 2.90 (*)    Hemoglobin 9.8 (*)    HCT 26.9 (*)    MCHC 36.7 (*)    RDW 15.0 (*)    All other components within normal limits  GLUCOSE, CAPILLARY - Abnormal; Notable for the following components:   Glucose-Capillary 259 (*)    All other components within normal limits  CULTURE, BLOOD (ROUTINE X 2)  CULTURE, BLOOD (ROUTINE X 2)  URINE CULTURE  MRSA PCR SCREENING  MRSA PCR SCREENING  LACTIC ACID, PLASMA  LIPASE, BLOOD  TROPONIN I  PROCALCITONIN  PROCALCITONIN  TRIGLYCERIDES  MAGNESIUM  PHOSPHORUS    Lab work reviewed by me with a number of abnormalities most notably an extremely elevated white count concerning for severe sepsis __________________________________________  EKG  ED ECG REPORT I, Darel Hong, the attending physician, personally viewed and interpreted this ECG.  Date: 03/28/2018 EKG Time:  Rate: 132 Rhythm: normal sinus rhythm QRS Axis:  Leftward axis Intervals: normal ST/T Wave abnormalities: normal Narrative Interpretation: no evidence of acute ischemia  ____________________________________________  RADIOLOGY  Chest x-ray reviewed by me shows ET tube in appropriate position ____________________________________________   PROCEDURES  Procedure(s) performed: no  .Critical Care Performed by: Darel Hong, MD Authorized by: Darel Hong, MD   Critical care provider statement:    Critical care time (minutes):  40   Critical care time was exclusive of:  Separately billable procedures and treating other patients   Critical care was necessary to treat or prevent imminent or life-threatening deterioration of the following conditions:  Sepsis   Critical care was time spent personally by me on the following activities:  Development of treatment plan with patient or surrogate, discussions with consultants, evaluation of patient's response to treatment, examination of patient, obtaining history from patient or surrogate, ordering and performing treatments and interventions, ordering and review of laboratory studies, ordering and review of radiographic studies, pulse oximetry, re-evaluation of patient's condition and review of old charts Procedure Name: Intubation Date/Time: 03/12/2018 1:52 PM Performed by: Darel Hong, MD Pre-anesthesia Checklist: Patient identified, Patient being monitored, Emergency Drugs available, Timeout performed and Suction available Oxygen Delivery Method: Nasal cannula Preoxygenation: Pre-oxygenation with 100% oxygen Induction Type: Rapid sequence Ventilation: Mask ventilation without difficulty Laryngoscope Size: Mac and 4 Grade View: Grade I Tube size: 7.5 mm Number of attempts: 1 Placement Confirmation: ETT inserted through vocal cords under direct vision,  CO2 detector and Breath sounds checked- equal and bilateral Secured at: 23 cm Tube secured with: ETT holder     Angiocath  insertion Performed by: Darel Hong  Consent: Verbal consent obtained. Risks and benefits: risks, benefits and alternatives were discussed Time out: Immediately prior to procedure a "time out" was called to verify the correct patient, procedure, equipment, support staff and site/side marked as required.  Preparation: Patient was prepped and draped in the usual sterile fashion.  Vein Location: right EJ   Gauge: 20  Normal blood return  and flush without difficulty Patient tolerance: Patient tolerated the procedure well with no immediate complications.     Critical Care performed: Yes  Observation: no ____________________________________________   INITIAL IMPRESSION / ASSESSMENT AND PLAN / ED COURSE  Pertinent labs & imaging results that were available during my care of the patient were reviewed by me and considered in my medical decision making (see chart for details).  The patient arrived critically ill-appearing with a respiratory rate of 50 hypoxic to the low 80s on room air tachycardic to 130s and profoundly obtunded.  Temperature on scene was 102 degrees.  Decision was made to intubate for predicted clinical course given her altered mental status and severe sepsis.  Treating her with broad-spectrum antibiotics for sepsis of unknown etiology.  Fortunately at this point she is not shocky.  2 L of normal saline now and she will require inpatient admission.  I have discussed with the hospitalist Dr. Bobbye Charleston who has graciously agreed to admit the patient to his service.      ____________________________________________   FINAL CLINICAL IMPRESSION(S) / ED DIAGNOSES  Final diagnoses:  Sepsis, due to unspecified organism [facility])  Respiratory distress  Acute respiratory distress      NEW MEDICATIONS STARTED DURING THIS VISIT:  Current Discharge Medication List       Note:  This document was prepared using Dragon voice recognition software and may include  unintentional dictation errors.     Darel Hong, MD 03/11/18 1445

## 2018-03-10 NOTE — ED Triage Notes (Signed)
Pt arrived via [facility] from home with c/o SOB and temp of 102.9. EMS states pt was not responding appropriately to commands and had respirations in 40s. EMS states pt was diagnosed with UTI a couple of days ago.

## 2018-03-10 NOTE — Consult Note (Signed)
Reason for Consult:respiratory failure/sepsis Referring Physician: Dr. [name] is an 84 y.o. female.  HPI: Vanessa [name] is an 84 year old female with past medical history remarkable for colon cancer, diabetes, hypothyroidism, prior history of CVA, presents to the emergency department via EMS, complaining of shortness of breath, temperature 102.9, reading quickly. Patient was recently diagnosed with urinary tract infection. In emergency department her respiratory status deteriorated and she required intubation. Presently she is sedated on mechanical ventilation with stable hemodynamics. No family is available at this time  Past Medical History:  Diagnosis Date  . Colon cancer ([place])   . Diabetes mellitus without complication ([place])   . Stroke ([place])   . Thyroid disorder     Past Surgical History:  Procedure Laterality Date  . BACK SURGERY    . BREAST SURGERY    . COLON SURGERY    . TONSILLECTOMY      Family History  Problem Relation Age of Onset  . Breast cancer Mother   . Heart attack Father   . Diabetes Brother     Social History:  reports that she has never smoked. She has never used smokeless tobacco. She reports that she does not drink alcohol or use drugs.  Allergies:  Allergies  Allergen Reactions  . Benadryl [Diphenhydramine Hcl] Other (See Comments)    Pt states that it makes her legs jump.   . Codeine Nausea And Vomiting  . Oxycodone Nausea And Vomiting and Other (See Comments)    "Makes me real irritable and I feel terrible."  . Penicillins Other (See Comments)    Pt states that it does not work for her.   Has patient had a PCN reaction causing immediate rash, facial/tongue/throat swelling, SOB or lightheadedness with hypotension: No Has patient had a PCN reaction causing severe rash involving mucus membranes or skin necrosis: No Has patient had a PCN reaction that required hospitalization: No Has patient had a PCN reaction occurring within the last  10 years: No If all of the above answers are "NO", then may proceed with Cephalosporin use.     Medications: I have reviewed the patient's current medications.  Results for orders placed or performed during the hospital encounter of 03/29/2018 (from the past 48 hour(s))  Lactic acid, plasma     Status: Abnormal   Collection Time: 03/31/2018  1:34 PM  Result Value Ref Range   Lactic Acid, Venous 2.0 (HH) 0.5 - 1.9 mmol/L    Comment: CRITICAL RESULT CALLED TO, READ BACK BY AND VERIFIED WITH COLE AMORIELLO ON 03/16/2018 AT [address] [facility] Performed at [facility], [address] [place], [place] [address]   Comprehensive metabolic panel     Status: Abnormal   Collection Time: 03/13/2018  1:34 PM  Result Value Ref Range   Sodium 135 135 - 145 mmol/L   Potassium 4.0 3.5 - 5.1 mmol/L   Chloride 104 101 - 111 mmol/L   CO2 22 22 - 32 mmol/L   Glucose, Bld 196 (H) 65 - 99 mg/dL   BUN 38 (H) 6 - 20 mg/dL   Creatinine, Ser 1.35 (H) 0.44 - 1.00 mg/dL   Calcium 8.3 (L) 8.9 - 10.3 mg/dL   Total Protein 6.4 (L) 6.5 - 8.1 g/dL   Albumin 3.1 (L) 3.5 - 5.0 g/dL   AST 57 (H) 15 - 41 U/L   ALT 14 14 - 54 U/L   Alkaline Phosphatase 118 38 - 126 U/L   Total Bilirubin 2.4 (H) 0.3 -  1.2 mg/dL   GFR calc non Af Amer 35 (L) >60 mL/min   GFR calc Af Amer 40 (L) >60 mL/min    Comment: (NOTE) The eGFR has been calculated using the CKD EPI equation. This calculation has not been validated in all clinical situations. eGFR's persistently <60 mL/min signify possible Chronic Kidney Disease.    Anion gap 9 5 - 15    Comment: Performed at [facility], [address] [place], [place] [address]  Lipase, blood     Status: None   Collection Time: 03/06/2018  1:34 PM  Result Value Ref Range   Lipase 28 11 - 51 U/L    Comment: Performed at [facility], [address] [place], [place] [address]  Troponin I     Status: None   Collection Time: 03/29/2018  1:34 PM  Result Value Ref Range    Troponin I <0.03 <0.03 ng/mL    Comment: Performed at [facility], [address] [place], [place] [address]  CBC WITH DIFFERENTIAL     Status: Abnormal   Collection Time: 03/18/2018  1:34 PM  Result Value Ref Range   WBC 34.2 (H) 3.6 - 11.0 K/uL   RBC 3.13 (L) 3.80 - 5.20 MIL/uL   Hemoglobin 10.4 (L) 12.0 - 16.0 g/dL   HCT 29.0 (L) 35.0 - 47.0 %   MCV 92.8 80.0 - 100.0 fL   MCH 33.1 26.0 - 34.0 pg   MCHC 35.7 32.0 - 36.0 g/dL   RDW 15.1 (H) 11.5 - 14.5 %   Platelets 243 150 - 440 K/uL   Neutrophils Relative % 91 %   Neutro Abs 31.2 (H) 1.4 - 6.5 K/uL   Lymphocytes Relative 3 %   Lymphs Abs 1.0 1.0 - 3.6 K/uL   Monocytes Relative 6 %   Monocytes Absolute 2.0 (H) 0.2 - 0.9 K/uL   Eosinophils Relative 0 %   Eosinophils Absolute 0.0 0 - 0.7 K/uL   Basophils Relative 0 %   Basophils Absolute 0.0 0 - 0.1 K/uL    Comment: Performed at [facility], [address] [place], [place] [address]  Procalcitonin     Status: None   Collection Time: 03/09/2018  1:34 PM  Result Value Ref Range   Procalcitonin 2.95 ng/mL    Comment:        Interpretation: PCT > 2 ng/mL: Systemic infection (sepsis) is likely, unless other causes are known. (NOTE)       Sepsis PCT Algorithm           Lower Respiratory Tract                                      Infection PCT Algorithm    ----------------------------     ----------------------------         PCT < 0.25 ng/mL                PCT < 0.10 ng/mL         Strongly encourage             Strongly discourage   discontinuation of antibiotics    initiation of antibiotics    ----------------------------     -----------------------------       PCT 0.25 - 0.50 ng/mL            PCT 0.10 - 0.25 ng/mL  OR       >80% decrease in PCT            Discourage initiation of                                            antibiotics      Encourage discontinuation           of antibiotics    ----------------------------      -----------------------------         PCT >= 0.50 ng/mL              PCT 0.26 - 0.50 ng/mL               AND       <80% decrease in PCT              Encourage initiation of                                             antibiotics       Encourage continuation           of antibiotics    ----------------------------     -----------------------------        PCT >= 0.50 ng/mL                  PCT > 0.50 ng/mL               AND         increase in PCT                  Strongly encourage                                      initiation of antibiotics    Strongly encourage escalation           of antibiotics                                     -----------------------------                                           PCT <= 0.25 ng/mL                                                 OR                                        > 80% decrease in PCT                                     Discontinue / Do not initiate  antibiotics Performed at [facility], [address] [place], [place] [address]   Protime-INR     Status: Abnormal   Collection Time: 03/19/2018  1:34 PM  Result Value Ref Range   Prothrombin Time 16.3 (H) 11.4 - 15.2 seconds   INR 1.32     Comment: Performed at [facility], [address] [place], [place] [address]  Blood gas, venous     Status: Abnormal (Preliminary result)   Collection Time: 03/27/2018  1:35 PM  Result Value Ref Range   FIO2 0.21    pH, Ven 7.43 7.250 - 7.430   pCO2, Ven 36 (L) 44.0 - 60.0 mmHg   pO2, Ven PENDING 32.0 - 45.0 mmHg   Bicarbonate 23.9 20.0 - 28.0 mmol/L   Acid-Base Excess 0.1 0.0 - 2.0 mmol/L   O2 Saturation PENDING %   Patient temperature 37.0    Collection site VEIN    Sample type VEIN     Comment: Performed at [facility], [address] [place], [place] [address]    Dg Abdomen 1 View  Result Date: 03/21/2018 CLINICAL DATA:  Orogastric tube placement. EXAM: ABDOMEN  - 1 VIEW COMPARISON:  None. FINDINGS: Orogastric tube tip is seen overlying the mid to distal portion of the stomach. IMPRESSION: Orogastric tube tip overlies the mid to distal stomach. Electronically Signed   By: Earle Gell M.D.   On: 03/22/2018 14:35   Dg Chest Port 1 View  Result Date: 03/02/2018 CLINICAL DATA:  Acute respiratory failure with hypoxia. Sepsis. Endotracheal tube placement. EXAM: PORTABLE CHEST 1 VIEW COMPARISON:  03/04/2018 FINDINGS: Endotracheal tube is seen in place with tip approximately 3 cm above the carina. Orogastric tube is seen entering the stomach. Pulmonary hyperinflation again seen, consistent with COPD. Both lungs are clear. Heart size is normal. Aortic atherosclerosis. Several old left rib fracture deformities are again seen. IMPRESSION: Endotracheal tube and orogastric tube in appropriate position. COPD.  No active lung disease. Electronically Signed   By: Earle Gell M.D.   On: 03/13/2018 14:36    ROS Blood pressure (!) 106/52, pulse (!) 112, temperature (!) 101.5 F (38.6 C), resp. rate 20, height '5\' 4"'$  (1.626 m), weight 158 lb (71.7 kg), SpO2 100 %. Physical Exam Vital signs: Please see the above listed vital signs Patient is an elderly-appearing female presently intubated on mechanical ventilation HEENT: Oral intubation, trachea midline, no thyromegaly appreciated, no jugular venous distention Cardiovascular: Regular rhythm, tachycardia, sinus mechanism appreciated on the monitor Pulmonary: Clear to auscultation Abdominal: Positive bowel sounds, soft exam Extremities: No clubbing cyanosis or edema noted Neurologic: Limited exam, patient sedated on mechanical ventilation  Assessment/Plan:  Sepsis. Apparently patient had foul-smelling urine when diaper changed,initial UA however is negative. Patient is febrile, does have a white count of 34.2. Agree with empiric treatment for sepsis, lactic acid is elevated at 2 will trend, fluid resuscitation,patient given  Maxipime, vancomycin  Respiratory failure. Chest x-ray appears relatively clear, most likely secondary to metabolic derangement. Will place on mechanical ventilation protocol  Renal insufficiency. Predominantly prerenal with a BUN of 38 over creatinine 1.35, will volume resuscitate, follow-up and if does not improve will obtain renal ultrasound. Support hemodynamics as needed  History of seizure disorder on Keppra  History of hypothyroidism on Synthroid  Anemia no evidence of active bleeding     Vanessa [name] 03/20/2018, 2:44 PM

## 2018-03-10 NOTE — H&P (Addendum)
[place] [facility] [place] NAME: Vanessa [name]    MR#:  [id number]  DATE OF BIRTH:  1931/02/25  DATE OF ADMISSION:  03/22/2018  PRIMARY CARE PHYSICIAN: Herminio Commons, MD   REQUESTING/REFERRING PHYSICIAN: Dr Darel Hong  CHIEF COMPLAINT:   Chief Complaint  Patient presents with  . Blood Infection    HISTORY OF PRESENT ILLNESS:  Vanessa [name]  is a 84 y.o. female with a recent emergency room visit on 03/04/2018 and treated for a urinary tract infection with Keflex.  The patient was brought in via EMS and found to have a temperature of 102.9 respirations in the 40s and obtunded.  The patient was intubated by the ER physician.  The patient is unable to provide history at this time.  I tried to call both daughters on the phone numbers in the chart and unable to reach them about further history.  Patient is moving her right arm on her own.  She did have an MRI of the brain that was done on 03/04/2018 that showed stable noncontrast MRI which showed chronic lacunar type infarct in the right corona radiata and chronic left occipital lobe meningioma with mild regional edema.  Hospitalist services were contacted for admission for intubated patient.  PAST MEDICAL HISTORY:   Past Medical History:  Diagnosis Date  . Colon cancer ([place])   . Diabetes mellitus without complication ([place])   . Stroke ([place])   . Thyroid disorder     PAST SURGICAL HISTORY:   Past Surgical History:  Procedure Laterality Date  . BACK SURGERY    . BREAST SURGERY    . COLON SURGERY    . TONSILLECTOMY      SOCIAL HISTORY:   Social History   Tobacco Use  . Smoking status: Never Smoker  . Smokeless tobacco: Never Used  Substance Use Topics  . Alcohol use: No    FAMILY HISTORY:   Family History  Problem Relation Age of Onset  . Breast cancer Mother   . Heart attack Father   . Diabetes Brother     DRUG ALLERGIES:   Allergies  Allergen Reactions  . Benadryl  [Diphenhydramine Hcl] Other (See Comments)    Pt states that it makes her legs jump.   . Codeine Nausea And Vomiting  . Oxycodone Nausea And Vomiting and Other (See Comments)    "Makes me real irritable and I feel terrible."  . Penicillins Other (See Comments)    Pt states that it does not work for her.   Has patient had a PCN reaction causing immediate rash, facial/tongue/throat swelling, SOB or lightheadedness with hypotension: No Has patient had a PCN reaction causing severe rash involving mucus membranes or skin necrosis: No Has patient had a PCN reaction that required hospitalization: No Has patient had a PCN reaction occurring within the last 10 years: No If all of the above answers are "NO", then may proceed with Cephalosporin use.     REVIEW OF SYSTEMS:  Unable to provide review of systems secondary to being intubated.  MEDICATIONS AT HOME:   Prior to Admission medications   Medication Sig Start Date End Date Taking? Authorizing Provider  acetaminophen (TYLENOL) 500 MG tablet Take 500 mg every 8 (eight) hours as needed by mouth for headache.   Yes [provider]  amLODipine (NORVASC) 5 MG tablet Take 5 mg by mouth every morning.    Yes [provider]  aspirin 325 MG tablet Take 1 tablet (  325 mg total) by mouth daily. Patient taking differently: Take 81 mg by mouth daily.  06/25/16  Yes Dustin Flock, MD  atorvastatin (LIPITOR) 40 MG tablet Take 1 tablet (40 mg total) by mouth daily. 06/22/17 06/22/18 Yes Dustin Flock, MD  cephALEXin (KEFLEX) 500 MG capsule Take 1 capsule (500 mg total) by mouth 3 (three) times daily for 10 days. 03/04/18 03/14/18 Yes Nena Polio, MD  citalopram (CELEXA) 20 MG tablet Take 20 mg by mouth daily.   Yes [provider]  levETIRAcetam (KEPPRA) 500 MG tablet Take 1 tablet (500 mg total) by mouth 2 (two) times daily. 06/24/16  Yes Dustin Flock, MD  levothyroxine (SYNTHROID, LEVOTHROID) 75 MCG tablet Take 75 mcg by  mouth daily before breakfast.   Yes [provider]  senna-docusate (SENOKOT-S) 8.6-50 MG tablet Take 1 tablet at bedtime as needed by mouth for mild constipation. 08/16/17  Yes Mody, Ulice Bold, MD      VITAL SIGNS:  Blood pressure (!) 106/52, pulse (!) 112, temperature (!) 101.5 F (38.6 C), resp. rate 20, height 5\' 4"  (1.626 m), weight 71.7 kg (158 lb), SpO2 100 %.  PHYSICAL EXAMINATION:  GENERAL:  84 y.o.-year-old patient lying in the bed with acute distress.  EYES: Pupils equal, round, reactive to light and accommodation. No scleral icterus. HEENT: Head atraumatic, normocephalic. Nasopharynx clear.  NECK:  Supple, no jugular venous distention. No thyroid enlargement, no tenderness.  LUNGS: Decreased breath sounds bilaterally, no wheezing, rales,rhonchi or crepitation. No use of accessory muscles of respiration.  CARDIOVASCULAR: S1, S2  tachycardic. No murmurs, rubs, or gallops.  ABDOMEN: Soft, nontender, nondistended. Bowel sounds present. No organomegaly or mass.  EXTREMITIES:  trace pedal edema, no cyanosis, or clubbing.  NEUROLOGIC:  patient able to move her right arm on her own. PSYCHIATRIC: The patient is sedated.  SKIN: No rash, lesion, or ulcer.   LABORATORY PANEL:   CBC Recent Labs  Lab 03/18/2018 1334  WBC 34.2*  HGB 10.4*  HCT 29.0*  PLT 243   ------------------------------------------------------------------------------------------------------------------  Chemistries  Recent Labs  Lab 03/27/2018 1334  NA 135  K 4.0  CL 104  CO2 22  GLUCOSE 196*  BUN 38*  CREATININE 1.35*  CALCIUM 8.3*  AST 57*  ALT 14  ALKPHOS 118  BILITOT 2.4*   ------------------------------------------------------------------------------------------------------------------  Cardiac Enzymes Recent Labs  Lab 03/07/2018 1334  TROPONINI <0.03   ------------------------------------------------------------------------------------------------------------------  RADIOLOGY:  Dg  Abdomen 1 View  Result Date: 03/03/2018 CLINICAL DATA:  Orogastric tube placement. EXAM: ABDOMEN - 1 VIEW COMPARISON:  None. FINDINGS: Orogastric tube tip is seen overlying the mid to distal portion of the stomach. IMPRESSION: Orogastric tube tip overlies the mid to distal stomach. Electronically Signed   By: Earle Gell M.D.   On: 03/16/2018 14:35   Dg Chest Port 1 View  Result Date: 03/26/2018 CLINICAL DATA:  Acute respiratory failure with hypoxia. Sepsis. Endotracheal tube placement. EXAM: PORTABLE CHEST 1 VIEW COMPARISON:  03/04/2018 FINDINGS: Endotracheal tube is seen in place with tip approximately 3 cm above the carina. Orogastric tube is seen entering the stomach. Pulmonary hyperinflation again seen, consistent with COPD. Both lungs are clear. Heart size is normal. Aortic atherosclerosis. Several old left rib fracture deformities are again seen. IMPRESSION: Endotracheal tube and orogastric tube in appropriate position. COPD.  No active lung disease. Electronically Signed   By: Earle Gell M.D.   On: 03/23/2018 14:36    EKG:   Sinus tachycardia 115 bpm, right bundle branch block, left anterior  fascicular block  IMPRESSION AND PLAN:   1.  Clinical sepsis, acute encephalopathy and relative hypotension.  Lactic acidosis.  Patient had a recent diagnosis of urinary tract infection.  Fever, leukocytosis and tachycardia.  Sepsis protocol and aggressive antibiotics ordered by ER physician.  If any diarrhea, can send for C. difficile. 2.  History of stroke we will switch aspirin to rectal for right now 3.  History of seizure and meningioma on Keppra.  Will switch to IV for now. 4.  Hypothyroidism unspecified on levothyroxine.  Check a TSH in the a.m. 5.  Type 2 diabetes mellitus.  We will put on sliding scale.   All the records are reviewed and case discussed with ED provider.  CODE STATUS: Patient listed as full code on last hospitalization.  Unable to contact family about her wishes.  TOTAL  TIME TAKING CARE OF THIS PATIENT: 50 minutes, patient is critically ill and will be admitted to the ICU.  Case discussed with critical care specialist Dr. Jefferson Fuel.   Loletha Grayer M.D on 03/14/2018 at 2:40 PM  Between 7am to 6pm - Pager - [phone]  After 6pm call admission pager [phone]  [facility]  [phone]  CC: Primary care physician; Herminio Commons, MD

## 2018-03-10 NOTE — Progress Notes (Signed)
CODE SEPSIS - PHARMACY COMMUNICATION  **Broad Spectrum Antibiotics should be administered within 1 hour of Sepsis diagnosis**  Time Code Sepsis Called/Page Received: 1333  Antibiotics Ordered: Cefepime and Vanco   Time of 1st antibiotic administration: 1418  Additional action taken by pharmacy:   If necessary, Name of Provider/Nurse Contacted:     Candelaria Stagers ,PharmD Pharmacy Resident  03/31/2018  1:45 PM

## 2018-03-11 ENCOUNTER — Inpatient Hospital Stay: Payer: Medicare Other

## 2018-03-11 ENCOUNTER — Inpatient Hospital Stay: Payer: Self-pay

## 2018-03-11 DIAGNOSIS — J9601 Acute respiratory failure with hypoxia: Secondary | ICD-10-CM

## 2018-03-11 DIAGNOSIS — R6520 Severe sepsis without septic shock: Secondary | ICD-10-CM

## 2018-03-11 DIAGNOSIS — I959 Hypotension, unspecified: Secondary | ICD-10-CM

## 2018-03-11 LAB — BLOOD CULTURE ID PANEL (REFLEXED)
Acinetobacter baumannii: NOT DETECTED
CANDIDA TROPICALIS: NOT DETECTED
Candida albicans: NOT DETECTED
Candida glabrata: NOT DETECTED
Candida krusei: NOT DETECTED
Candida parapsilosis: NOT DETECTED
ENTEROBACTERIACEAE SPECIES: NOT DETECTED
Enterobacter cloacae complex: NOT DETECTED
Enterococcus species: NOT DETECTED
Escherichia coli: NOT DETECTED
HAEMOPHILUS INFLUENZAE: NOT DETECTED
KLEBSIELLA PNEUMONIAE: NOT DETECTED
Klebsiella oxytoca: NOT DETECTED
Listeria monocytogenes: NOT DETECTED
METHICILLIN RESISTANCE: NOT DETECTED
NEISSERIA MENINGITIDIS: NOT DETECTED
PROTEUS SPECIES: NOT DETECTED
Pseudomonas aeruginosa: NOT DETECTED
STREPTOCOCCUS SPECIES: NOT DETECTED
Serratia marcescens: NOT DETECTED
Staphylococcus aureus (BCID): NOT DETECTED
Staphylococcus species: DETECTED — AB
Streptococcus agalactiae: NOT DETECTED
Streptococcus pneumoniae: NOT DETECTED
Streptococcus pyogenes: NOT DETECTED

## 2018-03-11 LAB — BASIC METABOLIC PANEL
Anion gap: 9 (ref 5–15)
BUN: 47 mg/dL — AB (ref 6–20)
CO2: 21 mmol/L — ABNORMAL LOW (ref 22–32)
Calcium: 8.3 mg/dL — ABNORMAL LOW (ref 8.9–10.3)
Chloride: 103 mmol/L (ref 101–111)
Creatinine, Ser: 1.54 mg/dL — ABNORMAL HIGH (ref 0.44–1.00)
GFR calc non Af Amer: 30 mL/min — ABNORMAL LOW (ref >60)
GFR, EST AFRICAN AMERICAN: 34 mL/min — AB (ref >60)
Glucose, Bld: 322 mg/dL — ABNORMAL HIGH (ref 65–99)
POTASSIUM: 4 mmol/L (ref 3.5–5.1)
SODIUM: 133 mmol/L — AB (ref 135–145)

## 2018-03-11 LAB — CBC
HEMATOCRIT: 26.9 % — AB (ref 35.0–47.0)
Hemoglobin: 9.8 g/dL — ABNORMAL LOW (ref 12.0–16.0)
MCH: 33.9 pg (ref 26.0–34.0)
MCHC: 36.7 g/dL — ABNORMAL HIGH (ref 32.0–36.0)
MCV: 92.5 fL (ref 80.0–100.0)
Platelets: 239 10*3/uL (ref 150–440)
RBC: 2.9 MIL/uL — AB (ref 3.80–5.20)
RDW: 15 % — AB (ref 11.5–14.5)
WBC: 33.9 10*3/uL — AB (ref 3.6–11.0)

## 2018-03-11 LAB — URINE CULTURE: Culture: NO GROWTH

## 2018-03-11 LAB — GLUCOSE, CAPILLARY: GLUCOSE-CAPILLARY: 259 mg/dL — AB (ref 65–99)

## 2018-03-11 LAB — MAGNESIUM: Magnesium: 2.3 mg/dL (ref 1.7–2.4)

## 2018-03-11 LAB — PHOSPHORUS: PHOSPHORUS: 3.5 mg/dL (ref 2.5–4.6)

## 2018-03-11 LAB — PROCALCITONIN: Procalcitonin: 3.66 ng/mL

## 2018-03-11 MED ORDER — NOREPINEPHRINE 16 MG/250ML-% IV SOLN: 0.0000 ug/min | INTRAVENOUS | Status: DC

## 2018-03-11 MED ORDER — SODIUM CHLORIDE 0.9 % IV BOLUS
500.0000 mL | Freq: Once | INTRAVENOUS | Status: AC
  Administered 2018-03-11: 500 mL via INTRAVENOUS

## 2018-03-11 MED ORDER — SODIUM CHLORIDE 0.9 % IV SOLN
1.0000 g | Freq: Two times a day (BID) | INTRAVENOUS | Status: DC
  Administered 2018-03-11 – 2018-03-13 (×6): 1 g via INTRAVENOUS
  Filled 2018-03-11 (×8): qty 1

## 2018-03-11 MED ORDER — SODIUM CHLORIDE 0.9% FLUSH: 10.0000 mL | INTRAVENOUS | Status: DC | PRN

## 2018-03-11 MED ORDER — NOREPINEPHRINE BITARTRATE 1 MG/ML IV SOLN
0.0000 ug/min | INTRAVENOUS | Status: DC
  Administered 2018-03-11: 2 ug/min via INTRAVENOUS
  Filled 2018-03-11: qty 16

## 2018-03-11 MED ORDER — ENOXAPARIN SODIUM 30 MG/0.3ML ~~LOC~~ SOLN
30.0000 mg | SUBCUTANEOUS | Status: DC
  Administered 2018-03-11 – 2018-03-13 (×3): 30 mg via SUBCUTANEOUS
  Filled 2018-03-11 (×3): qty 0.3

## 2018-03-11 MED ORDER — LACTATED RINGERS IV SOLN
INTRAVENOUS | Status: DC
  Administered 2018-03-11 – 2018-03-12 (×3): via INTRAVENOUS

## 2018-03-11 MED ORDER — LEVOTHYROXINE SODIUM 100 MCG IV SOLR
37.5000 ug | Freq: Every day | INTRAVENOUS | Status: DC
  Administered 2018-03-12 – 2018-03-13 (×2): 37.5 ug via INTRAVENOUS
  Filled 2018-03-11 (×3): qty 5

## 2018-03-11 MED ORDER — ORAL CARE MOUTH RINSE
15.0000 mL | Freq: Two times a day (BID) | OROMUCOSAL | Status: DC
  Administered 2018-03-11 – 2018-03-12 (×3): 15 mL via OROMUCOSAL

## 2018-03-11 MED ORDER — NOREPINEPHRINE 4 MG/250ML-% IV SOLN
0.0000 ug/min | INTRAVENOUS | Status: DC
Start: 2018-03-11 — End: 2018-03-11
  Administered 2018-03-11: 2 ug/min via INTRAVENOUS
  Filled 2018-03-11 (×2): qty 250

## 2018-03-11 MED ORDER — SODIUM CHLORIDE 0.9% FLUSH
10.0000 mL | Freq: Two times a day (BID) | INTRAVENOUS | Status: DC
  Administered 2018-03-11: 30 mL
  Administered 2018-03-12 (×2): 10 mL
  Administered 2018-03-13: 20 mL
  Administered 2018-03-13 – 2018-03-14 (×2): 10 mL
  Administered 2018-03-14: 22:00:00 40 mL

## 2018-03-11 NOTE — Progress Notes (Signed)
During rounds spoke with Dr. Leonidas Romberg regarding patients blood pressure, urine output 25 ml since 7:00am, BUN 47/ creatinine 1.54 blood sugar 259, no blood return EJ or peripheral, OG placement- KUB performed og at 54 cm. Per Dr Leonidas Romberg advance 5 cm and orders for fluid bolus. At this point no nephrology/pallative consult.

## 2018-03-11 NOTE — Progress Notes (Signed)
PHARMACY - PHYSICIAN COMMUNICATION CRITICAL VALUE ALERT - BLOOD CULTURE IDENTIFICATION (BCID)  Vanessa [name] is an 84 y.o. female who presented to [facility] on 03/22/2018 with a chief complaint of Sepsis  Name of physician (or Provider) Contacted: Dr. Alva Garnet  Current antibiotics: Cefepime and Vancomycin   Changes to prescribed antibiotics recommended: MD to access patient and review profile and make a decision.    Results for orders placed or performed during the hospital encounter of 03/20/2018  Blood Culture ID Panel (Reflexed) (Collected: 03/19/2018  1:35 PM)  Result Value Ref Range   Enterococcus species NOT DETECTED NOT DETECTED   Listeria monocytogenes NOT DETECTED NOT DETECTED   Staphylococcus species DETECTED (A) NOT DETECTED   Staphylococcus aureus NOT DETECTED NOT DETECTED   Methicillin resistance NOT DETECTED NOT DETECTED   Streptococcus species NOT DETECTED NOT DETECTED   Streptococcus agalactiae NOT DETECTED NOT DETECTED   Streptococcus pneumoniae NOT DETECTED NOT DETECTED   Streptococcus pyogenes NOT DETECTED NOT DETECTED   Acinetobacter baumannii NOT DETECTED NOT DETECTED   Enterobacteriaceae species NOT DETECTED NOT DETECTED   Enterobacter cloacae complex NOT DETECTED NOT DETECTED   Escherichia coli NOT DETECTED NOT DETECTED   Klebsiella oxytoca NOT DETECTED NOT DETECTED   Klebsiella pneumoniae NOT DETECTED NOT DETECTED   Proteus species NOT DETECTED NOT DETECTED   Serratia marcescens NOT DETECTED NOT DETECTED   Haemophilus influenzae NOT DETECTED NOT DETECTED   Neisseria meningitidis NOT DETECTED NOT DETECTED   Pseudomonas aeruginosa NOT DETECTED NOT DETECTED   Candida albicans NOT DETECTED NOT DETECTED   Candida glabrata NOT DETECTED NOT DETECTED   Candida krusei NOT DETECTED NOT DETECTED   Candida parapsilosis NOT DETECTED NOT DETECTED   Candida tropicalis NOT DETECTED NOT DETECTED    Pernell Dupre, PharmD, BCPS Clinical Pharmacist 03/11/2018 12:13  PM

## 2018-03-11 NOTE — Progress Notes (Signed)
Placed on high folwers position, cuff deflated, suctioned oarlly and endotracheally and then extubated to 2 lpm o2 [place]

## 2018-03-11 NOTE — Progress Notes (Addendum)
Spoke with Dr Leonidas Romberg regarding patients total urine output since 7:00 am being 65 ml. At this point no additional orders given. MD is aware.

## 2018-03-11 NOTE — Progress Notes (Signed)
BCID note:  Lab result information given to Clinical Pharmacist, Sheema Hallaji  Blood cx: 1 of 4 bottles (aerobic)= + Staph SPECIES, MecA negative.  Patient currently on Vancomycin and cefepime for sepsis.  Chinita Greenland PharmD Clinical Pharmacist 03/11/2018

## 2018-03-11 NOTE — Progress Notes (Signed)
Tried to call family with primary RN on the floor to get PICC placement consent. Unable to reach both daughter. Will follow up and RN will update consult if needed.

## 2018-03-11 NOTE — Progress Notes (Signed)
Per Dr Leonidas Romberg ok to transfuse levophed regular strength through peripheral. Advised MD that patient only has one IV access at this time. PICC team attempting to obtain consent for PICC line placement.

## 2018-03-11 NOTE — Progress Notes (Signed)
Pharmacy lovenox dose adjustment. Patient ordered lovenox 40 mg subq daily w/ CrCl 29.1 ml/min  Will decrease lovenox dose to 30 mg subq daily.  Tobie Lords, PharmD, BCPS Clinical Pharmacist 03/11/2018

## 2018-03-11 NOTE — Care Management (Signed)
Per RN patient recently treated with Keflex for UTI however presented to hospital. Patient now with decreased urine output. RT will attempt SBT. Patient had recent tibial fracture and used [facility] for home PT. RNCM will follow.

## 2018-03-11 NOTE — Progress Notes (Signed)
PT PROFILE: 40F PMH of colon cancer, diabetes, hypothyroidism, CVA, presented to the ED 6/09 via EMS with SOB, fever. H/O ESBL UTI. Urine was reportedly foul. In ED, her respiratory status deteriorated and she required intubation.    SUBJ Passed SBT and extubated this AM.  Tolerating that well.  BP has been borderline all day and responds partially to fluid boluses.  Urine output poor.  Cognition poor.  Poorly oriented.  Vitals:   03/11/18 1400 03/11/18 1500 03/11/18 1600 03/11/18 1700  BP: (!) 89/41 (!) 90/45 (!) 92/47 (!) 93/42  Pulse: 64 64 65 68  Resp: 18 16 19  (!) 21  Temp:   97.6 F (36.4 C)   TempSrc:   Oral   SpO2: 100% 100% 100% 100%  Weight:      Height:       [place] O2  Poorly oriented Tolerating extubation HEENT WNL No JVD noted No wheezes or other adventitious sounds anteriorly RRR, no M Abdomen mildly distended, NT, diminished BS Extremities warm, no edema Left hand contractures (history of CVA)  BMP Latest Ref Rng & Units 03/11/2018 03/06/2018 03/04/2018  Glucose 65 - 99 mg/dL 322(H) 196(H) 126(H)  BUN 6 - 20 mg/dL 47(H) 38(H) 21(H)  Creatinine 0.44 - 1.00 mg/dL 1.54(H) 1.35(H) 1.02(H)  Sodium 135 - 145 mmol/L 133(L) 135 133(L)  Potassium 3.5 - 5.1 mmol/L 4.0 4.0 3.9  Chloride 101 - 111 mmol/L 103 104 104  CO2 22 - 32 mmol/L 21(L) 22 20(L)  Calcium 8.9 - 10.3 mg/dL 8.3(L) 8.3(L) 8.5(L)   CBC Latest Ref Rng & Units 03/11/2018 03/29/2018 03/04/2018  WBC 3.6 - 11.0 K/uL 33.9(H) 34.2(H) 13.8(H)  Hemoglobin 12.0 - 16.0 g/dL 9.8(L) 10.4(L) 12.3  Hematocrit 35.0 - 47.0 % 26.9(L) 29.0(L) 34.1(L)  Platelets 150 - 440 K/uL 239 243 206   CXR: NNF  IMPRESSION: Acute respiratory failure, much improved VDRF, resolved Hypotension, suspect septic shock Oliguric AKI Acute encephalopathy History of ESBL UTI  PLAN/REC: Extubated under my direction this morning Supplemental oxygen to maintain SPO2 >90% Fluid resuscitation PICC ordered due to poor venous access and possible  need for vasopressors Very poor candidate for hemodialysis Antibiotics changed to meropenem Will need to meet with family regarding goals of care  Merton Border, MD PCCM service Mobile [phone] Pager [phone] 03/11/2018 5:10 PM

## 2018-03-11 NOTE — Progress Notes (Signed)
Pharmacy Antibiotic Note  Vanessa [name] is a 84 y.o. female admitted on 03/05/2018 with pneumonia.  Pharmacy has been consulted for Cefepime and vanco dosing.  6/10 Cefepime now discontinued. Pharmacy now consulted for meropenem dosing for PMH of ESBL infection.   Plan:  Vancomycin 1g IV q36h Ordered. Will order random vanc level 6/11 prior to 3rd dose to confirm patient is clearing Vanc.   Start Meropenem 1g IV every 12 hours backed on current CrCl <86ml.min.    ke 0.029, t1/2 23.90, Vd 38.3, IBW 54.7kg    Height: 5\' 4"  (162.6 cm) Weight: 152 lb 1.9 oz (69 kg) IBW/kg (Calculated) : 54.7  Temp (24hrs), Avg:99.3 F (37.4 C), Min:96.4 F (35.8 C), Max:101.5 F (38.6 C)  Recent Labs  Lab 03/04/18 1340 03/07/2018 1334 03/16/2018 1645 03/11/18 0316  WBC  --  34.2*  --  33.9*  CREATININE 1.02* 1.35*  --  1.54*  LATICACIDVEN  --  2.0* 1.8  --     Estimated Creatinine Clearance: 25.5 mL/min (A) (by C-G formula based on SCr of 1.54 mg/dL (H)).    Allergies  Allergen Reactions  . Benadryl [Diphenhydramine Hcl] Other (See Comments)    Pt states that it makes her legs jump.   . Codeine Nausea And Vomiting  . Oxycodone Nausea And Vomiting and Other (See Comments)    "Makes me real irritable and I feel terrible."  . Penicillins Other (See Comments)    Pt states that it does not work for her.   Has patient had a PCN reaction causing immediate rash, facial/tongue/throat swelling, SOB or lightheadedness with hypotension: No Has patient had a PCN reaction causing severe rash involving mucus membranes or skin necrosis: No Has patient had a PCN reaction that required hospitalization: No Has patient had a PCN reaction occurring within the last 10 years: No If all of the above answers are "NO", then may proceed with Cephalosporin use.     Antimicrobials this admission: 6/9 Vanco >>  6/9 Cefepime >>  6/10 6/10 meropenem>>   Dose adjustments this admission:   Microbiology  results: 6/9 BCx: Sent 6/9  BCID Staph species  6/9 UCx: Sent 6/9 MRSA PCR: negative   Thank you for allowing pharmacy to be a part of this patient's care.  Pernell Dupre, PharmD, BCPS Clinical Pharmacist 03/11/2018 12:18 PM

## 2018-03-11 NOTE — Progress Notes (Signed)
Dr Leonidas Romberg will be in to evaluate patient. Patients hypotensive, decreased urine output, IV access needs to be addressed, and blood sugars.

## 2018-03-11 NOTE — Progress Notes (Signed)
[facility] [place] NAME: Shakena Callari    MR#:  [id number]  DATE OF BIRTH:  [birth year]  SUBJECTIVE:    REVIEW OF SYSTEMS:   ROS Tolerating Diet: Tolerating PT:   DRUG ALLERGIES:   Allergies  Allergen Reactions  . Benadryl [Diphenhydramine Hcl] Other (See Comments)    Pt states that it makes her legs jump.   . Codeine Nausea And Vomiting  . Oxycodone Nausea And Vomiting and Other (See Comments)    "Makes me real irritable and I feel terrible."  . Penicillins Other (See Comments)    Pt states that it does not work for her.   Has patient had a PCN reaction causing immediate rash, facial/tongue/throat swelling, SOB or lightheadedness with hypotension: No Has patient had a PCN reaction causing severe rash involving mucus membranes or skin necrosis: No Has patient had a PCN reaction that required hospitalization: No Has patient had a PCN reaction occurring within the last 10 years: No If all of the above answers are "NO", then may proceed with Cephalosporin use.     VITALS:  Blood pressure (!) 85/45, pulse 60, temperature (!) 97.4 F (36.3 C), resp. rate 19, height 5\' 4"  (1.626 m), weight 69 kg (152 lb 1.9 oz), SpO2 98 %.  PHYSICAL EXAMINATION:   Physical Exam  GENERAL:  84 y.o.-year-old patient lying in the bed with no acute distress.  EYES: Pupils equal, round, reactive to light and accommodation. No scleral icterus. Extraocular muscles intact.  HEENT: Head atraumatic, normocephalic. Oropharynx and nasopharynx clear.  NECK:  Supple, no jugular venous distention. No thyroid enlargement, no tenderness.  LUNGS: Normal breath sounds bilaterally, no wheezing, rales, rhonchi. No use of accessory muscles of respiration.  CARDIOVASCULAR: S1, S2 normal. No murmurs, rubs, or gallops.  ABDOMEN: Soft, nontender, nondistended. Bowel sounds present. No organomegaly or mass.  EXTREMITIES: No cyanosis, clubbing or edema b/l.     NEUROLOGIC: Cranial nerves II through XII are intact. No focal Motor or sensory deficits b/l.   PSYCHIATRIC:  patient is alert and oriented x 3.  SKIN: No obvious rash, lesion, or ulcer.   LABORATORY PANEL:  CBC Recent Labs  Lab 03/11/18 0316  WBC 33.9*  HGB 9.8*  HCT 26.9*  PLT 239    Chemistries  Recent Labs  Lab 03/08/2018 1334 03/11/18 0316  NA 135 133*  K 4.0 4.0  CL 104 103  CO2 22 21*  GLUCOSE 196* 322*  BUN 38* 47*  CREATININE 1.35* 1.54*  CALCIUM 8.3* 8.3*  MG  --  2.3  AST 57*  --   ALT 14  --   ALKPHOS 118  --   BILITOT 2.4*  --    Cardiac Enzymes Recent Labs  Lab 03/18/2018 1334  TROPONINI <0.03   RADIOLOGY:  Dg Abd 1 View  Result Date: 03/11/2018 CLINICAL DATA:  OG tube placement. EXAM: ABDOMEN - 1 VIEW COMPARISON:  03/06/2018 FINDINGS: The enteric tube tip projects over the expected location of the proximal body of stomach. The side port is at or just below the level of the GE junction. Air-filled loops of large bowel are again noted. IMPRESSION: 1. OG tube tip overlies the expected location of the proximal body of stomach. Electronically Signed   By: Kerby Moors M.D.   On: 03/11/2018 08:59   Dg Abdomen 1 View  Result Date: 03/18/2018 CLINICAL DATA:  Orogastric tube placement. EXAM: ABDOMEN - 1 VIEW COMPARISON:  None. FINDINGS: Orogastric  tube tip is seen overlying the mid to distal portion of the stomach. IMPRESSION: Orogastric tube tip overlies the mid to distal stomach. Electronically Signed   By: Earle Gell M.D.   On: 03/20/2018 14:35   US Renal  Result Date: 03/11/2018 CLINICAL DATA:  84 year old female with a history of acute kidney injury EXAM: RENAL / URINARY TRACT ULTRASOUND COMPLETE COMPARISON:  None. FINDINGS: Right Kidney: Length: 10.6 cm. Cortical thinning of the right kidney. No hydronephrosis. No focal lesion. Flow confirmed in the hilum of the right kidney. Left Kidney: Length: 11.4 cm. Cortical thinning of the left kidney. No  hydronephrosis. No focal lesion. Flow confirmed in the hilum. Bladder: Appears normal for degree of bladder distention. IMPRESSION: No evidence of hydronephrosis of the left or right kidney. Bilateral renal cortical thinning, suggesting chronic medical renal disease. Electronically Signed   By: Corrie Mckusick D.O.   On: 03/11/2018 13:43   Dg Chest Port 1 View  Result Date: 03/27/2018 CLINICAL DATA:  Acute respiratory failure with hypoxia. Sepsis. Endotracheal tube placement. EXAM: PORTABLE CHEST 1 VIEW COMPARISON:  03/04/2018 FINDINGS: Endotracheal tube is seen in place with tip approximately 3 cm above the carina. Orogastric tube is seen entering the stomach. Pulmonary hyperinflation again seen, consistent with COPD. Both lungs are clear. Heart size is normal. Aortic atherosclerosis. Several old left rib fracture deformities are again seen. IMPRESSION: Endotracheal tube and orogastric tube in appropriate position. COPD.  No active lung disease. Electronically Signed   By: Earle Gell M.D.   On: 03/24/2018 14:36   [place] Ekg Site Rite  Result Date: 03/11/2018 If Site Rite image not attached, placement could not be confirmed due to current cardiac rhythm.  ASSESSMENT AND PLAN:   Cannon Quinton  is a 84 y.o. female with a recent emergency room visit on 03/04/2018 and treated for a urinary tract infection with Keflex.  The patient was brought in via EMS and found to have a temperature of 102.9 respirations in the 40s and obtunded.  1.  Clinical sepsis, acute encephalopathy and relative hypotension.  Lactic acidosis -.  Patient had a recent diagnosis of urinary tract infection. -  Fever, leukocytosis and tachycardia.  - Sepsis protocol and aggressive antibiotics .  If -on vanc and meropenem---descalate abxs -Urine culture negative BC 1/2 staph species -pt now extubated  2.  History of stroke we will switch aspirin to rectal for right now   3.  History of seizure and meningioma on Keppra.  Will switch to IV  for now.  4.  Hypothyroidism unspecified on levothyroxine.    5.  Type 2 diabetes mellitus.  We will put on sliding scale  Spoke with der in the room   Case discussed with Care Management/Social Worker. Management plans discussed with the patient, family and they are in agreement.  CODE STATUS: full  DVT Prophylaxis: heparin  TOTAL TIME TAKING CARE OF THIS PATIENT: *30* minutes.  >50% time spent on counselling and coordination of care  POSSIBLE D/C IN *1-2 DAYS, DEPENDING ON CLINICAL CONDITION.  Note: This dictation was prepared with Dragon dictation along with smaller phrase technology. Any transcriptional errors that result from this process are unintentional.  Fritzi Mandes M.D on 03/11/2018 at 7:59 PM  Between 7am to 6pm - Pager - [phone]  After 6pm go to [email or web address] - password [email or web address]  Office  [phone]  CC: Primary care physician; Herminio Commons, MDPatient ID: Myrtie Neither, female   DOB: October 11, 1930, 84  y.o.   MRN: [id number]

## 2018-03-11 NOTE — Progress Notes (Addendum)
Inpatient Diabetes Program Recommendations  AACE/ADA: New Consensus Statement on Inpatient Glycemic Control (2015)  Target Ranges:  Prepandial:   less than 140 mg/dL      Peak postprandial:   less than 180 mg/dL (1-2 hours)      Critically ill patients:  140 - 180 mg/dL   Results for Vanessa [name], Vanessa [name] (MRN [id number]) as of 03/11/2018 08:26  Ref. Range 03/12/2018 16:29 03/11/2018 08:12  Glucose-Capillary Latest Ref Range: 65 - 99 mg/dL 252 (H) 259 (H)     Admit with: Sepsis  History: DM, CVA  Home DM Meds: None Listed  Current Insulin Orders: None    MD- Please consider placing orders for ICU Glycemic Control Protocol.  Could try starting with Phase 1 with SQ Novolog SSI Q4 hours first before proceeding to Phase 2 IV Insulin drip if CBGs remain >250 mg/dl     --Will follow patient during hospitalization--  Wyn Quaker RN, MSN, CDE Diabetes Coordinator Inpatient Glycemic Control Team Team Pager: [phone] (8a-5p)

## 2018-03-11 NOTE — Progress Notes (Signed)
Placed order for PICC team to call daughter for PICC line consent. Daughter is waiting for call.

## 2018-03-11 NOTE — Progress Notes (Signed)
Peripherally Inserted Central Catheter/Midline Placement  The IV Nurse has discussed with the patient and/or persons authorized to consent for the patient, the purpose of this procedure and the potential benefits and risks involved with this procedure.  The benefits include less needle sticks, lab draws from the catheter, and the patient may be discharged home with the catheter. Risks include, but not limited to, infection, bleeding, blood clot (thrombus formation), and puncture of an artery; nerve damage and irregular heartbeat and possibility to perform a PICC exchange if needed/ordered by physician.  Alternatives to this procedure were also discussed.  Bard Power PICC patient education guide, fact sheet on infection prevention and patient information card has been provided to patient /or left at bedside.    PICC/Midline Placement Documentation  PICC Triple Lumen 79/48/01 PICC Right Basilic 36 cm 0 cm (Active)  Indication for Insertion or Continuance of Line Limited venous access - need for IV therapy >5 days (PICC only);Poor Vasculature-patient has had multiple peripheral attempts or PIVs lasting less than 24 hours 03/11/2018  9:08 PM  Exposed Catheter (cm) 0 cm 03/11/2018  9:08 PM  Site Assessment Clean;Dry;Intact 03/11/2018  9:08 PM  Lumen #1 Status Flushed;Saline locked;Blood return noted 03/11/2018  9:08 PM  Lumen #2 Status Flushed;Saline locked;Blood return noted 03/11/2018  9:08 PM  Lumen #3 Status Flushed;Saline locked;Blood return noted 03/11/2018  9:08 PM  Dressing Type Transparent 03/11/2018  9:08 PM  Dressing Status Clean;Dry;Intact;Antimicrobial disc in place 03/11/2018  9:08 PM  Dressing Change Due 03/18/18 03/11/2018  9:08 PM       Elic Vencill, Nicolette Bang 03/11/2018, 9:09 PM

## 2018-03-11 NOTE — Progress Notes (Signed)
[name] goal of 60 per Dr. Alva Garnet. Dr.Simonds is  aware that pt is confused.

## 2018-03-12 DIAGNOSIS — R079 Chest pain, unspecified: Secondary | ICD-10-CM

## 2018-03-12 DIAGNOSIS — Z7189 Other specified counseling: Secondary | ICD-10-CM

## 2018-03-12 LAB — GLUCOSE, CAPILLARY
GLUCOSE-CAPILLARY: 153 mg/dL — AB (ref 65–99)
GLUCOSE-CAPILLARY: 129 mg/dL — AB (ref 65–99)
Glucose-Capillary: 123 mg/dL — ABNORMAL HIGH (ref 65–99)

## 2018-03-12 LAB — CBC
HEMATOCRIT: 28 % — AB (ref 35.0–47.0)
Hemoglobin: 9.9 g/dL — ABNORMAL LOW (ref 12.0–16.0)
MCH: 32.3 pg (ref 26.0–34.0)
MCHC: 35.2 g/dL (ref 32.0–36.0)
MCV: 91.8 fL (ref 80.0–100.0)
PLATELETS: 329 10*3/uL (ref 150–440)
RBC: 3.05 MIL/uL — ABNORMAL LOW (ref 3.80–5.20)
RDW: 15.1 % — AB (ref 11.5–14.5)
WBC: 46.7 10*3/uL — ABNORMAL HIGH (ref 3.6–11.0)

## 2018-03-12 LAB — BASIC METABOLIC PANEL
Anion gap: 10 (ref 5–15)
BUN: 56 mg/dL — AB (ref 6–20)
CHLORIDE: 109 mmol/L (ref 101–111)
CO2: 19 mmol/L — ABNORMAL LOW (ref 22–32)
CREATININE: 1.64 mg/dL — AB (ref 0.44–1.00)
Calcium: 8.5 mg/dL — ABNORMAL LOW (ref 8.9–10.3)
GFR calc Af Amer: 32 mL/min — ABNORMAL LOW (ref >60)
GFR, EST NON AFRICAN AMERICAN: 27 mL/min — AB (ref >60)
GLUCOSE: 161 mg/dL — AB (ref 65–99)
POTASSIUM: 3.6 mmol/L (ref 3.5–5.1)
Sodium: 138 mmol/L (ref 135–145)

## 2018-03-12 LAB — CULTURE, BLOOD (ROUTINE X 2): Special Requests: ADEQUATE

## 2018-03-12 LAB — PROCALCITONIN: PROCALCITONIN: 2.31 ng/mL

## 2018-03-12 MED ORDER — PHENYLEPHRINE HCL 10 MG/ML IJ SOLN
0.0000 ug/min | INTRAMUSCULAR | Status: DC
  Administered 2018-03-12: 60 ug/min via INTRAVENOUS
  Administered 2018-03-13: 200 ug/min via INTRAVENOUS
  Administered 2018-03-13: 130 ug/min via INTRAVENOUS
  Filled 2018-03-12: qty 40
  Filled 2018-03-12: qty 4
  Filled 2018-03-12 (×2): qty 40

## 2018-03-12 MED ORDER — MORPHINE SULFATE (PF) 2 MG/ML IV SOLN
1.0000 mg | INTRAVENOUS | Status: DC | PRN
  Administered 2018-03-12: 1 mg via INTRAVENOUS
  Administered 2018-03-13: 2 mg via INTRAVENOUS
  Filled 2018-03-12 (×2): qty 1

## 2018-03-12 MED ORDER — PHENYLEPHRINE HCL-NACL 10-0.9 MG/250ML-% IV SOLN
0.0000 ug/min | INTRAVENOUS | Status: DC
  Administered 2018-03-12: 50 ug/min via INTRAVENOUS
  Administered 2018-03-12: 10 ug/min via INTRAVENOUS
  Filled 2018-03-12 (×2): qty 250

## 2018-03-12 MED ORDER — TRAZODONE HCL 50 MG PO TABS
50.0000 mg | ORAL_TABLET | Freq: Every evening | ORAL | Status: DC | PRN
  Administered 2018-03-12: 50 mg via ORAL
  Filled 2018-03-12: qty 1

## 2018-03-12 MED ORDER — METOPROLOL TARTRATE 5 MG/5ML IV SOLN
2.5000 mg | INTRAVENOUS | Status: DC | PRN
  Administered 2018-03-12 – 2018-03-13 (×3): 2.5 mg via INTRAVENOUS
  Filled 2018-03-12 (×3): qty 5

## 2018-03-12 MED ORDER — ALBUTEROL SULFATE (2.5 MG/3ML) 0.083% IN NEBU
2.5000 mg | INHALATION_SOLUTION | RESPIRATORY_TRACT | Status: DC | PRN
  Administered 2018-03-12 (×3): 2.5 mg via RESPIRATORY_TRACT
  Filled 2018-03-12 (×3): qty 3

## 2018-03-12 MED ORDER — ORAL CARE MOUTH RINSE
15.0000 mL | Freq: Two times a day (BID) | OROMUCOSAL | Status: DC
  Administered 2018-03-12 – 2018-03-14 (×4): 15 mL via OROMUCOSAL

## 2018-03-12 MED ORDER — CHLORHEXIDINE GLUCONATE 0.12 % MT SOLN
15.0000 mL | Freq: Two times a day (BID) | OROMUCOSAL | Status: DC
  Administered 2018-03-12 – 2018-03-14 (×6): 15 mL via OROMUCOSAL
  Filled 2018-03-12: qty 15

## 2018-03-12 MED ORDER — INSULIN ASPART 100 UNIT/ML ~~LOC~~ SOLN
0.0000 [IU] | SUBCUTANEOUS | Status: DC
  Administered 2018-03-12 (×3): 2 [IU] via SUBCUTANEOUS
  Administered 2018-03-12 – 2018-03-13 (×2): 3 [IU] via SUBCUTANEOUS
  Administered 2018-03-13: 8 [IU] via SUBCUTANEOUS
  Administered 2018-03-13: 3 [IU] via SUBCUTANEOUS
  Administered 2018-03-13: 5 [IU] via SUBCUTANEOUS
  Administered 2018-03-14: 3 [IU] via SUBCUTANEOUS
  Administered 2018-03-14: 2 [IU] via SUBCUTANEOUS
  Administered 2018-03-14: 5 [IU] via SUBCUTANEOUS
  Filled 2018-03-12 (×9): qty 1

## 2018-03-12 NOTE — Progress Notes (Signed)
During rounds this am discussed with Dr Leonidas Romberg regarding patients on going decreased urine output. Last night 163 ml. My shift from 7 am 40 ml. MD is aware that patients heart rate fluctuates into the 160's range. EKG obtained this am. Requested MD to place sliding scale orders. WBC count 46.7 with a temperature. BUN and Creatinine remain elevated. At this time no orders given for swallowing evaluation. He will be having meeting family regarding goals and treatment. No additional orders given.

## 2018-03-12 NOTE — Progress Notes (Signed)
Pt complaining of chest pain. Dr. Alva Garnet notified. STAT EKG ordered per Dr. Alva Garnet. Daughter, [name] notified and states that she will call call [name] (pt's other daughter).

## 2018-03-12 NOTE — Progress Notes (Signed)
[facility] Progress Note Patient Name: Vanessa [name] DOB: 02/27/1931 MRN: [id number]   Date of Service  03/12/2018  HPI/Events of Note  Wheezing   eICU Interventions  Will order: 1. Albuterol 2.5 mg via Neb Q 3 hours PNR wheezing or SOB.     Intervention Category Major Interventions: Other:  Lysle Dingwall 03/12/2018, 6:27 AM

## 2018-03-12 NOTE — Progress Notes (Signed)
Pharmacy Antibiotic Note  Vanessa [name] is a 84 y.o. female admitted on 03/08/2018 with pneumonia.  Pharmacy has been consulted for Cefepime and vanco dosing.  6/10 Cefepime now discontinued. Pharmacy now consulted for meropenem dosing for PMH of ESBL infection.   Plan:  Continue Vancomycin 1g IV q36h. Will order random vanc level 6/11 @ 1830 prior to next dose to confirm patient is clearing Vanc.  Vanc trough ordered 6/13.   Continue Meropenem 1g IV every 12 hours backed on current CrCl <16ml.min.   ke 0.029, t1/2 23.90, Vd 38.3, IBW 54.7kg    Height: 5\' 4"  (162.6 cm) Weight: 152 lb 1.9 oz (69 kg) IBW/kg (Calculated) : 54.7  Temp (24hrs), Avg:98.2 F (36.8 C), Min:96.4 F (35.8 C), Max:100.8 F (38.2 C)  Recent Labs  Lab 03/28/2018 1334 03/05/2018 1645 03/11/18 0316 03/12/18 0444  WBC 34.2*  --  33.9* 46.7*  CREATININE 1.35*  --  1.54* 1.64*  LATICACIDVEN 2.0* 1.8  --   --     Estimated Creatinine Clearance: 23.9 mL/min (A) (by C-G formula based on SCr of 1.64 mg/dL (H)).    Allergies  Allergen Reactions  . Benadryl [Diphenhydramine Hcl] Other (See Comments)    Pt states that it makes her legs jump.   . Codeine Nausea And Vomiting  . Oxycodone Nausea And Vomiting and Other (See Comments)    "Makes me real irritable and I feel terrible."  . Penicillins Other (See Comments)    Pt states that it does not work for her.   Has patient had a PCN reaction causing immediate rash, facial/tongue/throat swelling, SOB or lightheadedness with hypotension: No Has patient had a PCN reaction causing severe rash involving mucus membranes or skin necrosis: No Has patient had a PCN reaction that required hospitalization: No Has patient had a PCN reaction occurring within the last 10 years: No If all of the above answers are "NO", then may proceed with Cephalosporin use.     Antimicrobials this admission: 6/9 Vanco >>  6/9 Cefepime >>  6/10 6/10 meropenem>>   Dose adjustments this  admission:   Microbiology results: 6/9 BCx: Coag (-) Staph (1 of 4)  6/9  BCID Staph species  6/9 UCx: NG Final  6/9 MRSA PCR: negative   Thank you for allowing pharmacy to be a part of this patient's care.  Pernell Dupre, PharmD, BCPS Clinical Pharmacist 03/12/2018 11:37 AM

## 2018-03-12 NOTE — Progress Notes (Signed)
PT Cancellation Note  Patient Details Name: Vanessa [name] MRN: [id number] DOB: 1931/05/23   Cancelled Treatment:    Reason Eval/Treat Not Completed: Patient not medically ready Spoke with nursing who states that pt has pt's HR and BP have been labile, and that generally she is not ready to try much with PT today (extubated yesterday) and PT/nurse agree to hold pt today and re-assess appropriateness tomorrow.  Kreg Shropshire 03/12/2018, 2:00 PM

## 2018-03-12 NOTE — Progress Notes (Signed)
PT PROFILE: 33F PMH of colon cancer, diabetes, hypothyroidism, CVA, presented to the ED 6/09 via EMS with SOB, fever. H/O ESBL UTI. Urine was reportedly foul. In ED, her respiratory status deteriorated and she required intubation.    SUBJ Tolerating extubation.  Remains poorly oriented.  Urine output remains poor.  PICC line placed yesterday.  On vasopressors.  Tachyarrhythmias this morning  Vitals:   03/12/18 1300 03/12/18 1330 03/12/18 1400 03/12/18 1430  BP: 97/71 (!) 106/46 (!) 98/51 (!) 99/47  Pulse: (!) 124 (!) 117 97 96  Resp: (!) 39 (!) 39 (!) 38 (!) 30  Temp: (!) 101.1 F (38.4 C) (!) 101.1 F (38.4 C) (!) 101.1 F (38.4 C) (!) 100.9 F (38.3 C)  TempSrc:      SpO2: 99% 97% 98% 97%  Weight:      Height:      2LPM [place] O2  RASS 0, + F/C, poorly oriented Tolerating extubation HEENT WNL No JVD noted No wheezes, bibasilar crackles Cardiac, frequent extrasystoles, no M Abdomen soft, NT, diminished BS Extremities warm, no edema Left hand contractures   BMP Latest Ref Rng & Units 03/12/2018 03/11/2018 03/16/2018  Glucose 65 - 99 mg/dL 161(H) 322(H) 196(H)  BUN 6 - 20 mg/dL 56(H) 47(H) 38(H)  Creatinine 0.44 - 1.00 mg/dL 1.64(H) 1.54(H) 1.35(H)  Sodium 135 - 145 mmol/L 138 133(L) 135  Potassium 3.5 - 5.1 mmol/L 3.6 4.0 4.0  Chloride 101 - 111 mmol/L 109 103 104  CO2 22 - 32 mmol/L 19(L) 21(L) 22  Calcium 8.9 - 10.3 mg/dL 8.5(L) 8.3(L) 8.3(L)   CBC Latest Ref Rng & Units 03/12/2018 03/11/2018 03/03/2018  WBC 3.6 - 11.0 K/uL 46.7(H) 33.9(H) 34.2(H)  Hemoglobin 12.0 - 16.0 g/dL 9.9(L) 9.8(L) 10.4(L)  Hematocrit 35.0 - 47.0 % 28.0(L) 26.9(L) 29.0(L)  Platelets 150 - 440 K/uL 329 239 243   CXR: NNF  IMPRESSION: Acute respiratory failure, much improved VDRF, resolved Hypotension, suspect septic shock Tachyarrhythmias (PAT, frequent PACs) Oliguric AKI, very poor candidate for hemodialysis Acute encephalopathy, improving History of ESBL UTI Suspected severe  sepsis  PLAN/REC: Continue supplemental oxygen to maintain SPO2 >90% Discontinue IVFs Norepinephrine changed to phenylephrine Maintain MAP >65 mmHg Very low-dose metoprolol to maintain HR <115/min Cont meropenem initiated 6/10   FAMILY: I met with patient's husband and 2 daughters.  We discussed goals of care.  I emphasized patient's chronic debilitated state.  I recommended that we consider DNR/DNI status at this point.  They will consider these matters.  It might be beneficial to enlist palliative care in the near future.  Merton Border, MD PCCM service Mobile [phone] Pager [phone] 03/12/2018 3:36 PM

## 2018-03-12 NOTE — Progress Notes (Signed)
Inpatient Diabetes Program Recommendations  AACE/ADA: New Consensus Statement on Inpatient Glycemic Control (2015)  Target Ranges:  Prepandial:   less than 140 mg/dL      Peak postprandial:   less than 180 mg/dL (1-2 hours)      Critically ill patients:  140 - 180 mg/dL   Results for Vanessa [name], Vanessa [name] (MRN [id number]) as of 03/12/2018 07:46  Ref. Range 03/17/2018 16:29 03/11/2018 08:12  Glucose-Capillary Latest Ref Range: 65 - 99 mg/dL 252 (H) 259 (H)    Admit with: Sepsis  History: DM, CVA  Home DM Meds: None Listed  Current Insulin Orders: None      MD-Please consider placing orders for Novolog Sensitive Correction Scale/ SSI (0-9 units) TID AC + HS      --Will follow patient during hospitalization--  Wyn Quaker RN, MSN, CDE Diabetes Coordinator Inpatient Glycemic Control Team Team Pager: [phone] (8a-5p)

## 2018-03-12 NOTE — Progress Notes (Signed)
[facility] at [place] NAME: Vanessa [name]    MR#:  [id number]  DATE OF BIRTH:  Oct 28, 1930  SUBJECTIVE:  patient more awake. She is breathing a little hard. Tachycardic with heart rate in the 120s. Wants to drink water. Daughter in the room.  REVIEW OF SYSTEMS:   Review of Systems  Unable to perform ROS: Medical condition   Tolerating Diet: NPO pending speech eval Tolerating PT: pending  DRUG ALLERGIES:   Allergies  Allergen Reactions  . Benadryl [Diphenhydramine Hcl] Other (See Comments)    Pt states that it makes her legs jump.   . Codeine Nausea And Vomiting  . Oxycodone Nausea And Vomiting and Other (See Comments)    "Makes me real irritable and I feel terrible."  . Penicillins Other (See Comments)    Pt states that it does not work for her.   Has patient had a PCN reaction causing immediate rash, facial/tongue/throat swelling, SOB or lightheadedness with hypotension: No Has patient had a PCN reaction causing severe rash involving mucus membranes or skin necrosis: No Has patient had a PCN reaction that required hospitalization: No Has patient had a PCN reaction occurring within the last 10 years: No If all of the above answers are "NO", then may proceed with Cephalosporin use.     VITALS:  Blood pressure (!) 98/51, pulse 97, temperature (!) 101.1 F (38.4 C), resp. rate (!) 38, height 5\' 4"  (1.626 m), weight 69 kg (152 lb 1.9 oz), SpO2 98 %.  PHYSICAL EXAMINATION:   Physical Exam  GENERAL:  84 y.o.-year-old patient lying in the bed with mild to modrate acute distress.  EYES: Pupils equal, round, reactive to light and accommodation. No scleral icterus. Extraocular muscles intact.  HEENT: Head atraumatic, normocephalic. Oropharynx and nasopharynx clear.  NECK:  Supple, no jugular venous distention. No thyroid enlargement, no tenderness.  LUNGS: Normal breath sounds bilaterally, no wheezing, rales, rhonchi. No use of  accessory muscles of respiration.  CARDIOVASCULAR: S1, S2 normal. No murmurs, rubs, or gallops. tachycardia ABDOMEN: Soft, nontender, nondistended. Bowel sounds present. No organomegaly or mass.  EXTREMITIES: No cyanosis, clubbing or edema b/l.    NEUROLOGIC: limited assessment due to medical condition. Moves all extremities well. PSYCHIATRIC:  patient is alert and oriented x 2.  SKIN: No obvious rash, lesion, or ulcer.   LABORATORY PANEL:  CBC Recent Labs  Lab 03/12/18 0444  WBC 46.7*  HGB 9.9*  HCT 28.0*  PLT 329    Chemistries  Recent Labs  Lab 03/19/2018 1334 03/11/18 0316 03/12/18 0444  NA 135 133* 138  K 4.0 4.0 3.6  CL 104 103 109  CO2 22 21* 19*  GLUCOSE 196* 322* 161*  BUN 38* 47* 56*  CREATININE 1.35* 1.54* 1.64*  CALCIUM 8.3* 8.3* 8.5*  MG  --  2.3  --   AST 57*  --   --   ALT 14  --   --   ALKPHOS 118  --   --   BILITOT 2.4*  --   --    Cardiac Enzymes Recent Labs  Lab 03/11/2018 1334  TROPONINI <0.03   RADIOLOGY:  Dg Abd 1 View  Result Date: 03/11/2018 CLINICAL DATA:  OG tube placement. EXAM: ABDOMEN - 1 VIEW COMPARISON:  03/30/2018 FINDINGS: The enteric tube tip projects over the expected location of the proximal body of stomach. The side port is at or just below the level of the GE junction. Air-filled loops of  large bowel are again noted. IMPRESSION: 1. OG tube tip overlies the expected location of the proximal body of stomach. Electronically Signed   By: Kerby Moors M.D.   On: 03/11/2018 08:59   US Renal  Result Date: 03/11/2018 CLINICAL DATA:  84 year old female with a history of acute kidney injury EXAM: RENAL / URINARY TRACT ULTRASOUND COMPLETE COMPARISON:  None. FINDINGS: Right Kidney: Length: 10.6 cm. Cortical thinning of the right kidney. No hydronephrosis. No focal lesion. Flow confirmed in the hilum of the right kidney. Left Kidney: Length: 11.4 cm. Cortical thinning of the left kidney. No hydronephrosis. No focal lesion. Flow confirmed in  the hilum. Bladder: Appears normal for degree of bladder distention. IMPRESSION: No evidence of hydronephrosis of the left or right kidney. Bilateral renal cortical thinning, suggesting chronic medical renal disease. Electronically Signed   By: Corrie Mckusick D.O.   On: 03/11/2018 13:43   [place] Ekg Site Rite  Result Date: 03/11/2018 If Site Rite image not attached, placement could not be confirmed due to current cardiac rhythm.  ASSESSMENT AND PLAN:   Vanessa [name]  is a 84 y.o. female with a recent emergency room visit on 03/04/2018 and treated for a urinary tract infection with Keflex.  The patient was brought in via EMS and found to have a temperature of 102.9 respirations in the 40s and obtunded.  1.  Clinical sepsis, acute metabolic encephalopathy and relative hypotension -clear etiology not able to establish -  Lactic acidosis, elevated Pro calcitonin -Patient had a recent diagnosis of urinary tract infection. -  Fever, leukocytosis and tachycardia.  - Sepsis protocol and aggressive antibiotics .  -on vanc and meropenem---descalate abxs -Urine culture negative BC 1/2 staph species -pt now extubated -count elevated to 46,000 -on IV pressors   2.  History of stroke we will switch aspirin to rectal for right now   3.  History of seizure and meningioma on Keppra.   -IV Keppra  4.  Hypothyroidism unspecified on levothyroxine.    5.  Type 2 diabetes mellitus.  We will put on sliding scale  Spoke with der in the room. Patient has a poor prognosis. She still remains a full code.  Case discussed with Care Management/Social Worker. Management plans discussed with the patient, family and they are in agreement.  CODE STATUS: full  DVT Prophylaxis: heparin  TOTAL TIME TAKING CARE OF THIS PATIENT: *30* minutes.  >50% time spent on counselling and coordination of care  POSSIBLE D/C IN *1-2 DAYS, DEPENDING ON CLINICAL CONDITION.  Note: This dictation was prepared with Dragon dictation  along with smaller phrase technology. Any transcriptional errors that result from this process are unintentional.  Fritzi Mandes M.D on 03/12/2018 at 2:27 PM  Between 7am to 6pm - Pager - [phone]  After 6pm go to [email or web address] - password [email or web address] [facility]  Office  [phone]  CC: Primary care physician; Herminio Commons, MDPatient ID: Vanessa [name], female   DOB: [birth year], 84 y.o.   MRN: [id number]

## 2018-03-12 NOTE — Progress Notes (Signed)
Dr. Alva Garnet notified of Pt status V tach per monitor. Strips printed for  Dr. Alva Garnet per request. Dr. Alva Garnet plans to meet with family today at 1300. Time per family request.

## 2018-03-13 ENCOUNTER — Inpatient Hospital Stay: Payer: Medicare Other

## 2018-03-13 DIAGNOSIS — N179 Acute kidney failure, unspecified: Secondary | ICD-10-CM

## 2018-03-13 DIAGNOSIS — E440 Moderate protein-calorie malnutrition: Secondary | ICD-10-CM

## 2018-03-13 DIAGNOSIS — A419 Sepsis, unspecified organism: Secondary | ICD-10-CM

## 2018-03-13 DIAGNOSIS — R0603 Acute respiratory distress: Secondary | ICD-10-CM

## 2018-03-13 LAB — CBC
HCT: 25.4 % — ABNORMAL LOW (ref 35.0–47.0)
HEMOGLOBIN: 9 g/dL — AB (ref 12.0–16.0)
MCH: 32.9 pg (ref 26.0–34.0)
MCHC: 35.3 g/dL (ref 32.0–36.0)
MCV: 93.2 fL (ref 80.0–100.0)
Platelets: 284 10*3/uL (ref 150–440)
RBC: 2.72 MIL/uL — AB (ref 3.80–5.20)
RDW: 15.3 % — ABNORMAL HIGH (ref 11.5–14.5)
WBC: 35.5 10*3/uL — ABNORMAL HIGH (ref 3.6–11.0)

## 2018-03-13 LAB — BASIC METABOLIC PANEL
ANION GAP: 8 (ref 5–15)
BUN: 52 mg/dL — ABNORMAL HIGH (ref 6–20)
CALCIUM: 7.7 mg/dL — AB (ref 8.9–10.3)
CHLORIDE: 116 mmol/L — AB (ref 101–111)
CO2: 18 mmol/L — AB (ref 22–32)
Creatinine, Ser: 1.38 mg/dL — ABNORMAL HIGH (ref 0.44–1.00)
GFR calc non Af Amer: 34 mL/min — ABNORMAL LOW (ref >60)
GFR, EST AFRICAN AMERICAN: 39 mL/min — AB (ref >60)
Glucose, Bld: 111 mg/dL — ABNORMAL HIGH (ref 65–99)
Potassium: 3.1 mmol/L — ABNORMAL LOW (ref 3.5–5.1)
Sodium: 142 mmol/L (ref 135–145)

## 2018-03-13 LAB — GLUCOSE, CAPILLARY
GLUCOSE-CAPILLARY: 121 mg/dL — AB (ref 65–99)
GLUCOSE-CAPILLARY: 183 mg/dL — AB (ref 65–99)
GLUCOSE-CAPILLARY: 249 mg/dL — AB (ref 65–99)
GLUCOSE-CAPILLARY: 252 mg/dL — AB (ref 65–99)
Glucose-Capillary: 108 mg/dL — ABNORMAL HIGH (ref 65–99)
Glucose-Capillary: 112 mg/dL — ABNORMAL HIGH (ref 65–99)
Glucose-Capillary: 167 mg/dL — ABNORMAL HIGH (ref 65–99)

## 2018-03-13 LAB — URINALYSIS, COMPLETE (UACMP) WITH MICROSCOPIC
Bacteria, UA: NONE SEEN
Bilirubin Urine: NEGATIVE
GLUCOSE, UA: NEGATIVE mg/dL
Ketones, ur: 5 mg/dL — AB
Leukocytes, UA: NEGATIVE
Nitrite: NEGATIVE
PH: 5 (ref 5.0–8.0)
Protein, ur: NEGATIVE mg/dL
SPECIFIC GRAVITY, URINE: 1.012 (ref 1.005–1.030)

## 2018-03-13 LAB — PROCALCITONIN: PROCALCITONIN: 3.74 ng/mL

## 2018-03-13 MED ORDER — OSMOLITE 1.5 CAL PO LIQD
1000.0000 mL | ORAL | Status: DC
  Administered 2018-03-13 – 2018-03-14 (×2): 1000 mL

## 2018-03-13 MED ORDER — LEVOTHYROXINE SODIUM 75 MCG PO TABS
75.0000 ug | ORAL_TABLET | Freq: Every day | ORAL | Status: DC
  Administered 2018-03-14: 75 ug via ORAL
  Filled 2018-03-13: qty 1

## 2018-03-13 MED ORDER — JEVITY 1.2 CAL PO LIQD: 1000.0000 mL | ORAL | Status: DC

## 2018-03-13 MED ORDER — ADULT MULTIVITAMIN LIQUID CH
15.0000 mL | Freq: Every day | ORAL | Status: DC
  Administered 2018-03-13: 15 mL
  Filled 2018-03-13 (×2): qty 15

## 2018-03-13 MED ORDER — ACETAMINOPHEN 325 MG PO TABS
650.0000 mg | ORAL_TABLET | Freq: Four times a day (QID) | ORAL | Status: DC | PRN
  Administered 2018-03-13: 650 mg
  Filled 2018-03-13: qty 2

## 2018-03-13 MED ORDER — VANCOMYCIN HCL IN DEXTROSE 1-5 GM/200ML-% IV SOLN
1000.0000 mg | Freq: Once | INTRAVENOUS | Status: AC
  Administered 2018-03-13: 1000 mg via INTRAVENOUS
  Filled 2018-03-13: qty 200

## 2018-03-13 MED ORDER — HALOPERIDOL LACTATE 5 MG/ML IJ SOLN
2.5000 mg | Freq: Once | INTRAMUSCULAR | Status: AC
  Administered 2018-03-13: 2.5 mg via INTRAVENOUS
  Filled 2018-03-13: qty 1

## 2018-03-13 MED ORDER — DEXMEDETOMIDINE HCL IN NACL 400 MCG/100ML IV SOLN
0.4000 ug/kg/h | INTRAVENOUS | Status: DC
  Administered 2018-03-13: 0.4 ug/kg/h via INTRAVENOUS
  Filled 2018-03-13: qty 100

## 2018-03-13 MED ORDER — FREE WATER
100.0000 mL | Freq: Three times a day (TID) | Status: DC
  Administered 2018-03-13 – 2018-03-14 (×3): 100 mL

## 2018-03-13 MED ORDER — POTASSIUM CHLORIDE 10 MEQ/50ML IV SOLN
10.0000 meq | INTRAVENOUS | Status: AC
  Administered 2018-03-13 (×2): 10 meq via INTRAVENOUS
  Filled 2018-03-13 (×2): qty 50

## 2018-03-13 MED ORDER — MORPHINE SULFATE (PF) 2 MG/ML IV SOLN
2.0000 mg | INTRAVENOUS | Status: DC | PRN
  Administered 2018-03-13 – 2018-03-14 (×4): 2 mg via INTRAVENOUS
  Filled 2018-03-13 (×4): qty 1

## 2018-03-13 MED ORDER — OSMOLITE 1.5 CAL PO LIQD
1000.0000 mL | ORAL | Status: DC
Start: 2018-03-13 — End: 2018-03-13

## 2018-03-13 MED ORDER — FUROSEMIDE 10 MG/ML IJ SOLN
20.0000 mg | Freq: Once | INTRAMUSCULAR | Status: AC
  Administered 2018-03-13: 20 mg via INTRAVENOUS
  Filled 2018-03-13: qty 2

## 2018-03-13 MED ORDER — VANCOMYCIN HCL IN DEXTROSE 750-5 MG/150ML-% IV SOLN
750.0000 mg | INTRAVENOUS | Status: DC
  Administered 2018-03-13: 750 mg via INTRAVENOUS
  Filled 2018-03-13 (×2): qty 150

## 2018-03-13 MED ORDER — PRO-STAT SUGAR FREE PO LIQD
30.0000 mL | Freq: Two times a day (BID) | ORAL | Status: DC
  Administered 2018-03-13 (×2): 30 mL

## 2018-03-13 MED ORDER — ACETAMINOPHEN 650 MG RE SUPP: 650.0000 mg | Freq: Four times a day (QID) | RECTAL | Status: DC | PRN

## 2018-03-13 NOTE — Progress Notes (Signed)
Patient has been resting comfortably in bed tolerating BiPAP the rest of this shift. Will continue to assess and monitor and give off going, end of shift report to next oncoming shift at 0700.

## 2018-03-13 NOTE — Progress Notes (Signed)
Pharmacy Antibiotic Note  Vanessa [name] is a 84 y.o. female admitted on 03/16/2018 with pneumonia.  Pharmacy has been consulted for Cefepime and vanco dosing.  6/10 Cefepime now discontinued. Pharmacy now consulted for meropenem dosing for PMH of ESBL infection.   6/12 Pharmacy consulted to restart vancomycin   Plan:  Continue Meropenem 1g IV every 12 hours backed on current CrCl <72ml.min.   ke 0.029, t1/2 23.90, Vd 48.3  Vancomycin 1g IV x 1 dose ordered. Will start Vancomycin 750mg  IV every 24 hours with 7 hour stack dosing. Calculated trough at Css is 15.4. Will draw trough prior to 3rd dose.   Will monitor renal function and adjust dose as needed.    Height: 5\' 4"  (162.6 cm) Weight: 152 lb 1.9 oz (69 kg) IBW/kg (Calculated) : 54.7  Temp (24hrs), Avg:99.2 F (37.3 C), Min:95.4 F (35.2 C), Max:102.2 F (39 C)  Recent Labs  Lab 03/28/2018 1334 03/07/2018 1645 03/11/18 0316 03/12/18 0444 03/13/18 0341  WBC 34.2*  --  33.9* 46.7* 35.5*  CREATININE 1.35*  --  1.54* 1.64* 1.38*  LATICACIDVEN 2.0* 1.8  --   --   --     Estimated Creatinine Clearance: 28.4 mL/min (A) (by C-G formula based on SCr of 1.38 mg/dL (H)).    Allergies  Allergen Reactions  . Benadryl [Diphenhydramine Hcl] Other (See Comments)    Pt states that it makes her legs jump.   . Codeine Nausea And Vomiting  . Oxycodone Nausea And Vomiting and Other (See Comments)    "Makes me real irritable and I feel terrible."  . Penicillins Other (See Comments)    Pt states that it does not work for her.   Has patient had a PCN reaction causing immediate rash, facial/tongue/throat swelling, SOB or lightheadedness with hypotension: No Has patient had a PCN reaction causing severe rash involving mucus membranes or skin necrosis: No Has patient had a PCN reaction that required hospitalization: No Has patient had a PCN reaction occurring within the last 10 years: No If all of the above answers are "NO", then may proceed  with Cephalosporin use.     Antimicrobials this admission: 6/9 Vanco >> 6/10 6/9 Cefepime >>  6/10 6/10 meropenem>>  6/12 Vancomycin>>   Dose adjustments this admission:   Microbiology results: 6/9 BCx: Coag (-) Staph (1 of 4)  6/9  BCID Staph species  6/9 UCx: NG Final  6/9 MRSA PCR: negative   Thank you for allowing pharmacy to be a part of this patient's care.  Pernell Dupre, PharmD, BCPS Clinical Pharmacist 03/13/2018 11:16 AM

## 2018-03-13 NOTE — Progress Notes (Signed)
PT PROFILE: 66F PMH of colon cancer, diabetes, hypothyroidism, CVA, presented to the ED 6/09 via EMS with SOB, fever. H/O ESBL UTI. Urine was reportedly foul. In ED, her respiratory status deteriorated and she required intubation.    SUBJ Agitated and tachypneic last night.  Presently, crying out "help me, help me".  Transiently on BiPAP.  Calm now but very confused.  Remains on vasopressors.  Urine output remains poor.  Vitals:   03/13/18 1245 03/13/18 1300 03/13/18 1315 03/13/18 1330  BP: 121/65 117/74 (!) 121/50 117/75  Pulse: 94 95 94 93  Resp: (!) 28 (!) 30 (!) 24 (!) 26  Temp: (!) 96.8 F (36 C) (!) 96.8 F (36 C) (!) 96.8 F (36 C) (!) 96.8 F (36 C)  TempSrc:      SpO2: 100% 100% 99% 100%  Weight:      Height:      2LPM [place] O2  RASS -1, not F/C consistently, poorly oriented HEENT WNL No JVD noted Bibasilar crackles, no wheezes Frequent extrasystoles, no M Abdomen soft, NT, diminished BS Extremities warm, trace pretibial edema bilateral Left hand contractures   BMP Latest Ref Rng & Units 03/13/2018 03/12/2018 03/11/2018  Glucose 65 - 99 mg/dL 111(H) 161(H) 322(H)  BUN 6 - 20 mg/dL 52(H) 56(H) 47(H)  Creatinine 0.44 - 1.00 mg/dL 1.38(H) 1.64(H) 1.54(H)  Sodium 135 - 145 mmol/L 142 138 133(L)  Potassium 3.5 - 5.1 mmol/L 3.1(L) 3.6 4.0  Chloride 101 - 111 mmol/L 116(H) 109 103  CO2 22 - 32 mmol/L 18(L) 19(L) 21(L)  Calcium 8.9 - 10.3 mg/dL 7.7(L) 8.5(L) 8.3(L)   CBC Latest Ref Rng & Units 03/13/2018 03/12/2018 03/11/2018  WBC 3.6 - 11.0 K/uL 35.5(H) 46.7(H) 33.9(H)  Hemoglobin 12.0 - 16.0 g/dL 9.0(L) 9.9(L) 9.8(L)  Hematocrit 35.0 - 47.0 % 25.4(L) 28.0(L) 26.9(L)  Platelets 150 - 440 K/uL 284 329 239   CXR: Left basilar atelectasis versus infiltrate  IMPRESSION: Acute respiratory failure with hypoxemia VDRF, resolved Hypotension, suspect septic shock Tachyarrhythmias (PAT, frequent PACs) Oliguric AKI, very poor candidate for hemodialysis Acute  encephalopathy History of ESBL UTI Elevated PCT Possible LLL pneumonia Suspected severe sepsis  PLAN/REC: Continue supplemental oxygen to maintain SPO2 >90% Limit IV fluids Continue phenylephrine to maintain MAP >65 mmHg Continue very low-dose metoprolol to maintain HR <115/min Cont meropenem initiated 6/10 Add vancomycin, initiated 6/12 Palliative care consultation appreciated.  Patient now DNR but continue current efforts Minimize psychoactive medications  FAMILY:   Merton Border, MD PCCM service Mobile [phone] Pager [phone] 03/13/2018 2:36 PM

## 2018-03-13 NOTE — Progress Notes (Signed)
[facility] at [place] NAME: Vanessa [name]    MR#:  [id number]  DATE OF BIRTH:  [birth year]  SUBJECTIVE:  She is breathing a little hard. Tachycardic with heart rate in the 120s NG placed REVIEW OF SYSTEMS:   Review of Systems  Unable to perform ROS: Medical condition   Tolerating Diet: NPO pending speech eval Tolerating PT: pending  DRUG ALLERGIES:   Allergies  Allergen Reactions  . Benadryl [Diphenhydramine Hcl] Other (See Comments)    Pt states that it makes her legs jump.   . Codeine Nausea And Vomiting  . Oxycodone Nausea And Vomiting and Other (See Comments)    "Makes me real irritable and I feel terrible."  . Penicillins Other (See Comments)    Pt states that it does not work for her.   Has patient had a PCN reaction causing immediate rash, facial/tongue/throat swelling, SOB or lightheadedness with hypotension: No Has patient had a PCN reaction causing severe rash involving mucus membranes or skin necrosis: No Has patient had a PCN reaction that required hospitalization: No Has patient had a PCN reaction occurring within the last 10 years: No If all of the above answers are "NO", then may proceed with Cephalosporin use.     VITALS:  Blood pressure (!) 122/56, pulse (!) 106, temperature 98.1 F (36.7 C), resp. rate (!) 31, height 5\' 4"  (1.626 m), weight 69 kg (152 lb 1.9 oz), SpO2 100 %.  PHYSICAL EXAMINATION:   Physical Exam  GENERAL:  84 y.o.-year-old patient lying in the bed with mild to modrate acute distress.  EYES: Pupils equal, round, reactive to light and accommodation. No scleral icterus. Extraocular muscles intact.  HEENT: Head atraumatic, normocephalic. Oropharynx and nasopharynx clear. NG+ NECK:  Supple, no jugular venous distention. No thyroid enlargement, no tenderness.  LUNGS: Normal breath sounds bilaterally, no wheezing, rales, rhonchi. No use of accessory muscles of respiration.  CARDIOVASCULAR: S1,  S2 normal. No murmurs, rubs, or gallops. tachycardia ABDOMEN: Soft, nontender, nondistended. Bowel sounds present. No organomegaly or mass.  EXTREMITIES: No cyanosis, clubbing or edema b/l.    NEUROLOGIC: limited assessment due to medical condition. Moves all extremities well. PSYCHIATRIC:  patient is alert and oriented x 2.  SKIN: No obvious rash, lesion, or ulcer.   LABORATORY PANEL:  CBC Recent Labs  Lab 03/13/18 0341  WBC 35.5*  HGB 9.0*  HCT 25.4*  PLT 284    Chemistries  Recent Labs  Lab 03/09/2018 1334 03/11/18 0316  03/13/18 0341  NA 135 133*   < > 142  K 4.0 4.0   < > 3.1*  CL 104 103   < > 116*  CO2 22 21*   < > 18*  GLUCOSE 196* 322*   < > 111*  BUN 38* 47*   < > 52*  CREATININE 1.35* 1.54*   < > 1.38*  CALCIUM 8.3* 8.3*   < > 7.7*  MG  --  2.3  --   --   AST 57*  --   --   --   ALT 14  --   --   --   ALKPHOS 118  --   --   --   BILITOT 2.4*  --   --   --    < > = values in this interval not displayed.   Cardiac Enzymes Recent Labs  Lab 03/02/2018 1334  TROPONINI <0.03   RADIOLOGY:  Dg Abd 1 View  Result  Date: 03/13/2018 CLINICAL DATA:  Check nasogastric catheter EXAM: ABDOMEN - 1 VIEW COMPARISON:  03/11/2018 FINDINGS: Scattered large and small bowel gas is noted. Nasogastric catheter is noted extending into the stomach. Proximal side port lies at the gastroesophageal junction. This could be advanced several cm. No other focal abnormality is noted. IMPRESSION: Stable appearance of nasogastric catheter the proximal side port lies at the gastroesophageal junction. The tube could be advanced several cm as necessary. Electronically Signed   By: [name] M.D.   On: 03/13/2018 10:52   Dg Chest Port 1 View  Result Date: 03/13/2018 CLINICAL DATA:  Acute respiratory distress. EXAM: PORTABLE CHEST 1 VIEW COMPARISON:  Radiograph 03/06/2018 FINDINGS: Endotracheal and enteric tubes have been removed. Right upper extremity PICC with tip in the mid SVC. Lower lung  volumes from prior exam with bronchovascular crowding. Heart size is normal for technique with aortic atherosclerosis. Probable retrocardiac atelectasis. Biapical pleuroparenchymal scarring. No large pleural effusion or pneumothorax. Remote left rib fractures. IMPRESSION: 1. Low lung volumes with left lung base atelectasis. 2. Right upper extremity PICC with tip in the SVC. Electronically Signed   By: Jeb Levering M.D.   On: 03/13/2018 02:06   [place] Ekg Site Rite  Result Date: 03/11/2018 If Site Rite image not attached, placement could not be confirmed due to current cardiac rhythm.  ASSESSMENT AND PLAN:   Vanessa [name]  is a 84 y.o. female with a recent emergency room visit on 03/04/2018 and treated for a urinary tract infection with Keflex.  The patient was brought in via EMS and found to have a temperature of 102.9 respirations in the 40s and obtunded.  1.  Clinical sepsis, acute metabolic encephalopathy and relative hypotension -clear etiology not able to establish ?UTI ?LLL pNA -  Lactic acidosis, elevated Pro calcitonin -Patient had a recent diagnosis of urinary tract infection. -  Fever, leukocytosis and tachycardia.  - Sepsis protocol and aggressive antibiotics .  -on vanc and meropenem---descalate abxs -Urine culture negative BC 1/2 staph species -pt now extubated -count elevated to 46,000-35K -on IV pressors   2.  History of stroke we will switch aspirin to rectal for right now   3.  History of seizure and meningioma on Keppra.   -IV Keppra  4.  Hypothyroidism unspecified on levothyroxine.    5.  Type 2 diabetes mellitus.  We will put on sliding scale  . Patient has a poor prognosis. She is now DNR  Case discussed with Care Management/Social Worker.  CODE STATUSDNR  DVT Prophylaxis: heparin  TOTAL TIME TAKING CARE OF THIS PATIENT: *30* minutes.  >50% time spent on counselling and coordination of care  POSSIBLE D/C IN *1-2 DAYS, DEPENDING ON CLINICAL  CONDITION.  Note: This dictation was prepared with Dragon dictation along with smaller phrase technology. Any transcriptional errors that result from this process are unintentional.  Fritzi Mandes M.D on 03/13/2018 at 3:06 PM  Between 7am to 6pm - Pager - [phone]  After 6pm go to [email or web address] - password [email or web address] [facility]  Office  [phone]  CC: Primary care physician; Herminio Commons, MDPatient ID: Vanessa [name], female   DOB: July 01, 1931, 84 y.o.   MRN: [id number]

## 2018-03-13 NOTE — Progress Notes (Signed)
Initial Nutrition Assessment  DOCUMENTATION CODES:   Non-severe (moderate) malnutrition in context of chronic illness  INTERVENTION:  Initiate Osmolite 1.5 Cal at 40 mL/hr + Pro-Stat 30 mL BID via NGT. Provides 1640 kcal, 90 grams of protein, 730 mL H2O daily.  Provide liquid MVI daily per tube.  With free water flush of 100 mL Q8hrs, patient is receiving a total of 1030 mL H2O daily.  NUTRITION DIAGNOSIS:   Moderate Malnutrition related to chronic illness(hx CVA, hx colon cancer, advanced age) as evidenced by mild fat depletion, mild muscle depletion, moderate muscle depletion.  GOAL:   Patient will meet greater than or equal to 90% of their needs  MONITOR:   Diet advancement, Labs, Weight trends, TF tolerance, I & O's  REASON FOR ASSESSMENT:   Consult Enteral/tube feeding initiation and management  ASSESSMENT:   84 year old female with PMHx of DM type 2, hx CVA, colon cancer s/p "colon surgery" and hypothyroidism who was admitted with acute respiratory failure requiring intubation 6/9 to 6/10, hypotension from suspected septic shock, tachyarrhythmias, oliguric AKI, acute encephalopathy.   -Pending PMT consult to discuss goals of care.  Met with patient at bedside. She is unable to provide any history at this time. Kept repeating "help me." She is currently on 3 L/min [place]. No family members at bedside at time of RD assessment. Weight appears fairly stable in chart. It has been fluctuating between 143-150 lbs.  Access: 18 Fr. NGT placed 6/12; per abdominal x-ray on 6/12 tip terminates in stomach and side port is at GE junction with recommendation to advance several cm; tube has been advanced and now terminates at 57 cm  MAP: 54-101 mmHg  Medications reviewed and include: free water flush 100 mL Q8hrs, Novolog 0-15 units Q4hrs, levothyroxine, Keppra, meropenem, phenylephrine gtt at 130 mcg/min, potassium chloride 10 mEq IV 2 times today, vancomycin.  Labs reviewed: CBG  108-112, Potassium 3.1, Chloride 116, CO2 18, BUN 52, Creatinine 1.38.  I/O: UOP 470 mL yesterday (0.3 mL/kg/hr)  No weight to trend since 6/9.  Discussed with RN and on rounds. NGT has already been placed and plan is for initiation of tube feeds. Patient not appropriate for swallowing evaluation at this time.  NUTRITION - FOCUSED PHYSICAL EXAM:    Most Recent Value  Orbital Region  Mild depletion  Upper Arm Region  Moderate depletion  Thoracic and Lumbar Region  Mild depletion  Buccal Region  Mild depletion  Temple Region  Mild depletion  Clavicle Bone Region  Mild depletion  Clavicle and Acromion Bone Region  Moderate depletion  Scapular Bone Region  Unable to assess  Dorsal Hand  Moderate depletion  Patellar Region  Moderate depletion  Anterior Thigh Region  Moderate depletion  Posterior Calf Region  Moderate depletion  Edema (RD Assessment)  Mild  Hair  Reviewed  Eyes  Unable to assess  Mouth  Unable to assess  Skin  Reviewed  Nails  Reviewed     Diet Order:   Diet Order           Diet NPO time specified Except for: Ice Chips, Sips with Meds  Diet effective now          EDUCATION NEEDS:   Not appropriate for education at this time  Skin:  Skin Assessment: Reviewed RN Assessment  Last BM:  03/11/2018 - smear  Height:   Ht Readings from Last 1 Encounters:  03/16/2018 5' 4"  (1.626 m)    Weight:   Wt Readings  from Last 1 Encounters:  03/07/2018 152 lb 1.9 oz (69 kg)    Ideal Body Weight:  54.5 kg  BMI:  Body mass index is 26.11 kg/m.  Estimated Nutritional Needs:   Kcal:  1465-1700 (MSJ x 1.3-1.5)  Protein:  80-95 grams (1.2-1.4 grams/kg)  Fluid:  1.5-1.7 L/day (1 mL/kcal)  Willey Blade, MS, RD, LDN Office: [phone] Pager: [phone] After Hours/Weekend Pager: [phone]

## 2018-03-13 NOTE — Progress Notes (Signed)
Patient having significant shortness of breath, audible wheezing, accessory muscle use, and tachypnea. Received verbal order for BiPAP from ICU NP.

## 2018-03-13 NOTE — Progress Notes (Signed)
Pt repeatedly stating, "Help me, help me, help me." When asked what she needs help with, pt pauses for a few seconds, then continues to state "help me" repeatedly. Calming music turned on, pt stopped saying help me. Will continue to monitor.

## 2018-03-13 NOTE — Progress Notes (Signed)
Pt in severe respiratory distress with agitation respiratory rate in the 30-40's, therefore placed pt on Bipap and precedex gtt started.  Attempted to notify pts daughters Adriana Reams and Matt Holmes to determine if pt were to further decline would they want pt mechanically intubated.  However, unable to get in contact with pts family left voicemail messages.  Will continue to monitor and assess pt.  Marda Stalker, [place] Pager [phone] (please enter 7 digits) PCCM Consult Pager [phone] (please enter 7 digits)

## 2018-03-13 NOTE — Progress Notes (Signed)
PT Cancellation Note  Patient Details Name: Vanessa [name] MRN: [id number] DOB: 10/18/1930   Cancelled Treatment:    Reason Eval/Treat Not Completed: Patient's level of consciousness.  PT consult received.  Chart reviewed.  Pt's nurse reporting pt very drowsy this morning (d/t pt's impaired level of arousal, pt not able to participate in PT evaluation).  Per discussion with nurse, will hold PT at this time and re-attempt PT evaluation at a later date/time.  Leitha Bleak, PT 03/13/18, 9:33 AM [phone]

## 2018-03-13 NOTE — Consult Note (Signed)
Consultation Note Date: 03/13/2018   Patient Name: Vanessa [name]  DOB: 1930-12-30  MRN: [id number]  Age / Sex: 84 y.o., female  PCP: Gwynneth Aliment Howell Rucks, MD Referring Physician: Fritzi Mandes, MD  Reason for Consultation: Establishing goals of care and Psychosocial/spiritual support  HPI/Patient Profile: 84 y.o. female  with past medical history of CVA, meningioma, seizure, thyroid disease, DM, colon cancer, and evidence of COPD on imaging who was admitted on 03/30/2018 with fever, encephalopathy and respiratory failure likely secondary to sepsis.  Work up revealed a WBC count in the upper 30s and acute kidney injury.  She required a brief intubation largely for altered mental status.   She has been extubated but remains on pressors with poor urine output.  An n/g tube has been placed for artificial feeding.    Clinical Assessment and Goals of Care:  I have reviewed medical records including EPIC notes, labs and imaging, received report from Dr. Alva Garnet and the bedside RN, assessed the patient and then spoke with her daughter Vanessa [name] on the phone to arrange a family meeting.  Vanessa [name] mentioned that she and her family had been discussing the patient overnight and had come to some decisions.  Vanessa [name] explained that they do not want their mother to be intubated ever again and they would not want her to have chest compressions.  However they would want her to have treatment should she decline including bipap if necessary.  We discussed the meaning of comfort care briefly - as the patient's husband has said "just make her comfortable".  The rest of the family has not reached that point yet.  Vanessa [name] mentioned that her brother will be arriving tonight from [place] and that she would like to schedule a meeting tomorrow in order to include everyone.  We decided on 11:30 am 6/13.    Primary Decision Maker:  NEXT OF KIN  Per the  patient, Vanessa [name] should be the primary point of contact for the family.  Legally if her husband is unable to make decisions, then the majority of her adult children as a group would be the surrogate decision maker.    SUMMARY OF RECOMMENDATIONS    Change code status to DNR/DNI - however the family would want treatment including pressors and bipap.  Family meeting scheduled for 6/13 at 11:30 am with husband and 4 children.  Code Status/Advance Care Planning:  DNR/DNI   Symptom Management:   Per primary team  Additional Recommendations (Limitations, Scope, Preferences):  Full Scope Treatment  Palliative Prophylaxis:   Delirium Protocol  Psycho-social/Spiritual:   Desire for further Chaplaincy support: unknown at this point.  Prognosis:  Pending goals of care, prognosis is likely weeks in the setting of severe sepsis, not taking PO, encephalopathy, tenuous respiratory status.    Discharge Planning: To Be Determined      Primary Diagnoses: Present on Admission: . Acute respiratory failure with hypoxia ([place])   I have reviewed the medical record, interviewed the patient and family, and examined the patient. The following aspects  are pertinent.  Past Medical History:  Diagnosis Date  . Colon cancer ([place])   . Diabetes mellitus without complication ([place])   . Stroke ([place])   . Thyroid disorder    Social History   Socioeconomic History  . Marital status: Married    Spouse name: Not on file  . Number of children: Not on file  . Years of education: Not on file  . Highest education level: Not on file  Occupational History  . Not on file  Social Needs  . Financial resource strain: Not on file  . Food insecurity:    Worry: Not on file    Inability: Not on file  . Transportation needs:    Medical: Not on file    Non-medical: Not on file  Tobacco Use  . Smoking status: Never Smoker  . Smokeless tobacco: Never Used  Substance and Sexual Activity  . Alcohol  use: No  . Drug use: No  . Sexual activity: Not on file  Lifestyle  . Physical activity:    Days per week: Not on file    Minutes per session: Not on file  . Stress: Not on file  Relationships  . Social connections:    Talks on phone: Not on file    Gets together: Not on file    Attends religious service: Not on file    Active member of club or organization: Not on file    Attends meetings of clubs or organizations: Not on file    Relationship status: Not on file  Other Topics Concern  . Not on file  Social History Narrative  . Not on file   Family History  Problem Relation Age of Onset  . Breast cancer Mother   . Heart attack Father   . Diabetes Brother    Scheduled Meds: . chlorhexidine  15 mL Mouth Rinse BID  . enoxaparin (LOVENOX) injection  30 mg Subcutaneous Q24H  . insulin aspart  0-15 Units Subcutaneous Q4H  . levothyroxine  37.5 mcg Intravenous Daily  . mouth rinse  15 mL Mouth Rinse BID  . mouth rinse  15 mL Mouth Rinse q12n4p  . sodium chloride flush  10-40 mL Intracatheter Q12H   Continuous Infusions: . lactated ringers Stopped (03/13/18 0211)  . small volume/piggyback builder 400 mL/hr at 03/12/18 1556  . meropenem (MERREM) IV Stopped (03/12/18 2132)  . phenylephrine (NEO-SYNEPHRINE) Adult infusion 150.133 mcg/min (03/13/18 0800)   PRN Meds:.acetaminophen **OR** acetaminophen, albuterol, metoprolol tartrate, morphine injection, [DISCONTINUED] ondansetron **OR** ondansetron (ZOFRAN) IV, sodium chloride flush, traZODone Allergies  Allergen Reactions  . Benadryl [Diphenhydramine Hcl] Other (See Comments)    Pt states that it makes her legs jump.   . Codeine Nausea And Vomiting  . Oxycodone Nausea And Vomiting and Other (See Comments)    "Makes me real irritable and I feel terrible."  . Penicillins Other (See Comments)    Pt states that it does not work for her.   Has patient had a PCN reaction causing immediate rash, facial/tongue/throat swelling, SOB or  lightheadedness with hypotension: No Has patient had a PCN reaction causing severe rash involving mucus membranes or skin necrosis: No Has patient had a PCN reaction that required hospitalization: No Has patient had a PCN reaction occurring within the last 10 years: No If all of the above answers are "NO", then may proceed with Cephalosporin use.    Review of Systems patient denies pain but is too weak and lethargic to provide an ROS  Physical Exam  Well developed elderly female, yelling out for water.  Will give appropriate 1 word answers to simple questions. CV rrr no frank m/r/g resp no distress on 3L n/c Abdomen slightly distended, nt  Vital Signs: BP 106/62 (BP Location: Left Arm)   Pulse 71   Temp (!) 95.9 F (35.5 C) (Bladder)   Resp 20   Ht 5\' 4"  (1.626 m)   Wt 69 kg (152 lb 1.9 oz)   SpO2 100%   BMI 26.11 kg/m  Pain Scale: PAINAD   Pain Score: Asleep   SpO2: SpO2: 100 % O2 Device:SpO2: 100 % O2 Flow Rate: .O2 Flow Rate (L/min): 3 L/min  IO: Intake/output summary:   Intake/Output Summary (Last 24 hours) at 03/13/2018 0852 Last data filed at 03/13/2018 0800 Gross per 24 hour  Intake 1954.66 ml  Output 430 ml  Net 1524.66 ml    LBM: Last BM Date: 03/11/18 Baseline Weight: Weight: 71.7 kg (158 lb) Most recent weight: Weight: 69 kg (152 lb 1.9 oz)     Palliative Assessment/Data: 20%     Time In: 12:20 Time Out:  1:00 Time Total: 40 min. Greater than 50%  of this time was spent counseling and coordinating care related to the above assessment and plan.  Signed by: Florentina Jenny, PA-C Palliative Medicine Pager: [phone]  Please contact Palliative Medicine Team phone at [phone] for questions and concerns.  For individual provider: See Shea Evans

## 2018-03-14 ENCOUNTER — Inpatient Hospital Stay: Payer: Medicare Other

## 2018-03-14 DIAGNOSIS — R410 Disorientation, unspecified: Secondary | ICD-10-CM

## 2018-03-14 DIAGNOSIS — Z515 Encounter for palliative care: Secondary | ICD-10-CM

## 2018-03-14 LAB — GLUCOSE, CAPILLARY
GLUCOSE-CAPILLARY: 212 mg/dL — AB (ref 65–99)
Glucose-Capillary: 197 mg/dL — ABNORMAL HIGH (ref 65–99)
Glucose-Capillary: 185 mg/dL — ABNORMAL HIGH (ref 65–99)

## 2018-03-14 LAB — BASIC METABOLIC PANEL
Anion gap: 8 (ref 5–15)
BUN: 60 mg/dL — ABNORMAL HIGH (ref 6–20)
CHLORIDE: 114 mmol/L — AB (ref 101–111)
CO2: 18 mmol/L — ABNORMAL LOW (ref 22–32)
CREATININE: 1.39 mg/dL — AB (ref 0.44–1.00)
Calcium: 8.6 mg/dL — ABNORMAL LOW (ref 8.9–10.3)
GFR calc non Af Amer: 34 mL/min — ABNORMAL LOW (ref >60)
GFR, EST AFRICAN AMERICAN: 39 mL/min — AB (ref >60)
Glucose, Bld: 229 mg/dL — ABNORMAL HIGH (ref 65–99)
POTASSIUM: 3.4 mmol/L — AB (ref 3.5–5.1)
SODIUM: 140 mmol/L (ref 135–145)

## 2018-03-14 LAB — URINE CULTURE: Culture: NO GROWTH

## 2018-03-14 LAB — CBC
HCT: 27.7 % — ABNORMAL LOW (ref 35.0–47.0)
Hemoglobin: 9.6 g/dL — ABNORMAL LOW (ref 12.0–16.0)
MCH: 32.4 pg (ref 26.0–34.0)
MCHC: 34.7 g/dL (ref 32.0–36.0)
MCV: 93.6 fL (ref 80.0–100.0)
PLATELETS: 262 10*3/uL (ref 150–440)
RBC: 2.96 MIL/uL — AB (ref 3.80–5.20)
RDW: 15.4 % — AB (ref 11.5–14.5)
WBC: 46.3 10*3/uL — AB (ref 3.6–11.0)

## 2018-03-14 LAB — PROCALCITONIN: PROCALCITONIN: 2.65 ng/mL

## 2018-03-14 MED ORDER — HALOPERIDOL LACTATE 2 MG/ML PO CONC
0.5000 mg | ORAL | Status: DC | PRN
  Filled 2018-03-14: qty 0.3

## 2018-03-14 MED ORDER — GLYCOPYRROLATE 0.2 MG/ML IJ SOLN
0.2000 mg | INTRAMUSCULAR | Status: DC | PRN
  Filled 2018-03-14: qty 1

## 2018-03-14 MED ORDER — LORAZEPAM 2 MG/ML IJ SOLN
0.5000 mg | Freq: Two times a day (BID) | INTRAMUSCULAR | Status: DC
  Administered 2018-03-14 (×2): 0.5 mg via INTRAVENOUS
  Filled 2018-03-14 (×2): qty 1

## 2018-03-14 MED ORDER — LORAZEPAM 2 MG/ML IJ SOLN: 1.0000 mg | INTRAMUSCULAR | Status: DC | PRN

## 2018-03-14 MED ORDER — ONDANSETRON HCL 4 MG/2ML IJ SOLN: 4.0000 mg | Freq: Four times a day (QID) | INTRAMUSCULAR | Status: DC | PRN

## 2018-03-14 MED ORDER — HALOPERIDOL 0.5 MG PO TABS
0.5000 mg | ORAL_TABLET | ORAL | Status: DC | PRN
  Filled 2018-03-14: qty 1

## 2018-03-14 MED ORDER — POLYVINYL ALCOHOL 1.4 % OP SOLN
1.0000 [drp] | Freq: Four times a day (QID) | OPHTHALMIC | Status: DC | PRN
  Filled 2018-03-14: qty 15

## 2018-03-14 MED ORDER — LORAZEPAM 1 MG PO TABS: 1.0000 mg | ORAL_TABLET | ORAL | Status: DC | PRN

## 2018-03-14 MED ORDER — HALOPERIDOL LACTATE 5 MG/ML IJ SOLN: 0.5000 mg | INTRAMUSCULAR | Status: DC | PRN

## 2018-03-14 MED ORDER — MORPHINE 100MG IN NS 100ML (1MG/ML) PREMIX INFUSION
2.0000 mg/h | INTRAVENOUS | Status: DC
  Administered 2018-03-14 – 2018-03-15 (×2): 4 mg/h via INTRAVENOUS
  Filled 2018-03-14 (×2): qty 100

## 2018-03-14 MED ORDER — BIOTENE DRY MOUTH MT LIQD: 15.0000 mL | OROMUCOSAL | Status: DC | PRN

## 2018-03-14 MED ORDER — LORAZEPAM 2 MG/ML PO CONC: 1.0000 mg | ORAL | Status: DC | PRN

## 2018-03-14 MED ORDER — ONDANSETRON 4 MG PO TBDP
4.0000 mg | ORAL_TABLET | Freq: Four times a day (QID) | ORAL | Status: DC | PRN
  Filled 2018-03-14: qty 1

## 2018-03-14 MED ORDER — MORPHINE SULFATE (PF) 2 MG/ML IV SOLN
2.0000 mg | Freq: Once | INTRAVENOUS | Status: AC
  Administered 2018-03-14: 2 mg via INTRAVENOUS
  Filled 2018-03-14: qty 1

## 2018-03-14 MED ORDER — GLYCOPYRROLATE 1 MG PO TABS
1.0000 mg | ORAL_TABLET | ORAL | Status: DC | PRN
  Filled 2018-03-14: qty 1

## 2018-03-14 MED ORDER — GLYCOPYRROLATE 0.2 MG/ML IJ SOLN
0.4000 mg | Freq: Two times a day (BID) | INTRAMUSCULAR | Status: DC
  Administered 2018-03-14 (×2): 0.4 mg via INTRAVENOUS
  Filled 2018-03-14 (×4): qty 2

## 2018-03-14 MED ORDER — MORPHINE BOLUS VIA INFUSION
2.0000 mg | INTRAVENOUS | Status: DC | PRN
  Filled 2018-03-14: qty 4

## 2018-03-14 MED ORDER — POTASSIUM CHLORIDE 10 MEQ/50ML IV SOLN
10.0000 meq | INTRAVENOUS | Status: AC
  Administered 2018-03-14 (×2): 10 meq via INTRAVENOUS
  Filled 2018-03-14 (×2): qty 50

## 2018-03-14 NOTE — Progress Notes (Signed)
Nutrition Brief Note  Chart reviewed and discussed patient on rounds. Patient now transitioning to comfort care.  No further nutrition interventions warranted at this time. Please re-consult RD as needed.  Willey Blade, MS, [place], LDN Office: [phone] Pager: [phone] After Hours/Weekend Pager: [phone]

## 2018-03-14 NOTE — Progress Notes (Signed)
[facility] [place] NAME: Vanessa [name]    MR#:  [id number]  DATE OF BIRTH:  Aug 09, 1931  SUBJECTIVE:  She is breathing a little hard. Tachycardic with heart rate in the 120s NG placed Placed on BIPAP REVIEW OF SYSTEMS:   Review of Systems  Unable to perform ROS: Medical condition   Tolerating Diet: NPO pending speech eval Tolerating PT: pending  DRUG ALLERGIES:   Allergies  Allergen Reactions  . Benadryl [Diphenhydramine Hcl] Other (See Comments)    Pt states that it makes her legs jump.   . Codeine Nausea And Vomiting  . Oxycodone Nausea And Vomiting and Other (See Comments)    "Makes me real irritable and I feel terrible."  . Penicillins Other (See Comments)    Pt states that it does not work for her.   Has patient had a PCN reaction causing immediate rash, facial/tongue/throat swelling, SOB or lightheadedness with hypotension: No Has patient had a PCN reaction causing severe rash involving mucus membranes or skin necrosis: No Has patient had a PCN reaction that required hospitalization: No Has patient had a PCN reaction occurring within the last 10 years: No If all of the above answers are "NO", then may proceed with Cephalosporin use.     VITALS:  Blood pressure (!) 94/59, pulse (!) 122, temperature 99 F (37.2 C), resp. rate (!) 29, height 5\' 4"  (1.626 m), weight 69.2 kg (152 lb 8.9 oz), SpO2 100 %.  PHYSICAL EXAMINATION:   Physical Exam  GENERAL:  84 y.o.-year-old patient lying in the bed with mild to modrate acute distress. Critically ill EYES: Pupils equal, round, reactive to light and accommodation. No scleral icterus. Extraocular muscles intact.  HEENT: Head atraumatic, normocephalic. Oropharynx and nasopharynx clear. NG+ NECK:  Supple, no jugular venous distention. No thyroid enlargement, no tenderness.  LUNGS: Normal breath sounds bilaterally, no wheezing, rales, rhonchi. No use of accessory muscles of  respiration.  CARDIOVASCULAR: S1, S2 normal. No murmurs, rubs, or gallops. tachycardia ABDOMEN: Soft, nontender, nondistended. Bowel sounds present. No organomegaly or mass.  EXTREMITIES: No cyanosis, clubbing or edema b/l.    NEUROLOGIC: limited assessment due to medical condition. Moves all extremities well. PSYCHIATRIC:  patient is alert and oriented x 2.  SKIN: No obvious rash, lesion, or ulcer.   LABORATORY PANEL:  CBC Recent Labs  Lab 03/14/18 0510  WBC 46.3*  HGB 9.6*  HCT 27.7*  PLT 262    Chemistries  Recent Labs  Lab 03/04/2018 1334 03/11/18 0316  03/14/18 0510  NA 135 133*   < > 140  K 4.0 4.0   < > 3.4*  CL 104 103   < > 114*  CO2 22 21*   < > 18*  GLUCOSE 196* 322*   < > 229*  BUN 38* 47*   < > 60*  CREATININE 1.35* 1.54*   < > 1.39*  CALCIUM 8.3* 8.3*   < > 8.6*  MG  --  2.3  --   --   AST 57*  --   --   --   ALT 14  --   --   --   ALKPHOS 118  --   --   --   BILITOT 2.4*  --   --   --    < > = values in this interval not displayed.   Cardiac Enzymes Recent Labs  Lab 03/31/2018 1334  TROPONINI <0.03   RADIOLOGY:  Dg Abd  1 View  Result Date: 03/13/2018 CLINICAL DATA:  Check nasogastric catheter EXAM: ABDOMEN - 1 VIEW COMPARISON:  03/11/2018 FINDINGS: Scattered large and small bowel gas is noted. Nasogastric catheter is noted extending into the stomach. Proximal side port lies at the gastroesophageal junction. This could be advanced several cm. No other focal abnormality is noted. IMPRESSION: Stable appearance of nasogastric catheter the proximal side port lies at the gastroesophageal junction. The tube could be advanced several cm as necessary. Electronically Signed   By: [name] M.D.   On: 03/13/2018 10:52   Dg Chest Port 1 View  Result Date: 03/14/2018 CLINICAL DATA:  Respiratory failure. EXAM: PORTABLE CHEST 1 VIEW COMPARISON:  03/13/2018. FINDINGS: Right PICC line noted with tip over superior vena cava. NG tube noted with tip below left  hemidiaphragm. Heart size normal. Progressive left lower lobe atelectasis/consolidation and small left pleural effusion. No pneumothorax. IMPRESSION: 1. Right PICC line noted with tip over superior vena cava. NG tube noted with tip below left hemidiaphragm. 2. Left lower lobe atelectasis/consolidation. Small left pleural effusion. These findings have progressed from prior exam. Electronically Signed   By: [place]   On: 03/14/2018 07:03   Dg Chest Port 1 View  Result Date: 03/13/2018 CLINICAL DATA:  Acute respiratory distress. EXAM: PORTABLE CHEST 1 VIEW COMPARISON:  Radiograph 03/17/2018 FINDINGS: Endotracheal and enteric tubes have been removed. Right upper extremity PICC with tip in the mid SVC. Lower lung volumes from prior exam with bronchovascular crowding. Heart size is normal for technique with aortic atherosclerosis. Probable retrocardiac atelectasis. Biapical pleuroparenchymal scarring. No large pleural effusion or pneumothorax. Remote left rib fractures. IMPRESSION: 1. Low lung volumes with left lung base atelectasis. 2. Right upper extremity PICC with tip in the SVC. Electronically Signed   By: Jeb Levering M.D.   On: 03/13/2018 02:06   ASSESSMENT AND PLAN:   Vanessa [name]  is a 84 y.o. female with a recent emergency room visit on 03/04/2018 and treated for a urinary tract infection with Keflex.  The patient was brought in via EMS and found to have a temperature of 102.9 respirations in the 40s and obtunded.  1.  Clinical sepsis, acute metabolic encephalopathy and relative hypotension -clear etiology not able to establish ?UTI ?LLL pNA -  Lactic acidosis, elevated Pro calcitonin -Patient had a recent diagnosis of urinary tract infection. -  Fever, leukocytosis and tachycardia.  - Sepsis protocol and aggressive antibiotics .  -on vanc and meropenem---descalate abxs -Urine culture negative BC 1/2 staph species -pt now extubated---but requiring BIPAP -count elevated to  46,000-35K-- 46 K -on IV pressors   2.  History of stroke - we will switch aspirin to rectal for right now   3.  History of seizure and meningioma on Keppra.   -IV Keppra  4.  Hypothyroidism unspecified on levothyroxine.    5.  Type 2 diabetes mellitus.  We will put on sliding scale  Patient has a poor prognosis. She is now DNR  Case discussed with Care Management/Social Worker.  CODE STATUSDNR  DVT Prophylaxis: heparin  TOTAL TIME TAKING CARE OF THIS PATIENT: *30* minutes.  >50% time spent on counselling and coordination of care  POSSIBLE D/C IN *1-2 DAYS, DEPENDING ON CLINICAL CONDITION.  Note: This dictation was prepared with Dragon dictation along with smaller phrase technology. Any transcriptional errors that result from this process are unintentional.  Fritzi Mandes M.D on 03/14/2018 at 3:39 PM  Between 7am to 6pm - Pager - [phone]  After  6pm go to [email or web address] - password [email or web address] [facility]  Office  [phone]  CC: Primary care physician; Herminio Commons, MDPatient ID: Myrtie Neither, female   DOB: 1930/12/18, 84 y.o.   MRN: [id number]

## 2018-03-14 NOTE — Progress Notes (Signed)
PT Cancellation Note  Patient Details Name: Vanessa [name] MRN: [id number] DOB: Feb 08, 1931   Cancelled Treatment:    Reason Eval/Treat Not Completed: Patient not medically ready.  PT consult received.  Chart reviewed.  Pt currently on BiPAP (not appropriate for PT at this time).  Will hold PT at this time and re-attempt PT evaluation at a later date/time as medically appropriate.  Leitha Bleak, PT 03/14/18, 8:33 AM [phone]

## 2018-03-14 NOTE — Progress Notes (Signed)
Pharmacy Antibiotic Note  Vanessa [name] is a 84 y.o. female admitted on 03/24/2018 with pneumonia.  Pharmacy has been consulted for Cefepime and vanco dosing.  6/10 Cefepime now discontinued. Pharmacy now consulted for meropenem dosing for PMH of ESBL infection.   6/12 Pharmacy consulted to restart vancomycin   Plan:  1. Continue Meropenem 1g IV every 12 hours backed on current CrCl <88ml.min.   2.Continue Vancomycin 750mg  IV every 24 hours. Calculated trough at C[facility] is 15.4. Will draw trough prior to 3rd dose. Trough ordered for 6/14 @ 1730.   ke 0.029, t1/2 23.90, Vd 48.3  Will monitor renal function and adjust dose as needed.    Height: 5\' 4"  (162.6 cm) Weight: 152 lb 8.9 oz (69.2 kg) IBW/kg (Calculated) : 54.7  Temp (24hrs), Avg:98.6 F (37 C), Min:96.4 F (35.8 C), Max:100.6 F (38.1 C)  Recent Labs  Lab 03/25/2018 1334 03/17/2018 1645 03/11/18 0316 03/12/18 0444 03/13/18 0341 03/14/18 0510  WBC 34.2*  --  33.9* 46.7* 35.5* 46.3*  CREATININE 1.35*  --  1.54* 1.64* 1.38* 1.39*  LATICACIDVEN 2.0* 1.8  --   --   --   --     Estimated Creatinine Clearance: 28.3 mL/min (A) (by C-G formula based on SCr of 1.39 mg/dL (H)).    Allergies  Allergen Reactions  . Benadryl [Diphenhydramine Hcl] Other (See Comments)    Pt states that it makes her legs jump.   . Codeine Nausea And Vomiting  . Oxycodone Nausea And Vomiting and Other (See Comments)    "Makes me real irritable and I feel terrible."  . Penicillins Other (See Comments)    Pt states that it does not work for her.   Has patient had a PCN reaction causing immediate rash, facial/tongue/throat swelling, SOB or lightheadedness with hypotension: No Has patient had a PCN reaction causing severe rash involving mucus membranes or skin necrosis: No Has patient had a PCN reaction that required hospitalization: No Has patient had a PCN reaction occurring within the last 10 years: No If all of the above answers are "NO", then may  proceed with Cephalosporin use.     Antimicrobials this admission: 6/9 Vanco >> 6/10 6/9 Cefepime >>  6/10 6/10 meropenem>>  6/12 Vancomycin>>   Dose adjustments this admission:   Microbiology results: 6/9 BCx: Coag (-) Staph (1 of 4)  6/9  BCID Staph species  6/9 UCx: NG Final  6/9 MRSA PCR: negative   Thank you for allowing pharmacy to be a part of this patient's care.  Pernell Dupre, PharmD, BCPS Clinical Pharmacist 03/14/2018 11:14 AM

## 2018-03-14 NOTE — Progress Notes (Signed)
PT PROFILE: 45F PMH of colon cancer, diabetes, hypothyroidism, CVA, presented to the ED 6/09 via EMS with SOB, fever. H/O ESBL UTI. Urine was reportedly foul. In ED, her respiratory status deteriorated and she required intubation.    SUBJ Much more uncomfortable this morning.  She is on BiPAP but remains very tachypneic.  She has now received morphine to relieve her discomfort.  Family conference planned for later today.  Vitals:   03/14/18 1000 03/14/18 1045 03/14/18 1100 03/14/18 1120  BP: 115/63 129/63 (!) 109/58   Pulse: (!) 107 (!) 110 (!) 109 (!) 104  Resp: (!) 27 (!) 23 (!) 28 (!) 29  Temp: 99.5 F (37.5 C) 99.5 F (37.5 C) 99.3 F (37.4 C) 99.3 F (37.4 C)  TempSrc:      SpO2: 100% 100% 100% 100%  Weight:      Height:      2LPM [place] O2  RASS -1, not following commands HEENT NCAT, sclerae white No wheezes anteriorly Tachycardia, regular, no M Abdomen soft, NT, diminished BS Extremities warm, symmetric pretibial edema LUE contractures, NSC  BMP Latest Ref Rng & Units 03/14/2018 03/13/2018 03/12/2018  Glucose 65 - 99 mg/dL 229(H) 111(H) 161(H)  BUN 6 - 20 mg/dL 60(H) 52(H) 56(H)  Creatinine 0.44 - 1.00 mg/dL 1.39(H) 1.38(H) 1.64(H)  Sodium 135 - 145 mmol/L 140 142 138  Potassium 3.5 - 5.1 mmol/L 3.4(L) 3.1(L) 3.6  Chloride 101 - 111 mmol/L 114(H) 116(H) 109  CO2 22 - 32 mmol/L 18(L) 18(L) 19(L)  Calcium 8.9 - 10.3 mg/dL 8.6(L) 7.7(L) 8.5(L)   CBC Latest Ref Rng & Units 03/14/2018 03/13/2018 03/12/2018  WBC 3.6 - 11.0 K/uL 46.3(H) 35.5(H) 46.7(H)  Hemoglobin 12.0 - 16.0 g/dL 9.6(L) 9.0(L) 9.9(L)  Hematocrit 35.0 - 47.0 % 27.7(L) 25.4(L) 28.0(L)  Platelets 150 - 440 K/uL 262 284 329   CXR: LLL atelectasis versus infiltrate with probable small pleural effusion  IMPRESSION: Acute respiratory failure with hypoxemia Hypotension, suspect septic shock Tachyarrhythmias (PAT, frequent PACs) Oliguric AKI, not a candidate for hemodialysis Acute encephalopathy/delirium History  of ESBL UTI Elevated PCT Probable LLL pneumonia Severe sepsis  PLAN/REC: Continue supplemental oxygen to maintain SPO2 >90% Continue BiPAP as needed Continue phenylephrine to maintain MAP >65 mmHg Continue very low-dose metoprolol to maintain HR <115/min Cont meropenem initiated 6/10 Continue vancomycin, initiated 6/12 Palliative care service assistance much appreciated.  Family conference planned for later today with possible transition to full comfort care Morphine as needed for any form of discomfort (pain or dyspnea)  FAMILY:   Merton Border, MD PCCM service Mobile [phone] Pager [phone] 03/14/2018 12:00 PM
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
## 2018-03-14 NOTE — Progress Notes (Signed)
Patient transported to room 128 via hospital bed on O2 @ 2 lpm and morphine gtt at 4 mg/hr by this RN. Malka, RN notified that by me that the patient was in the room with the family at the [facility].

## 2018-03-14 NOTE — Progress Notes (Addendum)
Daily Progress Note   Patient Name: Vanessa [name]       Date: 03/14/2018 DOB: 11/03/1930  Age: 84 y.o. MRN#: [id number] Attending Physician: Fritzi Mandes, MD Primary Care Physician: Herminio Commons, MD Admit Date: 03/30/2018  Reason for Consultation/Follow-up: Establishing goals of care, Non pain symptom management and Psychosocial/spiritual support  Subjective: Patient on bipap and lethargic.  Met with husband and 4 children Joelene Millin, Joeseph Amor and 1 more) in [place].  We discussed her life at home, her leg fracture this past winter and her subsequent decline.  Her family feels she is willing herself to die.  She confessed privately to her daughter Joelene Millin) that if it was not a sin she would commit suicide.  Much of the meeting was spent attempting to explain to the patient's husband that she was not going to get better, and she was not going to come home again.  Mr. Rosekrans expressed strong respect and trust for Dr. Clayborn Bigness (the couple's cardiologist).  He asked that Dr. Clayborn Bigness be called to offer an opinion.  "I just need to feel that we have done everything we can do".  The four children expressed that they understood their mother's position and that they wanted to move forward with making her comfort and taking the Bipap mask off of her face.  After our meeting I spoke with Drs. Admire.  Dr. Clayborn Bigness graciously offered to see the patient.  This gave the patient's husband peace.    Assessment: 84 yo female who appears to be actively dying.  Unable to be liberated from bipap.  Encephalopathic.  Appears to have on-going respiratory failure, LLL PNA, possible coag negative staff bacteremia (on merropenem and vancomycin), WBC of 46k (not on steroids), acute on chronic  kidney disease with poor urine output, slightly enlarging meningioma.   Patient Profile/HPI:  84 y.o. female  with past medical history of CVA, meningioma, seizure, thyroid disease, DM, colon cancer, and evidence of COPD on imaging who was admitted on 03/19/2018 with fever, encephalopathy and respiratory failure likely secondary to sepsis.  Work up revealed a WBC count in the upper 30s and acute kidney injury.  She required a brief intubation largely for altered mental status.   She has been extubated but remains on pressors with poor urine output.  An n/g tube  has been placed for artificial feeding.   Length of Stay: 4  Current Medications: Scheduled Meds:  . chlorhexidine  15 mL Mouth Rinse BID  . enoxaparin (LOVENOX) injection  30 mg Subcutaneous Q24H  . feeding supplement (OSMOLITE 1.5 CAL)  1,000 mL Per Tube Q24H  . feeding supplement (PRO-STAT SUGAR FREE 64)  30 mL Per Tube BID  . free water  100 mL Per Tube Q8H  . insulin aspart  0-15 Units Subcutaneous Q4H  . levothyroxine  75 mcg Oral QAC breakfast  . mouth rinse  15 mL Mouth Rinse q12n4p  . multivitamin  15 mL Per Tube Daily  . sodium chloride flush  10-40 mL Intracatheter Q12H    Continuous Infusions: . small volume/piggyback builder 400 mL/hr at 03/13/18 1546  . meropenem (MERREM) IV Stopped (03/13/18 2152)  . phenylephrine (NEO-SYNEPHRINE) Adult infusion Stopped (03/13/18 1548)  . vancomycin Stopped (03/13/18 1812)    PRN Meds: acetaminophen **OR** acetaminophen, albuterol, metoprolol tartrate, morphine injection, [DISCONTINUED] ondansetron **OR** ondansetron (ZOFRAN) IV, sodium chloride flush  Physical Exam        Well developed elderly female, sleeping on Bipap CV tachycardic Resp NAD on bipap Abdomen soft, nt LE without edema  Vital Signs: BP 118/66   Pulse (!) 104   Temp 99.1 F (37.3 C)   Resp (!) 23   Ht 5' 4"  (1.626 m)   Wt 69.2 kg (152 lb 8.9 oz)   SpO2 100%   BMI 26.19 kg/m  SpO2: SpO2: 100 % O2  Device: O2 Device: Bi-PAP O2 Flow Rate: O2 Flow Rate (L/min): 3 L/min  Intake/output summary:   Intake/Output Summary (Last 24 hours) at 03/14/2018 1235 Last data filed at 03/14/2018 1200 Gross per 24 hour  Intake 1741.33 ml  Output 60 ml  Net 1681.33 ml   LBM: Last BM Date: 03/11/18 Baseline Weight: Weight: 71.7 kg (158 lb) Most recent weight: Weight: 69.2 kg (152 lb 8.9 oz)       Palliative Assessment/Data: 10%   Flowsheet Rows     Most Recent Value  Intake Tab  Referral Department  Hospitalist  Unit at Time of Referral  Med/Surg Unit  Date Notified  03/13/18  Palliative Care Type  New Palliative care  Reason for referral  Clarify Goals of Care  Date of Admission  03/27/2018  Date first seen by Palliative Care  03/13/18  # of days Palliative referral response time  0 Day(s)  # of days IP prior to Palliative referral  3  Clinical Assessment  Psychosocial & Spiritual Assessment  Palliative Care Outcomes      Patient Active Problem List   Diagnosis Date Noted  . Malnutrition of moderate degree 03/13/2018  . AKI (acute kidney injury) ([place])   . Respiratory distress   . Sepsis ([place])   . Acute respiratory failure with hypoxia ([place]) 03/08/2018  . 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 Closed tibia fracture 08/14/2017  . TIA (transient ischemic attack) 06/23/2016  . Absolute anemia 05/30/2015  . B12 deficiency 05/30/2015  . Cerebral vascular accident ([place]) 05/30/2015  . Colon cancer ([place]) 05/30/2015  . Diabetes mellitus, type 2 ([place]) 05/30/2015  . HLD (hyperlipidemia) 05/30/2015  . Heart attack ([place]) 05/30/2015  . Adult hypothyroidism 05/30/2015  . Avitaminosis D 05/30/2015  . Enthesopathy of hip 01/05/2014  . Temporary cerebral vascular dysfunction 03/02/2012    Palliative Care Plan    Recommendations/Plan:  Move forward with transitioning to comfort care and removing  Bipap  Family aware that she will likely not leave  the hospital and will pass away quickly  PMT appreciates Dr. Etta Quill support of the family.  Family is very grateful for the treatment their mother has received in the ICU.  Goals of Care and Additional Recommendations:  Limitations on Scope of Treatment: Full Comfort Care  Code Status:  DNR  Prognosis:   Hours - Days secondary to respiratory failure.   Discharge Planning:  Anticipated Hospital Death  Care plan was discussed with Dr. Alva Garnet, Dr. Clayborn Bigness, Palliative Team, Patient's family, bedside RN  Thank you for allowing the Palliative Medicine Team to assist in the care of this patient.  Total time spent:  11:30 am - 1:00 pm, 90 min.     Greater than 50%  of this time was spent counseling and coordinating care related to the above assessment and plan.  Florentina Jenny, PA-C Palliative Medicine  Please contact Palliative MedicineTeam phone at [phone] for questions and concerns between 7 am - 7 pm.   Please see AMION for individual provider pager numbers.

## 2018-03-14 NOTE — Consult Note (Signed)
Asked by family to render an opinion about prognosis After reviewing chart discussing the case with primary physician as well as critical care and examined the patient  Impression Respiratory failure with hypoxemia Severe sepsis UTI with possible sepsis ESBL Atrial fibrillation Elevated white count Hypotension requiring pressors Renal insufficiency Probable left lower lobe pneumonia Acute encephalopathy delirium . Patient was unable to answer any questions did not respond was able to open her eyes is not clear if she was aware well-recognized me It is my impression that this patient has a very poor prognosis after extensive hospitalization and critical care therapy with respiratory support Patient is currently on broad-spectrum antibiotic therapy supplemental oxygen pressure therapy I strongly recommend comfort measures or hospice. Though there is still a chance patient may recover it appears to me that the probability is very minimal Strongly recommend palliative care and consider comfort measures  Thank You, Yolonda Kida MD

## 2018-03-14 NOTE — Progress Notes (Signed)
PT Cancellation Note  Patient Details Name: DARENDA FIKE MRN: [id number] DOB: 14-Aug-1931   Cancelled Treatment:    Reason Eval/Treat Not Completed: Other (comment).  PT consult cancelled.  Per chart, transitioning to comfort care.  Leitha Bleak, PT 03/14/18, 2:11 PM [phone]

## 2018-03-14 NOTE — Progress Notes (Signed)
Inpatient Diabetes Program Recommendations  AACE/ADA: New Consensus Statement on Inpatient Glycemic Control (2019)  Target Ranges:  Prepandial:   less than 140 mg/dL      Peak postprandial:   less than 180 mg/dL (1-2 hours)      Critically ill patients:  140 - 180 mg/dL   Results for KIFFANY, SCHELLING (MRN [id number]) as of 03/14/2018 08:53  Ref. Range 03/13/2018 03:26 03/13/2018 07:29 03/13/2018 11:44 03/13/2018 15:29 03/13/2018 20:14 03/13/2018 23:25 03/14/2018 03:26 03/14/2018 07:29  Glucose-Capillary Latest Ref Range: 65 - 99 mg/dL 108 (H) 112 (H) 167 (H) 183 (H) 249 (H) 252 (H) 197 (H) 185 (H)   Review of Glycemic Control  Current orders for Inpatient glycemic control: Novolog 0-15 units Q4H  Inpatient Diabetes Program Recommendations:  Insulin - Tube Feeding Coverage: If appropriate for patient, please consider ordering Novolog 2 units Q4H for tube feeding coverage. If tube feeding is stopped or held then Novolog tube feeding coverage should also be stopped or held.  Thanks, Barnie Alderman, RN, MSN, CDE Diabetes Coordinator Inpatient Diabetes Program [phone] (Team Pager from 8am to 5pm)

## 2018-03-15 LAB — CULTURE, BLOOD (ROUTINE X 2)
CULTURE: NO GROWTH
SPECIAL REQUESTS: ADEQUATE

## 2018-03-16 LAB — BLOOD GAS, VENOUS
Acid-Base Excess: 0.1 mmol/L (ref 0.0–2.0)
BICARBONATE: 23.9 mmol/L (ref 20.0–28.0)
FIO2: 0.21
PCO2 VEN: 36 mmHg — AB (ref 44.0–60.0)
Patient temperature: 37
pH, Ven: 7.43 (ref 7.250–7.430)

## 2018-03-18 LAB — CULTURE, BLOOD (ROUTINE X 2)
CULTURE: NO GROWTH
Culture: NO GROWTH
Special Requests: ADEQUATE
Special Requests: ADEQUATE

## 2018-04-01 NOTE — Death Summary Note (Signed)
DEATH SUMMARY   Patient Details  Name: Vanessa [name] MRN: [id number] DOB: Feb 25, 1931  Admission/Discharge Information   Admit Date:  03-16-2018  Date of Death: Date of Death: Mar 21, 2018  Time of Death: Time of Death: 0820  Length of Stay: 5  Referring Physician: Herminio Commons, MD   Reason(s) for Hospitalization   Pt admitted with shortness of breath Diagnoses  Preliminary cause of death:   Sepsis  Secondary Diagnoses (including complications and co-morbidities):  Active Problems:   Acute respiratory failure with hypoxia (HCC)   Malnutrition of moderate degree   AKI (acute kidney injury) ([place])   Respiratory distress   Sepsis [facility])   Palliative care encounter   Brief Hospital Course (including significant findings, care, treatment, and services provided and events leading to death)   JeanWilsonis a84 y.o.femalewith a recent emergency room visit on 03/04/2018 and treated for a urinary tract infection with Keflex. The patient was brought in via EMS and found to have a temperature of 102.9 respirations in the 40s and obtunded.   Clinical sepsis,acute metabolic encephalopathy and relative hypotension -clear etiology not able to establish ?UTI ?LLL pNA - Lactic acidosis, elevated Pro calcitonin -Patient had a recent diagnosis of urinary tract infection.she cont withFever, leukocytosis and tachycardia. Received broad spectrum antibiotics with vanc and meropenem-pt was als on pressors patient was continued on BiPAP. She continued to decline given her multiple comorbidities. Family discussion was held with palliative care medicine. Family decided for patient to be comfort care after she had multiple comorbidities and multiorgan failure. She was moved down from the ICU. She died  on his 14 2019.   Pertinent Labs and Studies  Significant Diagnostic Studies Dg Abd 1 View  Result Date: 03/13/2018 CLINICAL DATA:  Check nasogastric catheter EXAM: ABDOMEN - 1 VIEW  COMPARISON:  03/11/2018 FINDINGS: Scattered large and small bowel gas is noted. Nasogastric catheter is noted extending into the stomach. Proximal side port lies at the gastroesophageal junction. This could be advanced several cm. No other focal abnormality is noted. IMPRESSION: Stable appearance of nasogastric catheter the proximal side port lies at the gastroesophageal junction. The tube could be advanced several cm as necessary. Electronically Signed   By: [name] M.D.   On: 03/13/2018 10:52   Dg Abd 1 View  Result Date: 03/11/2018 CLINICAL DATA:  OG tube placement. EXAM: ABDOMEN - 1 VIEW COMPARISON:  16-Mar-2018 FINDINGS: The enteric tube tip projects over the expected location of the proximal body of stomach. The side port is at or just below the level of the GE junction. Air-filled loops of large bowel are again noted. IMPRESSION: 1. OG tube tip overlies the expected location of the proximal body of stomach. Electronically Signed   By: Kerby Moors M.D.   On: 03/11/2018 08:59   Dg Abdomen 1 View  Result Date: 2018-03-16 CLINICAL DATA:  Orogastric tube placement. EXAM: ABDOMEN - 1 VIEW COMPARISON:  None. FINDINGS: Orogastric tube tip is seen overlying the mid to distal portion of the stomach. IMPRESSION: Orogastric tube tip overlies the mid to distal stomach. Electronically Signed   By: Earle Gell M.D.   On: 16-Mar-2018 14:35   Mr Brain Wo Contrast  Result Date: 03/04/2018 CLINICAL DATA:  84 year old female with progressive generalized weakness for 3 days. Known left occipital meningioma. EXAM: MRI HEAD WITHOUT CONTRAST TECHNIQUE: Multiplanar, multiecho pulse sequences of the brain and surrounding structures were obtained without intravenous contrast. COMPARISON:  Brain MRI 08/21/2017 and earlier. FINDINGS: Brain: Chronic lacunar infarct  in the right corona radiata and basal ganglia. Stable cerebral volume. No restricted diffusion to suggest acute infarction. No midline shift, ventriculomegaly,  extra-axial fluid collection or acute intracranial hemorrhage. Cervicomedullary junction and pituitary are within normal limits. Rounded 20 centimeter medial left occipital lobe meningioma measuring 20-22 millimeters diameter appears unchanged. Mild regional mass effect and mild adjacent vasogenic edema in the left occipital pole appears stable (series 9, images 15-18). No new signal abnormality identified. Vascular: Major intracranial vascular flow voids are stable with mild generalized intracranial artery dolichoectasia. Skull and upper cervical spine: Stable visible cervical spine. Normal bone marrow signal. Sinuses/Orbits: Stable and negative. Other: Trace retained secretions layering in the nasopharynx today (series 8, image 5). Mastoid air cells remain clear. Visible internal auditory structures appear stable and negative. Stable scalp and face soft tissues. IMPRESSION: 1.  No acute intracranial abnormality. 2. Stable noncontrast MRI appearance the brain since 2018. Chronic lacunar type infarct of the right corona radiata/lentiform and chronic left occipital lobe meningioma with mild regional edema. Electronically Signed   By: Genevie Ann M.D.   On: 03/04/2018 16:45   US Renal  Result Date: 03/11/2018 CLINICAL DATA:  84 year old female with a history of acute kidney injury EXAM: RENAL / URINARY TRACT ULTRASOUND COMPLETE COMPARISON:  None. FINDINGS: Right Kidney: Length: 10.6 cm. Cortical thinning of the right kidney. No hydronephrosis. No focal lesion. Flow confirmed in the hilum of the right kidney. Left Kidney: Length: 11.4 cm. Cortical thinning of the left kidney. No hydronephrosis. No focal lesion. Flow confirmed in the hilum. Bladder: Appears normal for degree of bladder distention. IMPRESSION: No evidence of hydronephrosis of the left or right kidney. Bilateral renal cortical thinning, suggesting chronic medical renal disease. Electronically Signed   By: Corrie Mckusick D.O.   On: 03/11/2018 13:43   Dg  Chest Port 1 View  Result Date: 03/14/2018 CLINICAL DATA:  Respiratory failure. EXAM: PORTABLE CHEST 1 VIEW COMPARISON:  03/13/2018. FINDINGS: Right PICC line noted with tip over superior vena cava. NG tube noted with tip below left hemidiaphragm. Heart size normal. Progressive left lower lobe atelectasis/consolidation and small left pleural effusion. No pneumothorax. IMPRESSION: 1. Right PICC line noted with tip over superior vena cava. NG tube noted with tip below left hemidiaphragm. 2. Left lower lobe atelectasis/consolidation. Small left pleural effusion. These findings have progressed from prior exam. Electronically Signed   By: [place]   On: 03/14/2018 07:03   Dg Chest Port 1 View  Result Date: 03/13/2018 CLINICAL DATA:  Acute respiratory distress. EXAM: PORTABLE CHEST 1 VIEW COMPARISON:  Radiograph 03/21/2018 FINDINGS: Endotracheal and enteric tubes have been removed. Right upper extremity PICC with tip in the mid SVC. Lower lung volumes from prior exam with bronchovascular crowding. Heart size is normal for technique with aortic atherosclerosis. Probable retrocardiac atelectasis. Biapical pleuroparenchymal scarring. No large pleural effusion or pneumothorax. Remote left rib fractures. IMPRESSION: 1. Low lung volumes with left lung base atelectasis. 2. Right upper extremity PICC with tip in the SVC. Electronically Signed   By: Jeb Levering M.D.   On: 03/13/2018 02:06   Dg Chest Port 1 View  Result Date: 03/26/2018 CLINICAL DATA:  Acute respiratory failure with hypoxia. Sepsis. Endotracheal tube placement. EXAM: PORTABLE CHEST 1 VIEW COMPARISON:  03/04/2018 FINDINGS: Endotracheal tube is seen in place with tip approximately 3 cm above the carina. Orogastric tube is seen entering the stomach. Pulmonary hyperinflation again seen, consistent with COPD. Both lungs are clear. Heart size is normal. Aortic atherosclerosis. Several old  left rib fracture deformities are again seen. IMPRESSION:  Endotracheal tube and orogastric tube in appropriate position. COPD.  No active lung disease. Electronically Signed   By: Earle Gell M.D.   On: 03/02/2018 14:36   Dg Chest Portable 1 View  Result Date: 03/04/2018 CLINICAL DATA:  Several day history of generalized weakness. Previous history of TIA-CVA with residual right-sided deficits. EXAM: PORTABLE CHEST 1 VIEW COMPARISON:  Portable chest x-ray of August 21, 2017 FINDINGS: The lungs are mildly hyperinflated. The interstitial markings are coarse though stable. There is no alveolar infiltrate or pleural effusion. The heart and pulmonary vascularity are normal. There is calcification in the wall of the aortic arch. The observed bony thorax is unremarkable. IMPRESSION: Chronic bronchitic changes, stable. No pneumonia, CHF, nor other acute cardiopulmonary abnormality. Thoracic aortic atherosclerosis. Electronically Signed   By: David  [place] M.D.   On: 03/04/2018 12:27   [place] Ekg Site Rite  Result Date: 03/11/2018 If Site Rite image not attached, placement could not be confirmed due to current cardiac rhythm.   Microbiology No results found for this or any previous visit (from the past 240 hour(s)).  Lab Basic Metabolic Panel: No results for input(s): NA, K, CL, CO2, GLUCOSE, BUN, CREATININE, CALCIUM, MG, PHOS in the last 168 hours. Liver Function Tests: No results for input(s): AST, ALT, ALKPHOS, BILITOT, PROT, ALBUMIN in the last 168 hours. No results for input(s): LIPASE, AMYLASE in the last 168 hours. No results for input(s): AMMONIA in the last 168 hours. CBC: No results for input(s): WBC, NEUTROABS, HGB, HCT, MCV, PLT in the last 168 hours. Cardiac Enzymes: No results for input(s): CKTOTAL, CKMB, CKMBINDEX, TROPONINI in the last 168 hours. Sepsis Labs: No results for input(s): PROCALCITON, WBC, LATICACIDVEN in the last 168 hours.  Procedures/Operations     Fritzi Mandes 03/25/2018, 12:47 PM

## 2018-04-01 NOTE — Care Management Important Message (Signed)
Copy of IM not given due to patient passed away this morning.

## 2018-04-01 NOTE — Progress Notes (Signed)
Informed daughter [name] about pt change in breathing  Pattern. An attempt was made to call another daughter Evlyn Kanner hurt was not successful. Per daughter [name], she will notify her sister. Information pass to oncoming nurse.

## 2018-04-01 NOTE — Progress Notes (Signed)
Chaplain was advised that the patient died this morning and that the family was currently at the bedside. Chaplain met oldest son who had traveled from [place] with change in his mother's health. Other adult children of the patient (two daughters and two sons) in the hallway outside patients room; introductions made. Chaplain expressed condolences in wake of loss of their mother  Chaplain asked if there is anything she can assist them with. Family is sad and grieving, but also indicated that they feel she is now at peace. Family had healthy sense of humor and seemed to use humor in their coping. After talking for a while with family about their mother, Chaplain asked if she could offer prayer, to which the family gladly welcomed.  Administrative Coordinator arrived while visiting to complete funeral release form. Family pastor came in shortly thereafter and offered a prayer of comfort, which the chaplain remained in the room for.

## 2018-04-01 DEATH — deceased
